# Patient Record
Sex: Male | Born: 1960 | Race: Black or African American | Hispanic: No | Marital: Single | Smoking: Current every day smoker
Health system: Southern US, Community
[De-identification: ages and names within clinical notes are randomized; demographics above are authoritative.]

## PROBLEM LIST (undated history)

## (undated) DIAGNOSIS — Z21 Asymptomatic human immunodeficiency virus [HIV] infection status: Secondary | ICD-10-CM

## (undated) DIAGNOSIS — B20 Human immunodeficiency virus [HIV] disease: Secondary | ICD-10-CM

## (undated) DIAGNOSIS — N400 Enlarged prostate without lower urinary tract symptoms: Secondary | ICD-10-CM

## (undated) DIAGNOSIS — I10 Essential (primary) hypertension: Secondary | ICD-10-CM

## (undated) DIAGNOSIS — E785 Hyperlipidemia, unspecified: Secondary | ICD-10-CM

## (undated) DIAGNOSIS — F259 Schizoaffective disorder, unspecified: Secondary | ICD-10-CM

## (undated) DIAGNOSIS — E039 Hypothyroidism, unspecified: Secondary | ICD-10-CM

---

## 2006-09-21 ENCOUNTER — Emergency Department: Payer: Self-pay | Admitting: Emergency Medicine

## 2011-02-10 ENCOUNTER — Emergency Department: Payer: Self-pay | Admitting: Emergency Medicine

## 2011-04-24 ENCOUNTER — Emergency Department: Payer: Self-pay | Admitting: Emergency Medicine

## 2011-04-24 LAB — COMPREHENSIVE METABOLIC PANEL
Albumin: 3.9 g/dL (ref 3.4–5.0)
Alkaline Phosphatase: 106 U/L (ref 50–136)
Anion Gap: 7 (ref 7–16)
BUN: 21 mg/dL — ABNORMAL HIGH (ref 7–18)
Calcium, Total: 8.3 mg/dL — ABNORMAL LOW (ref 8.5–10.1)
Creatinine: 0.97 mg/dL (ref 0.60–1.30)
Glucose: 106 mg/dL — ABNORMAL HIGH (ref 65–99)
Potassium: 3.7 mmol/L (ref 3.5–5.1)
SGOT(AST): 17 U/L (ref 15–37)
Sodium: 143 mmol/L (ref 136–145)
Total Protein: 6.7 g/dL (ref 6.4–8.2)

## 2011-04-24 LAB — URINALYSIS, COMPLETE
Bacteria: NONE SEEN
Bilirubin,UR: NEGATIVE
Blood: NEGATIVE
Glucose,UR: NEGATIVE mg/dL (ref 0–75)
Leukocyte Esterase: NEGATIVE
Nitrite: NEGATIVE
Specific Gravity: 1.024 (ref 1.003–1.030)
Squamous Epithelial: <1
WBC UR: NONE SEEN /HPF (ref 0–5)

## 2011-04-24 LAB — CBC
HGB: 13.7 g/dL (ref 13.0–18.0)
MCHC: 33.3 g/dL (ref 32.0–36.0)
RBC: 3.93 10*6/uL — ABNORMAL LOW (ref 4.40–5.90)
WBC: 2.8 10*3/uL — ABNORMAL LOW (ref 3.8–10.6)

## 2011-04-24 LAB — DRUG SCREEN, URINE
Amphetamines, Ur Screen: NEGATIVE (ref ?–1000)
Barbiturates, Ur Screen: NEGATIVE (ref ?–200)
Cocaine Metabolite,Ur ~~LOC~~: NEGATIVE (ref ?–300)
MDMA (Ecstasy)Ur Screen: NEGATIVE (ref ?–500)
Methadone, Ur Screen: NEGATIVE (ref ?–300)
Opiate, Ur Screen: NEGATIVE (ref ?–300)
Tricyclic, Ur Screen: NEGATIVE (ref ?–1000)

## 2011-04-24 LAB — ACETAMINOPHEN LEVEL: Acetaminophen: <2 ug/mL

## 2011-04-24 LAB — ETHANOL
Ethanol %: <0.003 % (ref 0.000–0.080)
Ethanol: <3 mg/dL

## 2011-04-24 LAB — TSH: Thyroid Stimulating Horm: 0.63 u[IU]/mL

## 2011-05-21 ENCOUNTER — Emergency Department: Payer: Self-pay | Admitting: Emergency Medicine

## 2011-05-21 LAB — COMPREHENSIVE METABOLIC PANEL
Anion Gap: 11 (ref 7–16)
BUN: 21 mg/dL — ABNORMAL HIGH (ref 7–18)
Calcium, Total: 8.7 mg/dL (ref 8.5–10.1)
Chloride: 106 mmol/L (ref 98–107)
Co2: 28 mmol/L (ref 21–32)
EGFR (Non-African Amer.): >60
Potassium: 4.1 mmol/L (ref 3.5–5.1)
SGOT(AST): 13 U/L — ABNORMAL LOW (ref 15–37)
SGPT (ALT): 8 U/L — ABNORMAL LOW
Sodium: 145 mmol/L (ref 136–145)
Total Protein: 6.8 g/dL (ref 6.4–8.2)

## 2011-05-21 LAB — DRUG SCREEN, URINE
Amphetamines, Ur Screen: NEGATIVE (ref ?–1000)
Cocaine Metabolite,Ur ~~LOC~~: NEGATIVE (ref ?–300)
MDMA (Ecstasy)Ur Screen: POSITIVE (ref ?–500)
Methadone, Ur Screen: NEGATIVE (ref ?–300)
Opiate, Ur Screen: NEGATIVE (ref ?–300)
Tricyclic, Ur Screen: NEGATIVE (ref ?–1000)

## 2011-05-21 LAB — ETHANOL: Ethanol: <3 mg/dL

## 2011-05-21 LAB — SALICYLATE LEVEL: Salicylates, Serum: 2.2 mg/dL

## 2011-05-21 LAB — CBC
HCT: 37.6 % — ABNORMAL LOW (ref 40.0–52.0)
HGB: 12.4 g/dL — ABNORMAL LOW (ref 13.0–18.0)
MCV: 105 fL — ABNORMAL HIGH (ref 80–100)
RBC: 3.57 10*6/uL — ABNORMAL LOW (ref 4.40–5.90)
WBC: 6.2 10*3/uL (ref 3.8–10.6)

## 2011-05-21 LAB — ACETAMINOPHEN LEVEL: Acetaminophen: <2 ug/mL

## 2011-05-21 LAB — VALPROIC ACID LEVEL: Valproic Acid: 72 ug/mL

## 2011-06-10 ENCOUNTER — Emergency Department: Payer: Self-pay | Admitting: Emergency Medicine

## 2011-06-10 LAB — COMPREHENSIVE METABOLIC PANEL
Alkaline Phosphatase: 104 U/L (ref 50–136)
Bilirubin,Total: 0.2 mg/dL (ref 0.2–1.0)
Calcium, Total: 8.3 mg/dL — ABNORMAL LOW (ref 8.5–10.1)
Chloride: 109 mmol/L — ABNORMAL HIGH (ref 98–107)
Co2: 25 mmol/L (ref 21–32)
Creatinine: 0.9 mg/dL (ref 0.60–1.30)
EGFR (African American): >60
EGFR (Non-African Amer.): >60
SGPT (ALT): 8 U/L — ABNORMAL LOW
Total Protein: 7 g/dL (ref 6.4–8.2)

## 2011-06-10 LAB — DRUG SCREEN, URINE
Barbiturates, Ur Screen: NEGATIVE (ref ?–200)
Benzodiazepine, Ur Scrn: NEGATIVE (ref ?–200)
Cannabinoid 50 Ng, Ur ~~LOC~~: NEGATIVE (ref ?–50)
Methadone, Ur Screen: NEGATIVE (ref ?–300)
Opiate, Ur Screen: NEGATIVE (ref ?–300)
Phencyclidine (PCP) Ur S: NEGATIVE (ref ?–25)
Tricyclic, Ur Screen: NEGATIVE (ref ?–1000)

## 2011-06-10 LAB — CBC
HCT: 38.1 % — ABNORMAL LOW (ref 40.0–52.0)
HGB: 12.8 g/dL — ABNORMAL LOW (ref 13.0–18.0)
MCH: 34.4 pg — ABNORMAL HIGH (ref 26.0–34.0)
MCHC: 33.5 g/dL (ref 32.0–36.0)
RBC: 3.71 10*6/uL — ABNORMAL LOW (ref 4.40–5.90)

## 2011-06-10 LAB — ACETAMINOPHEN LEVEL: Acetaminophen: <2 ug/mL

## 2011-06-10 LAB — TSH: Thyroid Stimulating Horm: 0.41 u[IU]/mL — ABNORMAL LOW

## 2011-06-10 LAB — SALICYLATE LEVEL: Salicylates, Serum: <1.7 mg/dL

## 2011-06-29 ENCOUNTER — Emergency Department: Payer: Self-pay | Admitting: Emergency Medicine

## 2011-06-30 LAB — DRUG SCREEN, URINE
Barbiturates, Ur Screen: NEGATIVE (ref ?–200)
Benzodiazepine, Ur Scrn: NEGATIVE (ref ?–200)
Cocaine Metabolite,Ur ~~LOC~~: NEGATIVE (ref ?–300)
MDMA (Ecstasy)Ur Screen: POSITIVE (ref ?–500)
Methadone, Ur Screen: NEGATIVE (ref ?–300)
Phencyclidine (PCP) Ur S: NEGATIVE (ref ?–25)
Tricyclic, Ur Screen: NEGATIVE (ref ?–1000)

## 2011-06-30 LAB — COMPREHENSIVE METABOLIC PANEL
Albumin: 3.3 g/dL — ABNORMAL LOW (ref 3.4–5.0)
Chloride: 110 mmol/L — ABNORMAL HIGH (ref 98–107)
Co2: 26 mmol/L (ref 21–32)
Creatinine: 1.06 mg/dL (ref 0.60–1.30)
EGFR (African American): >60
Glucose: 123 mg/dL — ABNORMAL HIGH (ref 65–99)
SGOT(AST): 22 U/L (ref 15–37)
Sodium: 144 mmol/L (ref 136–145)
Total Protein: 5.9 g/dL — ABNORMAL LOW (ref 6.4–8.2)

## 2011-06-30 LAB — SALICYLATE LEVEL: Salicylates, Serum: <1.7 mg/dL

## 2011-06-30 LAB — VALPROIC ACID LEVEL: Valproic Acid: 6 ug/mL — ABNORMAL LOW

## 2011-06-30 LAB — TSH: Thyroid Stimulating Horm: 1.49 u[IU]/mL

## 2011-06-30 LAB — ETHANOL: Ethanol: <3 mg/dL

## 2011-06-30 LAB — CBC
HCT: 34.1 % — ABNORMAL LOW (ref 40.0–52.0)
HGB: 11.5 g/dL — ABNORMAL LOW (ref 13.0–18.0)
MCHC: 33.8 g/dL (ref 32.0–36.0)
MCV: 103 fL — ABNORMAL HIGH (ref 80–100)
RBC: 3.31 10*6/uL — ABNORMAL LOW (ref 4.40–5.90)
WBC: 4.4 10*3/uL (ref 3.8–10.6)

## 2011-06-30 LAB — ACETAMINOPHEN LEVEL: Acetaminophen: 8 ug/mL — ABNORMAL LOW

## 2011-07-09 ENCOUNTER — Emergency Department: Payer: Self-pay | Admitting: Emergency Medicine

## 2011-07-09 LAB — URINALYSIS, COMPLETE
Bacteria: NONE SEEN
Blood: NEGATIVE
Glucose,UR: NEGATIVE mg/dL (ref 0–75)
Nitrite: NEGATIVE
Ph: 5 (ref 4.5–8.0)
Protein: NEGATIVE
Specific Gravity: 1.021 (ref 1.003–1.030)
Squamous Epithelial: <1
WBC UR: 27 /HPF (ref 0–5)

## 2011-07-09 LAB — DRUG SCREEN, URINE
Barbiturates, Ur Screen: NEGATIVE (ref ?–200)
Cocaine Metabolite,Ur ~~LOC~~: NEGATIVE (ref ?–300)
Methadone, Ur Screen: NEGATIVE (ref ?–300)

## 2011-07-09 LAB — CBC
HGB: 13 g/dL (ref 13.0–18.0)
MCH: 33.6 pg (ref 26.0–34.0)
MCHC: 32.5 g/dL (ref 32.0–36.0)
Platelet: 218 10*3/uL (ref 150–440)
RBC: 3.86 10*6/uL — ABNORMAL LOW (ref 4.40–5.90)
RDW: 14.7 % — ABNORMAL HIGH (ref 11.5–14.5)
WBC: 5.2 10*3/uL (ref 3.8–10.6)

## 2011-07-09 LAB — COMPREHENSIVE METABOLIC PANEL
Albumin: 3.7 g/dL (ref 3.4–5.0)
Alkaline Phosphatase: 123 U/L (ref 50–136)
Bilirubin,Total: 0.2 mg/dL (ref 0.2–1.0)
Calcium, Total: 8.4 mg/dL — ABNORMAL LOW (ref 8.5–10.1)
Chloride: 104 mmol/L (ref 98–107)
Co2: 27 mmol/L (ref 21–32)
Creatinine: 1.02 mg/dL (ref 0.60–1.30)
EGFR (African American): >60
EGFR (Non-African Amer.): >60
Glucose: 124 mg/dL — ABNORMAL HIGH (ref 65–99)
Osmolality: 281 (ref 275–301)
Potassium: 4.1 mmol/L (ref 3.5–5.1)
SGPT (ALT): 8 U/L — ABNORMAL LOW
Sodium: 139 mmol/L (ref 136–145)
Total Protein: 7 g/dL (ref 6.4–8.2)

## 2011-07-09 LAB — VALPROIC ACID LEVEL: Valproic Acid: 85 ug/mL

## 2011-07-09 LAB — ETHANOL
Ethanol %: <0.003 % (ref 0.000–0.080)
Ethanol: <3 mg/dL

## 2011-07-10 ENCOUNTER — Emergency Department: Payer: Self-pay | Admitting: Emergency Medicine

## 2011-07-10 LAB — COMPREHENSIVE METABOLIC PANEL
Albumin: 3.7 g/dL (ref 3.4–5.0)
Anion Gap: 5 — ABNORMAL LOW (ref 7–16)
BUN: 24 mg/dL — ABNORMAL HIGH (ref 7–18)
Bilirubin,Total: 0.2 mg/dL (ref 0.2–1.0)
Calcium, Total: 8.4 mg/dL — ABNORMAL LOW (ref 8.5–10.1)
Creatinine: 0.93 mg/dL (ref 0.60–1.30)
EGFR (African American): >60
EGFR (Non-African Amer.): >60
Potassium: 4.4 mmol/L (ref 3.5–5.1)
SGPT (ALT): 10 U/L — ABNORMAL LOW
Sodium: 139 mmol/L (ref 136–145)
Total Protein: 7.2 g/dL (ref 6.4–8.2)

## 2011-07-10 LAB — CBC
MCH: 33.8 pg (ref 26.0–34.0)
MCHC: 32.5 g/dL (ref 32.0–36.0)
MCV: 104 fL — ABNORMAL HIGH (ref 80–100)
Platelet: 238 10*3/uL (ref 150–440)
WBC: 4.2 10*3/uL (ref 3.8–10.6)

## 2011-07-10 LAB — ETHANOL: Ethanol %: <0.003 % (ref 0.000–0.080)

## 2011-07-10 LAB — DRUG SCREEN, URINE
Amphetamines, Ur Screen: NEGATIVE (ref ?–1000)
Benzodiazepine, Ur Scrn: NEGATIVE (ref ?–200)
Cannabinoid 50 Ng, Ur ~~LOC~~: NEGATIVE (ref ?–50)
Cocaine Metabolite,Ur ~~LOC~~: NEGATIVE (ref ?–300)
MDMA (Ecstasy)Ur Screen: NEGATIVE (ref ?–500)
Methadone, Ur Screen: NEGATIVE (ref ?–300)
Phencyclidine (PCP) Ur S: NEGATIVE (ref ?–25)
Tricyclic, Ur Screen: NEGATIVE (ref ?–1000)

## 2011-07-10 LAB — ACETAMINOPHEN LEVEL: Acetaminophen: <2 ug/mL

## 2011-07-10 LAB — SALICYLATE LEVEL: Salicylates, Serum: <1.7 mg/dL

## 2011-07-10 LAB — TSH: Thyroid Stimulating Horm: 0.53 u[IU]/mL

## 2011-09-03 ENCOUNTER — Emergency Department: Payer: Self-pay | Admitting: Emergency Medicine

## 2011-09-03 LAB — DRUG SCREEN, URINE
Amphetamines, Ur Screen: NEGATIVE (ref ?–1000)
Cocaine Metabolite,Ur ~~LOC~~: NEGATIVE (ref ?–300)
MDMA (Ecstasy)Ur Screen: POSITIVE (ref ?–500)
Phencyclidine (PCP) Ur S: NEGATIVE (ref ?–25)
Tricyclic, Ur Screen: NEGATIVE (ref ?–1000)

## 2011-09-03 LAB — CBC
HCT: 40 % (ref 40.0–52.0)
HGB: 13.2 g/dL (ref 13.0–18.0)
MCH: 34.4 pg — ABNORMAL HIGH (ref 26.0–34.0)
MCHC: 33 g/dL (ref 32.0–36.0)
Platelet: 209 10*3/uL (ref 150–440)
RBC: 3.83 10*6/uL — ABNORMAL LOW (ref 4.40–5.90)
RDW: 14 % (ref 11.5–14.5)
WBC: 4.3 10*3/uL (ref 3.8–10.6)

## 2011-09-03 LAB — ETHANOL: Ethanol %: <0.003 % (ref 0.000–0.080)

## 2011-09-03 LAB — SALICYLATE LEVEL: Salicylates, Serum: <1.7 mg/dL

## 2011-09-03 LAB — COMPREHENSIVE METABOLIC PANEL
Alkaline Phosphatase: 116 U/L (ref 50–136)
Anion Gap: 4 — ABNORMAL LOW (ref 7–16)
BUN: 13 mg/dL (ref 7–18)
Calcium, Total: 8.5 mg/dL (ref 8.5–10.1)
Chloride: 109 mmol/L — ABNORMAL HIGH (ref 98–107)
Creatinine: 1.01 mg/dL (ref 0.60–1.30)
Osmolality: 279 (ref 275–301)
SGPT (ALT): 11 U/L — ABNORMAL LOW
Sodium: 140 mmol/L (ref 136–145)
Total Protein: 6.5 g/dL (ref 6.4–8.2)

## 2011-09-03 LAB — TSH: Thyroid Stimulating Horm: 0.951 u[IU]/mL

## 2011-09-30 ENCOUNTER — Emergency Department: Payer: Self-pay | Admitting: Emergency Medicine

## 2011-09-30 LAB — COMPREHENSIVE METABOLIC PANEL
Albumin: 3.4 g/dL (ref 3.4–5.0)
Anion Gap: 8 (ref 7–16)
BUN: 14 mg/dL (ref 7–18)
Chloride: 108 mmol/L — ABNORMAL HIGH (ref 98–107)
Co2: 29 mmol/L (ref 21–32)
EGFR (Non-African Amer.): >60
Osmolality: 290 (ref 275–301)
Potassium: 3.8 mmol/L (ref 3.5–5.1)
SGPT (ALT): 8 U/L — ABNORMAL LOW
Sodium: 145 mmol/L (ref 136–145)

## 2011-09-30 LAB — CBC
HCT: 40.3 % (ref 40.0–52.0)
MCHC: 33.3 g/dL (ref 32.0–36.0)
MCV: 102 fL — ABNORMAL HIGH (ref 80–100)
Platelet: 219 10*3/uL (ref 150–440)
RDW: 13.5 % (ref 11.5–14.5)
WBC: 3.7 10*3/uL — ABNORMAL LOW (ref 3.8–10.6)

## 2011-09-30 LAB — ETHANOL
Ethanol %: <0.003 % (ref 0.000–0.080)
Ethanol: <3 mg/dL

## 2011-09-30 LAB — URINALYSIS, COMPLETE
Bilirubin,UR: NEGATIVE
Ph: 6 (ref 4.5–8.0)
Protein: NEGATIVE
RBC,UR: 3 /HPF (ref 0–5)
Specific Gravity: 1.016 (ref 1.003–1.030)
Squamous Epithelial: <1
WBC UR: <1 /HPF (ref 0–5)

## 2011-09-30 LAB — DRUG SCREEN, URINE
Amphetamines, Ur Screen: NEGATIVE (ref ?–1000)
Benzodiazepine, Ur Scrn: NEGATIVE (ref ?–200)
Cannabinoid 50 Ng, Ur ~~LOC~~: NEGATIVE (ref ?–50)
Cocaine Metabolite,Ur ~~LOC~~: NEGATIVE (ref ?–300)
Methadone, Ur Screen: NEGATIVE (ref ?–300)
Opiate, Ur Screen: NEGATIVE (ref ?–300)
Phencyclidine (PCP) Ur S: NEGATIVE (ref ?–25)

## 2011-09-30 LAB — LITHIUM LEVEL: Lithium: <0.2 mmol/L — ABNORMAL LOW

## 2011-09-30 LAB — VALPROIC ACID LEVEL: Valproic Acid: 74 ug/mL

## 2011-10-06 LAB — BASIC METABOLIC PANEL
Anion Gap: 8 (ref 7–16)
Calcium, Total: 8.8 mg/dL (ref 8.5–10.1)
Chloride: 105 mmol/L (ref 98–107)
Co2: 29 mmol/L (ref 21–32)
EGFR (African American): >60
EGFR (Non-African Amer.): >60
Glucose: 96 mg/dL (ref 65–99)
Osmolality: 283 (ref 275–301)
Sodium: 142 mmol/L (ref 136–145)

## 2011-10-13 LAB — LITHIUM LEVEL: Lithium: 0.55 mmol/L — ABNORMAL LOW

## 2011-10-13 LAB — BASIC METABOLIC PANEL
BUN: 15 mg/dL (ref 7–18)
Calcium, Total: 8.8 mg/dL (ref 8.5–10.1)
Chloride: 109 mmol/L — ABNORMAL HIGH (ref 98–107)
Creatinine: 1 mg/dL (ref 0.60–1.30)
EGFR (African American): >60
EGFR (Non-African Amer.): >60
Glucose: 72 mg/dL (ref 65–99)
Potassium: 4.1 mmol/L (ref 3.5–5.1)
Sodium: 142 mmol/L (ref 136–145)

## 2011-10-13 LAB — VALPROIC ACID LEVEL: Valproic Acid: 59 ug/mL

## 2011-10-21 LAB — BASIC METABOLIC PANEL
BUN: 14 mg/dL (ref 7–18)
Calcium, Total: 9 mg/dL (ref 8.5–10.1)
Creatinine: 1.09 mg/dL (ref 0.60–1.30)
EGFR (African American): >60
EGFR (Non-African Amer.): >60
Glucose: 87 mg/dL (ref 65–99)
Potassium: 4.1 mmol/L (ref 3.5–5.1)
Sodium: 144 mmol/L (ref 136–145)

## 2011-10-21 LAB — LITHIUM LEVEL: Lithium: 0.59 mmol/L — ABNORMAL LOW

## 2012-02-04 ENCOUNTER — Emergency Department: Payer: Self-pay | Admitting: Emergency Medicine

## 2012-02-05 LAB — CBC
HCT: 37.7 % — ABNORMAL LOW (ref 40.0–52.0)
HGB: 12.9 g/dL — ABNORMAL LOW (ref 13.0–18.0)
MCH: 35.4 pg — ABNORMAL HIGH (ref 26.0–34.0)
MCHC: 34.2 g/dL (ref 32.0–36.0)
MCV: 103 fL — ABNORMAL HIGH (ref 80–100)
Platelet: 190 10*3/uL (ref 150–440)
RBC: 3.65 10*6/uL — ABNORMAL LOW (ref 4.40–5.90)
RDW: 13.1 % (ref 11.5–14.5)
WBC: 4.5 10*3/uL (ref 3.8–10.6)

## 2012-02-05 LAB — ETHANOL
Ethanol %: <0.003 % (ref 0.000–0.080)
Ethanol: <3 mg/dL

## 2012-02-05 LAB — COMPREHENSIVE METABOLIC PANEL
Albumin: 3 g/dL — ABNORMAL LOW (ref 3.4–5.0)
Anion Gap: 4 — ABNORMAL LOW (ref 7–16)
BUN: 8 mg/dL (ref 7–18)
Bilirubin,Total: 0.2 mg/dL (ref 0.2–1.0)
Chloride: 109 mmol/L — ABNORMAL HIGH (ref 98–107)
Co2: 28 mmol/L (ref 21–32)
Creatinine: 0.91 mg/dL (ref 0.60–1.30)
EGFR (African American): >60
EGFR (Non-African Amer.): >60
Osmolality: 280 (ref 275–301)
Potassium: 3.6 mmol/L (ref 3.5–5.1)
SGPT (ALT): 8 U/L — ABNORMAL LOW (ref 12–78)

## 2012-02-05 LAB — DRUG SCREEN, URINE
Amphetamines, Ur Screen: NEGATIVE (ref ?–1000)
Barbiturates, Ur Screen: NEGATIVE (ref ?–200)
Benzodiazepine, Ur Scrn: NEGATIVE (ref ?–200)
MDMA (Ecstasy)Ur Screen: NEGATIVE (ref ?–500)
Methadone, Ur Screen: NEGATIVE (ref ?–300)
Phencyclidine (PCP) Ur S: NEGATIVE (ref ?–25)
Tricyclic, Ur Screen: NEGATIVE (ref ?–1000)

## 2012-02-05 LAB — LITHIUM LEVEL: Lithium: 0.48 mmol/L — ABNORMAL LOW

## 2012-02-05 LAB — SALICYLATE LEVEL: Salicylates, Serum: 2.1 mg/dL

## 2012-02-05 LAB — VALPROIC ACID LEVEL: Valproic Acid: 29 ug/mL — ABNORMAL LOW

## 2012-02-05 LAB — ACETAMINOPHEN LEVEL: Acetaminophen: 2 ug/mL — ABNORMAL LOW

## 2012-05-27 ENCOUNTER — Emergency Department: Payer: Self-pay | Admitting: Emergency Medicine

## 2012-05-27 LAB — DRUG SCREEN, URINE
Amphetamines, Ur Screen: NEGATIVE (ref ?–1000)
Benzodiazepine, Ur Scrn: POSITIVE (ref ?–200)
Cannabinoid 50 Ng, Ur ~~LOC~~: NEGATIVE (ref ?–50)
Cocaine Metabolite,Ur ~~LOC~~: NEGATIVE (ref ?–300)
MDMA (Ecstasy)Ur Screen: NEGATIVE (ref ?–500)
Methadone, Ur Screen: NEGATIVE (ref ?–300)
Opiate, Ur Screen: NEGATIVE (ref ?–300)
Phencyclidine (PCP) Ur S: NEGATIVE (ref ?–25)

## 2012-05-27 LAB — CBC
HCT: 41.3 % (ref 40.0–52.0)
HGB: 13.7 g/dL (ref 13.0–18.0)
MCV: 107 fL — ABNORMAL HIGH (ref 80–100)
Platelet: 216 10*3/uL (ref 150–440)
RBC: 3.86 10*6/uL — ABNORMAL LOW (ref 4.40–5.90)
RDW: 13.7 % (ref 11.5–14.5)
WBC: 4.1 10*3/uL (ref 3.8–10.6)

## 2012-05-27 LAB — TSH: Thyroid Stimulating Horm: 2.19 u[IU]/mL

## 2012-05-27 LAB — URINALYSIS, COMPLETE
Bacteria: NONE SEEN
Blood: NEGATIVE
Glucose,UR: NEGATIVE mg/dL (ref 0–75)
Ketone: NEGATIVE
Leukocyte Esterase: NEGATIVE
Nitrite: NEGATIVE
Ph: 7 (ref 4.5–8.0)
Protein: NEGATIVE
RBC,UR: <1 /HPF (ref 0–5)
Specific Gravity: 1.01 (ref 1.003–1.030)

## 2012-05-27 LAB — COMPREHENSIVE METABOLIC PANEL
Alkaline Phosphatase: 110 U/L (ref 50–136)
Chloride: 106 mmol/L (ref 98–107)
Creatinine: 1.07 mg/dL (ref 0.60–1.30)
EGFR (African American): >60
Potassium: 3.4 mmol/L — ABNORMAL LOW (ref 3.5–5.1)
SGOT(AST): 15 U/L (ref 15–37)
SGPT (ALT): 10 U/L — ABNORMAL LOW (ref 12–78)
Sodium: 139 mmol/L (ref 136–145)
Total Protein: 7 g/dL (ref 6.4–8.2)

## 2012-06-17 ENCOUNTER — Emergency Department: Payer: Self-pay | Admitting: Emergency Medicine

## 2012-06-17 LAB — DRUG SCREEN, URINE
Barbiturates, Ur Screen: NEGATIVE (ref ?–200)
Cannabinoid 50 Ng, Ur ~~LOC~~: NEGATIVE (ref ?–50)
MDMA (Ecstasy)Ur Screen: NEGATIVE (ref ?–500)
Methadone, Ur Screen: NEGATIVE (ref ?–300)
Phencyclidine (PCP) Ur S: NEGATIVE (ref ?–25)
Tricyclic, Ur Screen: NEGATIVE (ref ?–1000)

## 2012-06-17 LAB — ETHANOL
Ethanol %: <0.003 % (ref 0.000–0.080)
Ethanol: <3 mg/dL

## 2012-06-17 LAB — COMPREHENSIVE METABOLIC PANEL
Albumin: 3.5 g/dL (ref 3.4–5.0)
Anion Gap: 3 — ABNORMAL LOW (ref 7–16)
Bilirubin,Total: 0.3 mg/dL (ref 0.2–1.0)
Calcium, Total: 8.4 mg/dL — ABNORMAL LOW (ref 8.5–10.1)
Co2: 29 mmol/L (ref 21–32)
Creatinine: 0.91 mg/dL (ref 0.60–1.30)
EGFR (Non-African Amer.): >60
SGPT (ALT): 11 U/L — ABNORMAL LOW (ref 12–78)
Sodium: 142 mmol/L (ref 136–145)
Total Protein: 6.8 g/dL (ref 6.4–8.2)

## 2012-06-17 LAB — URINALYSIS, COMPLETE
Bilirubin,UR: NEGATIVE
Blood: NEGATIVE
Nitrite: NEGATIVE
Squamous Epithelial: <1
WBC UR: 1 /HPF (ref 0–5)

## 2012-06-17 LAB — CBC
HCT: 39.9 % — ABNORMAL LOW (ref 40.0–52.0)
HGB: 13.5 g/dL (ref 13.0–18.0)
RBC: 3.76 10*6/uL — ABNORMAL LOW (ref 4.40–5.90)
RDW: 12.9 % (ref 11.5–14.5)
WBC: 5.3 10*3/uL (ref 3.8–10.6)

## 2012-06-19 ENCOUNTER — Emergency Department: Payer: Self-pay | Admitting: Emergency Medicine

## 2012-06-19 LAB — DRUG SCREEN, URINE
Amphetamines, Ur Screen: NEGATIVE (ref ?–1000)
Barbiturates, Ur Screen: NEGATIVE (ref ?–200)
Benzodiazepine, Ur Scrn: POSITIVE (ref ?–200)
Cocaine Metabolite,Ur ~~LOC~~: NEGATIVE (ref ?–300)
MDMA (Ecstasy)Ur Screen: NEGATIVE (ref ?–500)
Methadone, Ur Screen: NEGATIVE (ref ?–300)
Opiate, Ur Screen: NEGATIVE (ref ?–300)
Phencyclidine (PCP) Ur S: NEGATIVE (ref ?–25)

## 2012-06-19 LAB — COMPREHENSIVE METABOLIC PANEL
Alkaline Phosphatase: 93 U/L (ref 50–136)
Anion Gap: 3 — ABNORMAL LOW (ref 7–16)
Bilirubin,Total: 0.3 mg/dL (ref 0.2–1.0)
Calcium, Total: 8.6 mg/dL (ref 8.5–10.1)
Chloride: 108 mmol/L — ABNORMAL HIGH (ref 98–107)
Co2: 29 mmol/L (ref 21–32)
Creatinine: 0.9 mg/dL (ref 0.60–1.30)
EGFR (African American): >60
EGFR (Non-African Amer.): >60
Glucose: 104 mg/dL — ABNORMAL HIGH (ref 65–99)
Osmolality: 280 (ref 275–301)
Potassium: 3.8 mmol/L (ref 3.5–5.1)
SGOT(AST): 15 U/L (ref 15–37)
Total Protein: 7.2 g/dL (ref 6.4–8.2)

## 2012-06-19 LAB — URINALYSIS, COMPLETE
Bilirubin,UR: NEGATIVE
Glucose,UR: NEGATIVE mg/dL (ref 0–75)
Specific Gravity: 1.015 (ref 1.003–1.030)
WBC UR: 4 /HPF (ref 0–5)

## 2012-06-19 LAB — CBC
MCH: 35.1 pg — ABNORMAL HIGH (ref 26.0–34.0)
MCV: 106 fL — ABNORMAL HIGH (ref 80–100)
Platelet: 205 10*3/uL (ref 150–440)
RBC: 3.82 10*6/uL — ABNORMAL LOW (ref 4.40–5.90)
RDW: 12.8 % (ref 11.5–14.5)
WBC: 6.5 10*3/uL (ref 3.8–10.6)

## 2012-09-27 ENCOUNTER — Emergency Department: Payer: Self-pay | Admitting: Emergency Medicine

## 2012-09-27 LAB — COMPREHENSIVE METABOLIC PANEL
Albumin: 3.4 g/dL (ref 3.4–5.0)
BUN: 8 mg/dL (ref 7–18)
Bilirubin,Total: 0.2 mg/dL (ref 0.2–1.0)
Calcium, Total: 8.7 mg/dL (ref 8.5–10.1)
Co2: 29 mmol/L (ref 21–32)
Creatinine: 0.92 mg/dL (ref 0.60–1.30)
EGFR (African American): >60
EGFR (Non-African Amer.): >60
Osmolality: 283 (ref 275–301)
Potassium: 3.9 mmol/L (ref 3.5–5.1)
Sodium: 142 mmol/L (ref 136–145)

## 2012-09-27 LAB — URINALYSIS, COMPLETE
Bilirubin,UR: NEGATIVE
Glucose,UR: NEGATIVE mg/dL (ref 0–75)
Ketone: NEGATIVE
Leukocyte Esterase: NEGATIVE
Ph: 7 (ref 4.5–8.0)
Specific Gravity: 1.008 (ref 1.003–1.030)
Squamous Epithelial: <1

## 2012-09-27 LAB — DRUG SCREEN, URINE
Amphetamines, Ur Screen: NEGATIVE (ref ?–1000)
Barbiturates, Ur Screen: NEGATIVE (ref ?–200)
Cocaine Metabolite,Ur ~~LOC~~: NEGATIVE (ref ?–300)
MDMA (Ecstasy)Ur Screen: NEGATIVE (ref ?–500)
Methadone, Ur Screen: NEGATIVE (ref ?–300)
Phencyclidine (PCP) Ur S: NEGATIVE (ref ?–25)
Tricyclic, Ur Screen: NEGATIVE (ref ?–1000)

## 2012-09-27 LAB — ETHANOL: Ethanol: <3 mg/dL

## 2012-09-27 LAB — CBC
HCT: 38.9 % — ABNORMAL LOW (ref 40.0–52.0)
HGB: 13 g/dL (ref 13.0–18.0)
MCH: 34.5 pg — ABNORMAL HIGH (ref 26.0–34.0)
RBC: 3.78 10*6/uL — ABNORMAL LOW (ref 4.40–5.90)

## 2012-09-28 LAB — VALPROIC ACID LEVEL: Valproic Acid: 74 ug/mL

## 2012-10-05 ENCOUNTER — Emergency Department: Payer: Self-pay | Admitting: Emergency Medicine

## 2012-10-05 LAB — CBC
HGB: 13.9 g/dL (ref 13.0–18.0)
MCH: 34.7 pg — ABNORMAL HIGH (ref 26.0–34.0)
MCHC: 33.9 g/dL (ref 32.0–36.0)
Platelet: 236 10*3/uL (ref 150–440)
RBC: 4 10*6/uL — ABNORMAL LOW (ref 4.40–5.90)

## 2012-10-05 LAB — COMPREHENSIVE METABOLIC PANEL
Albumin: 4.1 g/dL (ref 3.4–5.0)
Alkaline Phosphatase: 84 U/L (ref 50–136)
BUN: 11 mg/dL (ref 7–18)
Bilirubin,Total: 0.8 mg/dL (ref 0.2–1.0)
Calcium, Total: 9 mg/dL (ref 8.5–10.1)
Co2: 28 mmol/L (ref 21–32)
Creatinine: 1.02 mg/dL (ref 0.60–1.30)
EGFR (Non-African Amer.): >60
Glucose: 83 mg/dL (ref 65–99)
Sodium: 136 mmol/L (ref 136–145)

## 2012-10-05 LAB — DRUG SCREEN, URINE
Barbiturates, Ur Screen: NEGATIVE (ref ?–200)
Benzodiazepine, Ur Scrn: POSITIVE (ref ?–200)
MDMA (Ecstasy)Ur Screen: NEGATIVE (ref ?–500)
Methadone, Ur Screen: NEGATIVE (ref ?–300)
Opiate, Ur Screen: NEGATIVE (ref ?–300)
Tricyclic, Ur Screen: NEGATIVE (ref ?–1000)

## 2012-10-05 LAB — ACETAMINOPHEN LEVEL: Acetaminophen: <2 ug/mL

## 2012-10-05 LAB — ETHANOL
Ethanol %: <0.003 % (ref 0.000–0.080)
Ethanol: <3 mg/dL

## 2012-10-05 LAB — SALICYLATE LEVEL: Salicylates, Serum: <1.7 mg/dL

## 2013-03-23 ENCOUNTER — Emergency Department: Payer: Self-pay | Admitting: Emergency Medicine

## 2013-03-23 LAB — COMPREHENSIVE METABOLIC PANEL
ALBUMIN: 3.6 g/dL (ref 3.4–5.0)
ALK PHOS: 100 U/L
ALT: 11 U/L — AB (ref 12–78)
ANION GAP: 4 — AB (ref 7–16)
BILIRUBIN TOTAL: 0.2 mg/dL (ref 0.2–1.0)
BUN: 11 mg/dL (ref 7–18)
CHLORIDE: 106 mmol/L (ref 98–107)
CO2: 28 mmol/L (ref 21–32)
Calcium, Total: 9 mg/dL (ref 8.5–10.1)
Creatinine: 1.11 mg/dL (ref 0.60–1.30)
Glucose: 104 mg/dL — ABNORMAL HIGH (ref 65–99)
Osmolality: 275 (ref 275–301)
Potassium: 3.9 mmol/L (ref 3.5–5.1)
SGOT(AST): 19 U/L (ref 15–37)
Sodium: 138 mmol/L (ref 136–145)
Total Protein: 7 g/dL (ref 6.4–8.2)

## 2013-03-23 LAB — SALICYLATE LEVEL: Salicylates, Serum: 2 mg/dL

## 2013-03-23 LAB — DRUG SCREEN, URINE

## 2013-03-23 LAB — CBC
HCT: 41.5 % (ref 40.0–52.0)
HGB: 14 g/dL (ref 13.0–18.0)
MCH: 35.6 pg — ABNORMAL HIGH (ref 26.0–34.0)
MCHC: 33.9 g/dL (ref 32.0–36.0)
MCV: 105 fL — ABNORMAL HIGH (ref 80–100)
PLATELETS: 203 10*3/uL (ref 150–440)
RBC: 3.94 10*6/uL — AB (ref 4.40–5.90)
RDW: 13.6 % (ref 11.5–14.5)
WBC: 5 10*3/uL (ref 3.8–10.6)

## 2013-03-23 LAB — ACETAMINOPHEN LEVEL: Acetaminophen: <2 ug/mL

## 2013-03-23 LAB — ETHANOL: Ethanol %: <0.003 % (ref 0.000–0.080)

## 2013-03-23 LAB — TSH: THYROID STIMULATING HORM: 1.97 u[IU]/mL

## 2013-12-30 ENCOUNTER — Emergency Department: Payer: Self-pay | Admitting: Emergency Medicine

## 2013-12-30 LAB — DRUG SCREEN, URINE
AMPHETAMINES, UR SCREEN: NEGATIVE (ref ?–1000)
BENZODIAZEPINE, UR SCRN: POSITIVE (ref ?–200)
Barbiturates, Ur Screen: NEGATIVE (ref ?–200)
CANNABINOID 50 NG, UR ~~LOC~~: NEGATIVE (ref ?–50)
Cocaine Metabolite,Ur ~~LOC~~: NEGATIVE (ref ?–300)
MDMA (Ecstasy)Ur Screen: NEGATIVE (ref ?–500)
Methadone, Ur Screen: NEGATIVE (ref ?–300)
Opiate, Ur Screen: NEGATIVE (ref ?–300)
PHENCYCLIDINE (PCP) UR S: NEGATIVE (ref ?–25)
Tricyclic, Ur Screen: NEGATIVE (ref ?–1000)

## 2013-12-30 LAB — URINALYSIS, COMPLETE
BACTERIA: NONE SEEN
Bilirubin,UR: NEGATIVE
Blood: NEGATIVE
GLUCOSE, UR: NEGATIVE mg/dL (ref 0–75)
Ketone: NEGATIVE
Leukocyte Esterase: NEGATIVE
NITRITE: NEGATIVE
Ph: 7 (ref 4.5–8.0)
Protein: NEGATIVE
RBC,UR: 1 /HPF (ref 0–5)
SQUAMOUS EPITHELIAL: NONE SEEN
Specific Gravity: 1.006 (ref 1.003–1.030)

## 2013-12-30 LAB — COMPREHENSIVE METABOLIC PANEL
ALBUMIN: 3.3 g/dL — AB (ref 3.4–5.0)
ALK PHOS: 87 U/L
Anion Gap: 5 — ABNORMAL LOW (ref 7–16)
BUN: 7 mg/dL (ref 7–18)
Bilirubin,Total: 0.2 mg/dL (ref 0.2–1.0)
CALCIUM: 7.9 mg/dL — AB (ref 8.5–10.1)
CO2: 27 mmol/L (ref 21–32)
CREATININE: 0.99 mg/dL (ref 0.60–1.30)
Chloride: 110 mmol/L — ABNORMAL HIGH (ref 98–107)
EGFR (African American): >60
EGFR (Non-African Amer.): >60
Glucose: 103 mg/dL — ABNORMAL HIGH (ref 65–99)
Osmolality: 281 (ref 275–301)
Potassium: 3.7 mmol/L (ref 3.5–5.1)
SGOT(AST): 25 U/L (ref 15–37)
SGPT (ALT): 8 U/L — ABNORMAL LOW
Sodium: 142 mmol/L (ref 136–145)
TOTAL PROTEIN: 6.5 g/dL (ref 6.4–8.2)

## 2013-12-30 LAB — SALICYLATE LEVEL: Salicylates, Serum: <1.7 mg/dL

## 2013-12-30 LAB — ETHANOL

## 2013-12-30 LAB — CBC
HCT: 40.2 % (ref 40.0–52.0)
HGB: 13.4 g/dL (ref 13.0–18.0)
MCH: 35.9 pg — ABNORMAL HIGH (ref 26.0–34.0)
MCHC: 33.4 g/dL (ref 32.0–36.0)
MCV: 108 fL — ABNORMAL HIGH (ref 80–100)
Platelet: 237 10*3/uL (ref 150–440)
RBC: 3.74 10*6/uL — ABNORMAL LOW (ref 4.40–5.90)
RDW: 12.7 % (ref 11.5–14.5)
WBC: 5.3 10*3/uL (ref 3.8–10.6)

## 2013-12-30 LAB — ACETAMINOPHEN LEVEL: Acetaminophen: <2 ug/mL

## 2013-12-31 LAB — LITHIUM LEVEL: Lithium: 1.03 mmol/L

## 2013-12-31 LAB — VALPROIC ACID LEVEL: Valproic Acid: 83 ug/mL

## 2014-01-31 LAB — CBC
HCT: 40 % (ref 40.0–52.0)
HGB: 13.1 g/dL (ref 13.0–18.0)
MCH: 35.3 pg — ABNORMAL HIGH (ref 26.0–34.0)
MCHC: 32.7 g/dL (ref 32.0–36.0)
MCV: 108 fL — ABNORMAL HIGH (ref 80–100)
Platelet: 290 10*3/uL (ref 150–440)
RBC: 3.71 10*6/uL — ABNORMAL LOW (ref 4.40–5.90)
RDW: 12.7 % (ref 11.5–14.5)
WBC: 6.7 10*3/uL (ref 3.8–10.6)

## 2014-01-31 LAB — COMPREHENSIVE METABOLIC PANEL
ALT: 10 U/L — AB
AST: 21 U/L (ref 15–37)
Albumin: 3.5 g/dL (ref 3.4–5.0)
Alkaline Phosphatase: 95 U/L
Anion Gap: 6 — ABNORMAL LOW (ref 7–16)
BUN: 10 mg/dL (ref 7–18)
Bilirubin,Total: 0.3 mg/dL (ref 0.2–1.0)
CO2: 28 mmol/L (ref 21–32)
Calcium, Total: 8.4 mg/dL — ABNORMAL LOW (ref 8.5–10.1)
Chloride: 108 mmol/L — ABNORMAL HIGH (ref 98–107)
Creatinine: 1.11 mg/dL (ref 0.60–1.30)
EGFR (African American): >60
EGFR (Non-African Amer.): >60
Glucose: 84 mg/dL (ref 65–99)
Osmolality: 281 (ref 275–301)
POTASSIUM: 3.9 mmol/L (ref 3.5–5.1)
Sodium: 142 mmol/L (ref 136–145)
TOTAL PROTEIN: 7.1 g/dL (ref 6.4–8.2)

## 2014-01-31 LAB — URINALYSIS, COMPLETE
BACTERIA: NONE SEEN
BILIRUBIN, UR: NEGATIVE
Blood: NEGATIVE
Glucose,UR: NEGATIVE mg/dL (ref 0–75)
KETONE: NEGATIVE
LEUKOCYTE ESTERASE: NEGATIVE
Nitrite: NEGATIVE
Ph: 7 (ref 4.5–8.0)
Protein: NEGATIVE
RBC,UR: 1 /HPF (ref 0–5)
Specific Gravity: 1.011 (ref 1.003–1.030)
Squamous Epithelial: <1
WBC UR: 3 /HPF (ref 0–5)

## 2014-01-31 LAB — DRUG SCREEN, URINE
Amphetamines, Ur Screen: NEGATIVE (ref ?–1000)
BARBITURATES, UR SCREEN: NEGATIVE (ref ?–200)
BENZODIAZEPINE, UR SCRN: POSITIVE (ref ?–200)
CANNABINOID 50 NG, UR ~~LOC~~: NEGATIVE (ref ?–50)
COCAINE METABOLITE, UR ~~LOC~~: NEGATIVE (ref ?–300)
MDMA (Ecstasy)Ur Screen: NEGATIVE (ref ?–500)
Methadone, Ur Screen: NEGATIVE (ref ?–300)
OPIATE, UR SCREEN: NEGATIVE (ref ?–300)
Phencyclidine (PCP) Ur S: NEGATIVE (ref ?–25)
Tricyclic, Ur Screen: NEGATIVE (ref ?–1000)

## 2014-01-31 LAB — LITHIUM LEVEL: Lithium: 0.73 mmol/L

## 2014-01-31 LAB — ACETAMINOPHEN LEVEL: Acetaminophen: <2 ug/mL

## 2014-01-31 LAB — SALICYLATE LEVEL: Salicylates, Serum: <1.7 mg/dL

## 2014-01-31 LAB — ETHANOL: Ethanol: <3 mg/dL

## 2014-02-01 ENCOUNTER — Inpatient Hospital Stay: Payer: Self-pay | Admitting: Psychiatry

## 2014-02-03 LAB — VALPROIC ACID LEVEL: Valproic Acid: 52 ug/mL

## 2014-02-03 LAB — LITHIUM LEVEL: Lithium: 0.58 mmol/L — ABNORMAL LOW

## 2014-02-03 LAB — TSH: Thyroid Stimulating Horm: 2.38 u[IU]/mL

## 2014-02-10 ENCOUNTER — Emergency Department: Payer: Self-pay | Admitting: Emergency Medicine

## 2014-02-10 LAB — CBC
HCT: 37.3 % — ABNORMAL LOW (ref 40.0–52.0)
HGB: 12.3 g/dL — ABNORMAL LOW (ref 13.0–18.0)
MCH: 35.3 pg — AB (ref 26.0–34.0)
MCHC: 32.9 g/dL (ref 32.0–36.0)
MCV: 107 fL — ABNORMAL HIGH (ref 80–100)
Platelet: 234 10*3/uL (ref 150–440)
RBC: 3.47 10*6/uL — ABNORMAL LOW (ref 4.40–5.90)
RDW: 12.8 % (ref 11.5–14.5)
WBC: 5 10*3/uL (ref 3.8–10.6)

## 2014-02-10 LAB — COMPREHENSIVE METABOLIC PANEL
ALK PHOS: 94 U/L
ANION GAP: 7 (ref 7–16)
Albumin: 3.2 g/dL — ABNORMAL LOW (ref 3.4–5.0)
BUN: 10 mg/dL (ref 7–18)
Bilirubin,Total: 0.2 mg/dL (ref 0.2–1.0)
Calcium, Total: 8.3 mg/dL — ABNORMAL LOW (ref 8.5–10.1)
Chloride: 110 mmol/L — ABNORMAL HIGH (ref 98–107)
Co2: 27 mmol/L (ref 21–32)
Creatinine: 0.94 mg/dL (ref 0.60–1.30)
EGFR (African American): >60
Glucose: 116 mg/dL — ABNORMAL HIGH (ref 65–99)
Osmolality: 287 (ref 275–301)
Potassium: 3.8 mmol/L (ref 3.5–5.1)
SGOT(AST): 21 U/L (ref 15–37)
SGPT (ALT): 12 U/L — ABNORMAL LOW
SODIUM: 144 mmol/L (ref 136–145)
Total Protein: 6.3 g/dL — ABNORMAL LOW (ref 6.4–8.2)

## 2014-02-10 LAB — SALICYLATE LEVEL: Salicylates, Serum: <1.7 mg/dL

## 2014-02-10 LAB — ACETAMINOPHEN LEVEL: Acetaminophen: <2 ug/mL

## 2014-02-10 LAB — ETHANOL: Ethanol: <3 mg/dL

## 2014-02-11 LAB — URINALYSIS, COMPLETE
BLOOD: NEGATIVE
Bacteria: NONE SEEN
Bilirubin,UR: NEGATIVE
GLUCOSE, UR: NEGATIVE mg/dL (ref 0–75)
KETONE: NEGATIVE
LEUKOCYTE ESTERASE: NEGATIVE
NITRITE: NEGATIVE
Ph: 6 (ref 4.5–8.0)
Protein: NEGATIVE
Specific Gravity: 1.013 (ref 1.003–1.030)
Squamous Epithelial: 1

## 2014-02-11 LAB — DRUG SCREEN, URINE

## 2014-02-17 ENCOUNTER — Emergency Department: Payer: Self-pay | Admitting: Emergency Medicine

## 2014-02-17 LAB — COMPREHENSIVE METABOLIC PANEL
ALBUMIN: 3.6 g/dL (ref 3.4–5.0)
ALT: 14 U/L
ANION GAP: 5 — AB (ref 7–16)
Alkaline Phosphatase: 97 U/L
BILIRUBIN TOTAL: 0.4 mg/dL (ref 0.2–1.0)
BUN: 11 mg/dL (ref 7–18)
CHLORIDE: 106 mmol/L (ref 98–107)
CO2: 28 mmol/L (ref 21–32)
CREATININE: 1.01 mg/dL (ref 0.60–1.30)
Calcium, Total: 8.8 mg/dL (ref 8.5–10.1)
EGFR (Non-African Amer.): >60
GLUCOSE: 119 mg/dL — AB (ref 65–99)
OSMOLALITY: 278 (ref 275–301)
POTASSIUM: 3.6 mmol/L (ref 3.5–5.1)
SGOT(AST): 14 U/L — ABNORMAL LOW (ref 15–37)
SODIUM: 139 mmol/L (ref 136–145)
Total Protein: 7 g/dL (ref 6.4–8.2)

## 2014-02-17 LAB — LITHIUM LEVEL: Lithium: 0.68 mmol/L

## 2014-02-17 LAB — DRUG SCREEN, URINE
Amphetamines, Ur Screen: NEGATIVE (ref ?–1000)
BENZODIAZEPINE, UR SCRN: NEGATIVE (ref ?–200)
Barbiturates, Ur Screen: NEGATIVE (ref ?–200)
Cannabinoid 50 Ng, Ur ~~LOC~~: NEGATIVE (ref ?–50)
Cocaine Metabolite,Ur ~~LOC~~: NEGATIVE (ref ?–300)
MDMA (ECSTASY) UR SCREEN: NEGATIVE (ref ?–500)
Methadone, Ur Screen: NEGATIVE (ref ?–300)
OPIATE, UR SCREEN: NEGATIVE (ref ?–300)
Phencyclidine (PCP) Ur S: NEGATIVE (ref ?–25)
Tricyclic, Ur Screen: NEGATIVE (ref ?–1000)

## 2014-02-17 LAB — URINALYSIS, COMPLETE
Bacteria: NONE SEEN
Bilirubin,UR: NEGATIVE
Blood: NEGATIVE
GLUCOSE, UR: NEGATIVE mg/dL (ref 0–75)
Ketone: NEGATIVE
Leukocyte Esterase: NEGATIVE
Nitrite: NEGATIVE
Ph: 8 (ref 4.5–8.0)
Protein: NEGATIVE
RBC,UR: <1 /HPF (ref 0–5)
Specific Gravity: 1.004 (ref 1.003–1.030)
Squamous Epithelial: <1
WBC UR: <1 /HPF (ref 0–5)

## 2014-02-17 LAB — CBC
HCT: 38.7 % — ABNORMAL LOW (ref 40.0–52.0)
HGB: 12.8 g/dL — AB (ref 13.0–18.0)
MCH: 35.2 pg — ABNORMAL HIGH (ref 26.0–34.0)
MCHC: 33 g/dL (ref 32.0–36.0)
MCV: 107 fL — ABNORMAL HIGH (ref 80–100)
Platelet: 229 10*3/uL (ref 150–440)
RBC: 3.63 10*6/uL — AB (ref 4.40–5.90)
RDW: 12.8 % (ref 11.5–14.5)
WBC: 4.8 10*3/uL (ref 3.8–10.6)

## 2014-02-17 LAB — VALPROIC ACID LEVEL: Valproic Acid: 56 ug/mL

## 2014-02-17 LAB — ETHANOL: Ethanol: <3 mg/dL

## 2014-02-17 LAB — ACETAMINOPHEN LEVEL: Acetaminophen: <2 ug/mL

## 2014-02-17 LAB — SALICYLATE LEVEL

## 2014-02-23 ENCOUNTER — Inpatient Hospital Stay: Payer: Self-pay | Admitting: Psychiatry

## 2014-02-23 LAB — CBC
HCT: 38.8 % — ABNORMAL LOW (ref 40.0–52.0)
HGB: 12.5 g/dL — ABNORMAL LOW (ref 13.0–18.0)
MCH: 34.7 pg — AB (ref 26.0–34.0)
MCHC: 32.3 g/dL (ref 32.0–36.0)
MCV: 108 fL — ABNORMAL HIGH (ref 80–100)
Platelet: 232 10*3/uL (ref 150–440)
RBC: 3.6 10*6/uL — ABNORMAL LOW (ref 4.40–5.90)
RDW: 12.8 % (ref 11.5–14.5)
WBC: 5.4 10*3/uL (ref 3.8–10.6)

## 2014-02-23 LAB — COMPREHENSIVE METABOLIC PANEL
Albumin: 3.6 g/dL (ref 3.4–5.0)
Alkaline Phosphatase: 106 U/L
Anion Gap: 5 — ABNORMAL LOW (ref 7–16)
BUN: 9 mg/dL (ref 7–18)
Bilirubin,Total: 0.3 mg/dL (ref 0.2–1.0)
Calcium, Total: 8.5 mg/dL (ref 8.5–10.1)
Chloride: 107 mmol/L (ref 98–107)
Co2: 31 mmol/L (ref 21–32)
Creatinine: 1 mg/dL (ref 0.60–1.30)
EGFR (African American): >60
EGFR (Non-African Amer.): >60
Glucose: 94 mg/dL (ref 65–99)
Osmolality: 283 (ref 275–301)
Potassium: 3.7 mmol/L (ref 3.5–5.1)
SGOT(AST): 21 U/L (ref 15–37)
SGPT (ALT): 14 U/L
Sodium: 143 mmol/L (ref 136–145)
Total Protein: 6.8 g/dL (ref 6.4–8.2)

## 2014-02-23 LAB — ETHANOL: Ethanol: <3 mg/dL

## 2014-02-24 LAB — LITHIUM LEVEL: Lithium: 0.41 mmol/L — ABNORMAL LOW

## 2014-02-24 LAB — VALPROIC ACID LEVEL: Valproic Acid: 56 ug/mL

## 2014-02-25 LAB — URINALYSIS, COMPLETE
BLOOD: NEGATIVE
Bilirubin,UR: NEGATIVE
Glucose,UR: NEGATIVE mg/dL (ref 0–75)
Ketone: NEGATIVE
Leukocyte Esterase: NEGATIVE
NITRITE: NEGATIVE
Ph: 8 (ref 4.5–8.0)
Protein: NEGATIVE
RBC,UR: NONE SEEN /HPF (ref 0–5)
Specific Gravity: 1.005 (ref 1.003–1.030)

## 2014-02-25 LAB — DRUG SCREEN, URINE

## 2014-02-26 LAB — LITHIUM LEVEL: LITHIUM: 0.62 mmol/L

## 2014-02-26 LAB — VALPROIC ACID LEVEL: Valproic Acid: 57 ug/mL

## 2014-03-01 ENCOUNTER — Emergency Department: Payer: Self-pay | Admitting: Emergency Medicine

## 2014-03-01 LAB — DRUG SCREEN, URINE

## 2014-03-01 LAB — URINALYSIS, COMPLETE
BLOOD: NEGATIVE
Bacteria: NONE SEEN
Bilirubin,UR: NEGATIVE
GLUCOSE, UR: NEGATIVE mg/dL (ref 0–75)
Ketone: NEGATIVE
Leukocyte Esterase: NEGATIVE
NITRITE: NEGATIVE
PROTEIN: NEGATIVE
Ph: 8 (ref 4.5–8.0)
RBC,UR: 3 /HPF (ref 0–5)
Specific Gravity: 1.006 (ref 1.003–1.030)
Squamous Epithelial: <1

## 2014-03-01 LAB — CBC
HCT: 40.5 % (ref 40.0–52.0)
HGB: 13.1 g/dL (ref 13.0–18.0)
MCH: 34.8 pg — ABNORMAL HIGH (ref 26.0–34.0)
MCHC: 32.4 g/dL (ref 32.0–36.0)
MCV: 107 fL — ABNORMAL HIGH (ref 80–100)
Platelet: 238 10*3/uL (ref 150–440)
RBC: 3.77 10*6/uL — AB (ref 4.40–5.90)
RDW: 12.8 % (ref 11.5–14.5)
WBC: 5.2 10*3/uL (ref 3.8–10.6)

## 2014-03-01 LAB — COMPREHENSIVE METABOLIC PANEL
ALBUMIN: 3.7 g/dL (ref 3.4–5.0)
ALK PHOS: 107 U/L
ANION GAP: 5 — AB (ref 7–16)
BUN: 10 mg/dL (ref 7–18)
Bilirubin,Total: 0.2 mg/dL (ref 0.2–1.0)
CALCIUM: 8.7 mg/dL (ref 8.5–10.1)
CHLORIDE: 104 mmol/L (ref 98–107)
CREATININE: 0.9 mg/dL (ref 0.60–1.30)
Co2: 29 mmol/L (ref 21–32)
EGFR (African American): >60
GLUCOSE: 87 mg/dL (ref 65–99)
OSMOLALITY: 274 (ref 275–301)
POTASSIUM: 4 mmol/L (ref 3.5–5.1)
SGOT(AST): 24 U/L (ref 15–37)
SGPT (ALT): 11 U/L — ABNORMAL LOW
Sodium: 138 mmol/L (ref 136–145)
TOTAL PROTEIN: 7.2 g/dL (ref 6.4–8.2)

## 2014-03-01 LAB — ACETAMINOPHEN LEVEL: Acetaminophen: <2 ug/mL

## 2014-03-01 LAB — SALICYLATE LEVEL

## 2014-03-01 LAB — ETHANOL

## 2014-06-25 ENCOUNTER — Emergency Department
Admit: 2014-06-25 | Disposition: A | Discharge status: Home / Self Care | Payer: Self-pay | Admitting: Emergency Medicine

## 2014-06-25 LAB — DRUG SCREEN, URINE
Amphetamines, Ur Screen: NEGATIVE
BENZODIAZEPINE, UR SCRN: NEGATIVE
Barbiturates, Ur Screen: NEGATIVE
CANNABINOID 50 NG, UR ~~LOC~~: NEGATIVE
Cocaine Metabolite,Ur ~~LOC~~: NEGATIVE
MDMA (ECSTASY) UR SCREEN: NEGATIVE
Methadone, Ur Screen: NEGATIVE
Opiate, Ur Screen: NEGATIVE
Phencyclidine (PCP) Ur S: NEGATIVE
TRICYCLIC, UR SCREEN: NEGATIVE

## 2014-06-25 LAB — URINALYSIS, COMPLETE
BACTERIA: NONE SEEN
Bilirubin,UR: NEGATIVE
Blood: NEGATIVE
GLUCOSE, UR: NEGATIVE mg/dL (ref 0–75)
Ketone: NEGATIVE
LEUKOCYTE ESTERASE: NEGATIVE
NITRITE: NEGATIVE
Ph: 7 (ref 4.5–8.0)
Protein: NEGATIVE
SPECIFIC GRAVITY: 1.012 (ref 1.003–1.030)
Squamous Epithelial: <1
WBC UR: 1 /HPF (ref 0–5)

## 2014-06-25 LAB — CBC
HCT: 37.7 % — AB (ref 40.0–52.0)
HGB: 12.4 g/dL — AB (ref 13.0–18.0)
MCH: 33.7 pg (ref 26.0–34.0)
MCHC: 33 g/dL (ref 32.0–36.0)
MCV: 102 fL — AB (ref 80–100)
Platelet: 226 10*3/uL (ref 150–440)
RBC: 3.68 10*6/uL — ABNORMAL LOW (ref 4.40–5.90)
RDW: 13.4 % (ref 11.5–14.5)
WBC: 4.6 10*3/uL (ref 3.8–10.6)

## 2014-06-26 LAB — COMPREHENSIVE METABOLIC PANEL
ALBUMIN: 3.8 g/dL
ALK PHOS: 73 U/L
AST: 13 U/L — AB
Anion Gap: 7 (ref 7–16)
BUN: 13 mg/dL
Bilirubin,Total: <0.1 mg/dL — ABNORMAL LOW
CALCIUM: 8.8 mg/dL — AB
CO2: 24 mmol/L
Chloride: 108 mmol/L
Creatinine: 0.95 mg/dL
EGFR (African American): >60
EGFR (Non-African Amer.): >60
Glucose: 109 mg/dL — ABNORMAL HIGH
POTASSIUM: 3.9 mmol/L
SGPT (ALT): 5 U/L — ABNORMAL LOW
Sodium: 139 mmol/L
Total Protein: 6.5 g/dL

## 2014-06-26 LAB — ETHANOL

## 2014-06-26 LAB — ACETAMINOPHEN LEVEL

## 2014-06-26 LAB — SALICYLATE LEVEL: Salicylates, Serum: <4 mg/dL
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
## 2014-07-10 NOTE — Consult Note (Signed)
Brief Consult Note: Diagnosis: Schizoaffective disorder bipolar type.   Patient was seen by consultant.   Consult note dictated.   Recommend further assessment or treatment.   Orders entered.   Discussed with Attending MD.   Comments: [name] has a long h/o mental illness [facility] He is in care of Dr. Cherylann RatelLateef and [facility] team. He has been compliant with medications as evidenced by therapeutic VPA level.  He has been in our ER since July 10th. In spite of multiple medications given, including 2 mood stabilizers and two antipsychotics, the patient requires frequent prns. Last night he did not sleep at all and has been agiteted since.   PLAN: 1. [name] is on wait list for [facility]. I feel that longer hospitalization is paramount for his recovery. Bed will not be available until middle of next week at the earliest.  2. Mood: He is to continue Depakote and Lithium for mood stabilization. Level will be checked on Monday. We added Ativan for further mood stabilization and Restoril for sleep.    3. Psychosis: He is on injectable Invega sustenna and Zyprexa 20 mg at night.   4. Agitation: Geodon im and Thorazine im injections are available for agitation. We added, temporarily, standing doe of Thorazine 50 mg qid ijn an attempt to prevent agitation.   5. We will continue all other medications as prescribed by PCP and Dr. Leavy CellaBlocker.  6. Please page if problems. Dr. Maryruth BunKapur will return on Monday.  Electronic Signatures: Kristine LineaPucilowska, Vallorie Niccoli (MD)  (Signed 19-Jul-13 17:39)  Authored: Brief Consult Note   Last Updated: 19-Jul-13 17:39 by Kristine LineaPucilowska, Valbona Slabach (MD)

## 2014-07-10 NOTE — Consult Note (Signed)
Details:    - Psychiatry: Patient with scizoaffective disorder who is waiting in the ER for a bed [facility] become avail[facility]. He continues to be intermittantly agitated and frequently confused, but he is usually redirectable. Tolerating meds ok. Supportive maintainence done. Continue current medication while awaiting theraputic transfer to a facility designed for longer term treatment   Electronic Signatures: Audery Amellapacs, Shakeeta Godette T (MD)  (Signed 21-Jul-13 00:00)  Authored: Details   Last Updated: 21-Jul-13 00:00 by Audery Amellapacs, Divit Stipp T (MD)
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
## 2014-07-11 ENCOUNTER — Emergency Department
Admit: 2014-07-11 | Disposition: A | Discharge status: Home / Self Care | Payer: Self-pay | Admitting: Emergency Medicine

## 2014-07-11 LAB — COMPREHENSIVE METABOLIC PANEL
ALK PHOS: 89 U/L
ANION GAP: 3 — AB (ref 7–16)
AST: 14 U/L — AB
Albumin: 4 g/dL
BUN: 13 mg/dL
Bilirubin,Total: 0.3 mg/dL
CALCIUM: 8.8 mg/dL — AB
Chloride: 109 mmol/L
Co2: 27 mmol/L
Creatinine: 0.93 mg/dL
EGFR (Non-African Amer.): >60
Glucose: 110 mg/dL — ABNORMAL HIGH
Potassium: 3.7 mmol/L
SGPT (ALT): 6 U/L — ABNORMAL LOW
Sodium: 139 mmol/L
Total Protein: 6.6 g/dL

## 2014-07-11 LAB — URINALYSIS, COMPLETE
BLOOD: NEGATIVE
Bacteria: NONE SEEN
Bilirubin,UR: NEGATIVE
Glucose,UR: NEGATIVE mg/dL (ref 0–75)
KETONE: NEGATIVE
Leukocyte Esterase: NEGATIVE
Nitrite: NEGATIVE
PH: 7 (ref 4.5–8.0)
Protein: NEGATIVE
Specific Gravity: 1.003 (ref 1.003–1.030)

## 2014-07-11 LAB — DRUG SCREEN, URINE
Amphetamines, Ur Screen: NEGATIVE
BENZODIAZEPINE, UR SCRN: NEGATIVE
Barbiturates, Ur Screen: NEGATIVE
Cannabinoid 50 Ng, Ur ~~LOC~~: NEGATIVE
Cocaine Metabolite,Ur ~~LOC~~: NEGATIVE
MDMA (Ecstasy)Ur Screen: NEGATIVE
METHADONE, UR SCREEN: NEGATIVE
OPIATE, UR SCREEN: NEGATIVE
Phencyclidine (PCP) Ur S: NEGATIVE
TRICYCLIC, UR SCREEN: NEGATIVE

## 2014-07-11 LAB — CBC
HCT: 39 % — ABNORMAL LOW (ref 40.0–52.0)
HGB: 12.8 g/dL — AB (ref 13.0–18.0)
MCH: 33.7 pg (ref 26.0–34.0)
MCHC: 32.8 g/dL (ref 32.0–36.0)
MCV: 103 fL — ABNORMAL HIGH (ref 80–100)
Platelet: 215 10*3/uL (ref 150–440)
RBC: 3.79 10*6/uL — AB (ref 4.40–5.90)
RDW: 13.1 % (ref 11.5–14.5)
WBC: 4.3 10*3/uL (ref 3.8–10.6)

## 2014-07-11 LAB — ETHANOL: Ethanol: <5 mg/dL

## 2014-07-11 LAB — ACETAMINOPHEN LEVEL: Acetaminophen: <10 ug/mL

## 2014-07-11 LAB — LITHIUM LEVEL: LITHIUM: 0.2 mmol/L — AB (ref 0.60–1.20)

## 2014-07-11 LAB — SALICYLATE LEVEL: Salicylates, Serum: <4 mg/dL
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
## 2014-07-13 NOTE — Consult Note (Signed)
Brief Consult Note: Diagnosis: Paranoid schizophrenia.   Comments: The patient was discharged before seen by a psychiatrist.  Electronic Signatures: Kristine LineaPucilowska, Jolanta (MD)  (Signed 07-Mar-14 19:14)  Authored: Brief Consult Note   Last Updated: 07-Mar-14 19:14 by Kristine LineaPucilowska, Jolanta (MD)

## 2014-07-13 NOTE — Consult Note (Signed)
PATIENT NAME:  [name], [name] MR#:  [id number] DATE OF BIRTH:  07/20/1960  PSYCHIATRY CONSULTATION  DATE OF CONSULTATION:  09/28/2012  CHIEF COMPLAINT: "I came here to get the ball rolling. I trust Mr. Rudell Cobb."  HISTORY OF PRESENT ILLNESS: [name] reports that he has decided that he wants his own place, because he has been with the help of the ACTT team looking for one. He has the lease that he and a landlord had signed, and he wants Mr. Theodosia Paling  to help him work this out as things seem at loose ends.  He reports that he is his own guardian and payee and that is what he wants is his own apartment.   On interview he is rather animated, but is able to adhere to the subjects at hand and talk about them in a cogent, linear, organized fashion. From time to time his speech is pressured, but he is not aggressive. He does not endorse hallucinations or delusions. There is some slight disorganization to his presentation, but he makes himself understandable and has the papers that he needs to discuss the subject at hand which is "getting my own place." He does not appear to be anxious. He does appear to be a somewhat impulsive and a little disinhibited. His cognitive state is attentive and alert. He is able to concentrate on the topic at hand. There is no disorientation, psychosis or hyperarousal. He seems to be understand the topic at hand and what he needs to do and needs help with.   PAST PSYCHIATRIC TREATMENT: [name] has a long history of psychiatric treatment.  He has had numerous previous hospitalizations and was hospitalized at one time on the [facility] for 5 years. He denies any history of suicide attempts or any history of violence.  Current outpatient treatment is with Dr. Cherylann Ratel (ACTT Team psychiatrist).   SUBSTANCE ABUSE TREATMENT: He denies.   CURRENT PSYCHOTROPIC MEDICATIONS: Lithium 600 mg p.o. b.i.d. It should be noted that [name] lithium level is 0.84, lorazepam 1 mg  b.i.d. for agitation, Depakote ER 500 p.o. q.a.m. and 1000 mg p.o. at bedtime. It should be noted that his Depakote level is 74. Benztropine  2 mg b.i.d., Hinda Glatter Sustenna 156 mg per mL intramuscular suspension, extended-release, 1 mL  IM q. 4 weeks, fluphenazine 15 mg once a day at bedtime.   OTHER MEDICATIONS: Terazosin 1 mg p.o. at bedtime, emtricitibine-tenofovir  200 mg- 300 mg oral tablet once a day at bedtime, efavirenz 600 mg at bedtime, aspirin 81 mg daily, Tamsulosin 0.4 mg daily, carvedilol 3.125 mg p.o. b.i.d., finasteride 5 mg daily, levothyroxine 25 mcg, 1 p.o. daily, cholecalciferol 2000 international units oral tablet daily, docusate sodium 100 mg p.o. at bedtime.   ALLERGIES: No known drug allergies.   [name] is currently taking his medicines and has been for the last year and a half that he has been housed at his group home, [facility]. He reports that his mother also was there for a year and a half.   SUBSTANCE AND ALCOHOL ABUSE: He denied.  [name] reports that his mother resided in the same group home he is in for a year and a half, and had mental illness.   SOCIAL HISTORY: [name] is unmarried and lives in a group home.   HISTORY OF TRAUMA OR ABUSE: None.   LEGAL HISTORY: He denies any arrests or previous incarcerations.   MENTAL STATUS EXAM: His speech is fluent, slightly  pressured. His mood is anxious. He appears euthymic, but at times irritated. His thought processes are generally linear, logical and goal-directed. His thought content is devoid of suicidal or homicidal ideation, intent or plan. There are no auditory, visual hallucinations or delusions noted or reported. He is oriented x 4. His attention is alert, awake and oriented. His concentration is fair. His memory is intact. His fund of knowledge is fair. His language is fair. His judgment is fair. His insight is poor.   REVIEW OF SYSTEMS: Noncontributory.   DIAGNOSES: AXIS I:  PRINCIPAL AND  PRIMARY:  Schizoaffective disorder.   AXIS II: Deferred.   AXIS III: HIV.   AXIS IV: Moderate to severe.   AXIS V: Probably about 60.     ____________________________ Izola PriceFrances C. Jaclynn MajorGreason, MD fcg:dm D: 09/28/2012 13:35:28 ET T: 09/28/2012 14:40:03 ET JOB#: [id number]  cc: Izola PriceFrances C. Jaclynn MajorGreason, MD, <Dictator> Maryan PulsFRANCES C Sharmeka Palmisano MD ELECTRONICALLY SIGNED 09/28/2012 16:37

## 2014-07-14 ENCOUNTER — Emergency Department
Admit: 2014-07-14 | Disposition: A | Discharge status: Home / Self Care | Payer: Self-pay | Admitting: Emergency Medicine

## 2014-07-14 LAB — URINALYSIS, COMPLETE
BLOOD: NEGATIVE
Bacteria: NONE SEEN
Bilirubin,UR: NEGATIVE
Glucose,UR: NEGATIVE mg/dL (ref 0–75)
Ketone: NEGATIVE
Leukocyte Esterase: NEGATIVE
NITRITE: NEGATIVE
PROTEIN: NEGATIVE
Ph: 7 (ref 4.5–8.0)
Specific Gravity: 1.003 (ref 1.003–1.030)

## 2014-07-14 LAB — SALICYLATE LEVEL: Salicylates, Serum: <4 mg/dL

## 2014-07-14 LAB — COMPREHENSIVE METABOLIC PANEL WITH GFR
Albumin: 4.1 g/dL
Alkaline Phosphatase: 89 U/L
Anion Gap: 6 — ABNORMAL LOW
BUN: 9 mg/dL
Bilirubin,Total: 0.5 mg/dL
Calcium, Total: 9 mg/dL
Chloride: 105 mmol/L
Co2: 26 mmol/L
Creatinine: 1.01 mg/dL
EGFR (African American): >60
EGFR (Non-African Amer.): >60
Glucose: 80 mg/dL
Potassium: 3.7 mmol/L
SGOT(AST): 17 U/L
SGPT (ALT): <5 U/L
Sodium: 137 mmol/L
Total Protein: 7.1 g/dL

## 2014-07-14 LAB — DRUG SCREEN, URINE
Amphetamines, Ur Screen: NEGATIVE
BARBITURATES, UR SCREEN: NEGATIVE
BENZODIAZEPINE, UR SCRN: NEGATIVE
CANNABINOID 50 NG, UR ~~LOC~~: NEGATIVE
Cocaine Metabolite,Ur ~~LOC~~: NEGATIVE
MDMA (ECSTASY) UR SCREEN: NEGATIVE
Methadone, Ur Screen: NEGATIVE
Opiate, Ur Screen: NEGATIVE
PHENCYCLIDINE (PCP) UR S: NEGATIVE
Tricyclic, Ur Screen: NEGATIVE

## 2014-07-14 LAB — CBC
HCT: 39.2 % — ABNORMAL LOW (ref 40.0–52.0)
HGB: 12.9 g/dL — ABNORMAL LOW (ref 13.0–18.0)
MCH: 34 pg (ref 26.0–34.0)
MCHC: 33 g/dL (ref 32.0–36.0)
MCV: 103 fL — ABNORMAL HIGH (ref 80–100)
PLATELETS: 228 10*3/uL (ref 150–440)
RBC: 3.81 10*6/uL — ABNORMAL LOW (ref 4.40–5.90)
RDW: 13.2 % (ref 11.5–14.5)
WBC: 5.7 10*3/uL (ref 3.8–10.6)

## 2014-07-14 LAB — ETHANOL: Ethanol: <5 mg/dL

## 2014-07-14 LAB — VALPROIC ACID LEVEL: Valproic Acid: 65 ug/mL

## 2014-07-14 LAB — LITHIUM LEVEL: Lithium: 0.4 mmol/L — ABNORMAL LOW

## 2014-07-14 LAB — ACETAMINOPHEN LEVEL

## 2014-07-14 NOTE — Consult Note (Signed)
PATIENT NAME:  [name], STOIBER MR#:  [id number] DATE OF BIRTH:  February 17, 1961  DATE OF CONSULTATION:  03/24/2013  CONSULTING PHYSICIAN:  Audery Amel, MD  IDENTIFYING INFORMATION AND REASON FOR CONSULT: A 54 year old gentleman with a history of schizoaffective disorder, who is brought into the hospital because he became agitated at his group home.   CHIEF COMPLAINT: "I guess I should take my medicine."   HISTORY OF PRESENT ILLNESS: Information obtained from the patient and the chart. The patient came into the hospital voluntarily having requested to be brought over here. He felt like his mood was getting more angry. He was feeling angry towards people at his group home and says that he raised his fist, although he did not actually hit anyone and did not think he really planned to hit anyone. He says he was feeling angry at them although he has a hard time articulating why.  From the collateral identifying information we have gotten, it sounds like he has been upset because he owes some money. He realizes that he will not get any cash until his check comes next week and when that happens he will still only get his usual 60 some dollars. This has been frustrating to him. He has had a relatively new group home for the last several months and is still adjusting to it. He has not been abusing substances. The patient claims he has not taken his medicine for about 2 days but he realizes now that was a mistake. He said that he has been feeling tired and run down. He denied that he is wanting to hurt himself or anyone else or having any hallucinations.   PAST PSYCHIATRIC HISTORY: A gentleman with a long schizoaffective disorder with multiple extended manic episodes. He has had multiple hospitalizations in his life including extended ones in the [facility] hospital. He tends to get manic and agitated at times and be very difficult to redirect. Fortunately, since being in the current group home, things seem to  have calmed down a little bit. He has not been back to our Emergency Room since summer. He is followed up by the [facility] ACT team. There is no history of him really being seriously dangerous, just agitated and out of control in the past.   SUBSTANCE ABUSE HISTORY: There is no known history of alcohol or drug dependence in this patient.   PAST MEDICAL HISTORY: The patient is HIV positive. He is followed by an HIV clinic. Additionally, he has high blood pressure, hypothyroidism, chronic constipation, probably related to his medication, dyslipidemia.   SOCIAL HISTORY: The patient is managed by the ACT team and is living in a group home. Family are not really actively involved in his care at all. Minimal social contact outside the group home.   FAMILY HISTORY: No known family history identified.   CURRENT MEDICATIONS: Simvastatin 20 mg once a day, terazosin 1 mg once a day, emtricitabine-tenofovir 1 tablet per day, lithium 600 mg twice a day, lorazepam 1 mg twice a day, Depakote extended-release 500 mg in the morning and 1000 at night, Cogentin 2 mg twice a day, Invega substance 156 mg IM once every month, efavirenz 600 mg once a day, aspirin 81 mg a day, tamsulosin 0.4 mg once a day, carvedilol 3.125 mg twice a day, finasteride 5 mg once a day, levothyroxine 25 mcg a day, vitamin B12 2000 units a day, fluphenazine 5 mg 3 tablets at night, docusate 100 mg a day and MiraLax 17 grams every  other day.   ALLERGIES: No known drug allergies.   REVIEW OF SYSTEMS: He denies auditory hallucinations. Denies suicidal or homicidal ideation. Says he has been having some trouble sleeping recently, but he is starting to feel tired out. No new specific physical symptoms.   MENTAL STATUS EXAM:  Disheveled gentleman. He is interactive but he certainly controls the pace of the interaction. Eye contact intermittent. Psychomotor activity a little bit fidgety but not nearly as bad as I have seen it before. He is actually able to  sit still and have a conversation. Speech is rapid and a little bit pressured at times, but he also was able to stop and listen. Thoughts are lose with some flight of ideas but he actually a relatively good job at keeping it together and coming back to the topic. Denies suicidal or homicidal ideation. Denies hallucinations. Judgment and insight adequate to the situation. Intelligence average. Alert and oriented x 4.   VITAL SIGNS: Temperature 97.4, pulse 80, respirations 19, blood pressure 138/85.   LABORATORY RESULTS: Chemistry panel: No significant abnormalities. Alcohol negative. CBC: Low red blood cell count 3.94, elevated MCV 105. TSH normal at 1.97. Drug screen negative.   ASSESSMENT: A 54 year old man with schizoaffective disorder. He has come in here because he was getting agitated at home. He has been put back on his medicine since he has been here and he has been cooperative with treatment. There is no sign of acute dangerousness. Given the kind of mania that [name] has had in the past, he is actually relatively asymptomatic right now, probably about as good as he gets. He got frustrated but now he has calmed down and has better insight into it. There is no indication for inpatient treatment.   TREATMENT PLAN: Recommend he be discharged from the Emergency Room. We will send them back to his group home. He will follow up with the [facility] ACT team. No change to medication.   DIAGNOSIS, PRINCIPAL AND PRIMARY:  AXIS I: Schizoaffective disorder, most recently manic currently stable.   SECONDARY DIAGNOSES: AXIS I: No further.  AXIS II: No diagnosis.  AXIS III: HIV positive hypertension, dyslipidemia, hypothyroidism.  AXIS IV: Moderate chronic stress from his illness.  AXIS V: Functioning at time of evaluation 50.   ____________________________ Audery AmelJohn T. Anthonio Mizzell, MD jtc:dp D: 03/24/2013 13:29:27 ET T: 03/24/2013 14:37:05 ET JOB#: [id number]  cc: Audery AmelJohn T. Browning Southwood, MD, <Dictator> Audery AmelJOHN T  Monita Swier MD ELECTRONICALLY SIGNED 03/29/2013 23:31

## 2014-07-14 NOTE — H&P (Signed)
PATIENT NAME:  [name], [name] MR#:  [id number] DATE OF BIRTH:  Jan 22, 1961  DATE OF ADMISSION:  02/23/2014  CONSULTING PHYSICIAN: Audery AmelJohn T. Elle Vezina, MD.   IDENTIFYING INFORMATION AND REASON FOR CONSULTATION: A 54 year old man with a history of schizoaffective disorder who presents to the hospital in the company of police.   CHIEF COMPLAINT: "I can't go back to that group home."   HISTORY OF PRESENT ILLNESS: The patient gives a rambling history, but it sounds like today, he once again and walked to the local convenience store and used the phone to call 911. He is expressing his usual anger about the fact that they take all of his check. This is the beginning of the month and they probably just reconciled his check and left him with the less than the usual $66.00. He is angry about it and says that if he goes back to the group home, he is going to kill those people. Also talks about killing himself. He says that his mood is bad. He tells me that he has not been taking his medicine very regularly recently. Denies to me that he using any drugs.   PAST PSYCHIATRIC HISTORY: Long history of schizoaffective disorder, multiple hospitalizations including [facility] hospitalizations at [facility]. When manic, he can be quite disruptive. He has a history of medicine noncompliance at times in the past.   SOCIAL HISTORY: The patient lives at a group home. He does not like it and is constantly arguing about his money, even though he has been through this whole situation for years.   FAMILY HISTORY: Unknown.   SUBSTANCE ABUSE HISTORY: He says that he has not been using any drugs or alcohol recently. He uses alcohol intermittently, but says he has not been doing it recently.   PAST MEDICAL HISTORY: He is HIV positive. Also has prostate hypertrophy, hypertension, chronic constipation, hypothyroidism and dyslipidemia.   CURRENT MEDICATIONS: Aspirin 81 mg per day, Cogentin 2 mg twice a day, Coreg 3.125 mg twice a  day, vitamin D3 - 1000 to 2000 mg a day, Clonazepam 1 mg at night, Depakote 1500 mg in the evening, docusate 100 mg twice, Atripla 1 tablet once a day, Prolixin 15 mg at night, Synthroid 25 mcg in the morning, lithium 600 mg twice a day, Tamsulosin 0.4 mg once a day, Zocor 20 mg at night, Hytrin 1 mg at night.   ALLERGIES: No known drug allergies.   REVIEW OF SYSTEMS: Angry, depressed, claims suicidal and homicidal ideation. Says he is having hallucinations, will not specify what they are. Does not have any specific physical complaints.   MENTAL STATUS EXAMINATION: Disheveled gentleman who looks more disorganized and disheveled than the last couple of times I have seen him. Eye contact poor. Psychomotor activity very slow and sluggish. Speech is decreased in total amount and somewhat slurred. Affect dysphoric, at times irritable. Mood stated as being depressed. Thoughts are disorganized and hard to follow. Endorses auditory hallucinations. Endorses both suicidal and homicidal ideation without specific plan. He can repeat 3/3 objects immediately and remembers 3/3 at 3 minutes. He is alert and oriented x 4. Judgment and insight poor. Baseline intelligence normal.   PHYSICAL EXAMINATION:  SKIN: No acute skin lesions identified.   HEENT: Pupils equal and reactive. Face symmetric.   NEUROLOGIC: Moves all extremities normally. Normal gait. Strength and reflexes symmetric throughout. Cranial nerves symmetric and normal.   MUSCULOSKELETAL: Neck and back nontender to palpation.   LUNGS: Clear without wheezes.   HEART: Regular rate and  rhythm.   ABDOMEN: Soft nontender, normal bowel sounds.   VITAL SIGNS: Show current blood pressure 142/89, respirations 18, pulse 79, temperature 97.8.   LABORATORY RESULTS: Drug screen is all negative. Urinalysis negative. CBC shows a hematocrit low at 38.8, hemoglobin low at 12.5, elevated MCV. Chemistry panel is really all normal. Alcohol level negative.    ASSESSMENT: A 54 year old man with schizoaffective disorder who frequently comes to the emergency room with similar complaints. This time he looks more depressed and disorganized and psychotic than I have seen him recently. He is not able to contract reasonably for safety or described reasonable behavior at home or show any evidence that have any patience. I do not think that he is likely to do well back at home and needs some stabilization.   TREATMENT PLAN: The patient will be admitted to the psychiatric service. In addition to the medicines quoted above, he gets [place] shots every 4 weeks. I am not sure when his last one was. He does need to be back on his normal medicines however. We do not have a blood level of lithium and Depakote yet, so I will check one tomorrow. Close elopement and fall precautions in place.   DIAGNOSIS, PRINCIPAL AND PRIMARY:   AXIS I: Schizoaffective disorder, bipolar-type, depressed.   SECONDARY DIAGNOSES: AXIS I: No further diagnosis.   AXIS II: Deferred.   AXIS III:  1. Human immunodeficiency virus positive.  2. Gastric reflux.  3. Hypertension.  4. Dyslipidemia.  5. Prostate hypertrophy.  6. Hypothyroidism.    ____________________________ Audery Amel, MD jtc:TT D: 02/23/2014 16:03:52 ET T: 02/23/2014 16:22:23 ET JOB#: [id number]  cc: Audery Amel, MD, <Dictator> Audery Amel MD ELECTRONICALLY SIGNED 03/04/2014 11:15

## 2014-07-14 NOTE — Consult Note (Signed)
PATIENT NAME:  Lavona MoundRKER, Kace MR#:  [id number] DATE OF BIRTH:  1960/05/21  DATE OF CONSULTATION:  02/11/2014  CONSULTING PHYSICIAN:  Malillany Kazlauskas K. Tc Kapusta, MD  SUBJECTIVE: The patient was [facility] in [facility], [facility]. The patient is a 54 year old, African-American male with a long history of mental illness, not working for many years and has been on disability for schizoaffective disorder. The patient current lives at a group home called [facility], which is again an adult care facility. The patient reports that he has been living at his current situation since July of 2015. Prior to that, he had lives at [facility]. He had lived there for 1 year and 9 months and did not like it either. The patient was last discharged from [facility] on 02/05/2014 after being stabilized on his medications. The patient reports that he has been on various antipsychotic medications and most of the time, he was on IM medications.   Currently, he is on [place] Sustenna. The patient reports that he does not like the current living situation and he reports that he wants the doctors and the staff to write on his behalf in such a way that he can get his money in his name so that he can live in a place that he likes and do what he wants to do. The patient reports that he started having suicidal/homicidal towards his ex-girlfriend, whom he has not seen in several months or years. the patient walked out of the group home, went to the police station and asked them to give him a ride to [facility] and then called back the group home and told them that he was here. In addition, the patient reports that he had been hearing voices and seeing things when he came here, but not at this moment.   The patient reports that he has not been complaint with his medications. When the nurse turns her back, he puts the medications in the toilet and he flushes them down.  PAST PSYCHIATRIC HISTORY:  History of inpatient hospital psychiatry about 20 times or more at [facility] for a similar episode of getting agitated, depressed, upset, hearing voices, seeing things and having ideas to hurt people around and in fact, he was in jail many times for threatening clients at the group home and staff, been to prison on a couple of occasions for similar problems.   SOCIAL HISTORY: Alcohol and drugs: Admits that he drinks 2 or 3 beers in a period of a week. Has not driven in more than 20 years. Denies any other street or prescription drug abuse.   MENTAL STATUS: The patient is lying in bed, alert and oriented to place, person and time. Fully aware of the situation that brought him here to [facility]. Upset, irritable and agitated. Admits that he was suicidal and homicidal towards his ex-girlfriend who cheated him of his money, but he has not seen her in quite a while. He reported that he has been seeing things and hearing voices, but currently at this moment he is not. He does get upset and irritable towards people around him and feels paranoid about people around him. He feels like hurting them at times. He has been to prison and jail many times for threatening other clients at the group home and the staff. Insight and judgment guarded. Impulse control is poor.   IMPRESSION: Schizoaffective disorder with psychosis and depression.   RECOMMENDATION: Start the patient back  on his current medications. The patient needs a higher level of care. He will be requested for admission at [facility] for the same.     ____________________________ Jannet Mantis. Guss Bunde, MD skc:TT D: 02/11/2014 11:38:01 ET T: 02/11/2014 14:38:50 ET JOB#: [id number]  cc: Monika Salk K. Guss Bunde, MD, <Dictator> Beau Fanny MD ELECTRONICALLY SIGNED 02/24/2014 15:42

## 2014-07-14 NOTE — Consult Note (Signed)
PATIENT NAME:  [name], [name] MR#:  [id number] DATE OF BIRTH:  Jan 24, 1961  DATE OF CONSULTATION:  03/01/2014  REFERRING PHYSICIAN:   CONSULTING PHYSICIAN:  [name], MD  IDENTIFYING INFORMATION AND REASON FOR CONSULTATION: This is a 54 year old man with a history of schizoaffective disorder who brought himself back to the Emergency Room. Consult for evaluation of psychiatric need.   HISTORY OF PRESENT ILLNESS: Information obtained from the patient and the chart. The patient's chief complaint, "I just don't want to be at that group home". The patient tells me very clearly that he walked away from the group home yesterday because he did not like it there and did not want to live there anymore. He walked down the street to the local convenience store and used the telephone to call 911. This is the same behavior he has done multiple times in the past. The police have told him previously that if he keeps doing it they are going to take him to jail for abusing the 911 system and he fully expected that was what would happen this time, but instead they agreed to bring him to the hospital. When he came in here, the patient stated that he was having suicidal and homicidal thoughts. On interview today, he denies any suicidal or homicidal thoughts or plans or wishes. Says that he just said that so that he could justify being in the hospital. He says his mood is irritated at the group home. He indicated that he did take his medicines since being discharged to the group home. He has chronic auditory hallucinations which rarely change even with medicine and have not been any different recently. He is not expressing any actively delusional thoughts and his behavior is not driven by any active delusions. He simply dislikes the group home and wants to move back to [place]. He has an idea that if he gets back to [place] somehow that will solve his problems, although it is not clear that he has any better resources  there. I know that he has been to see Dr. [name] and has been following up with his HIV treatment.   PAST PSYCHIATRIC HISTORY: Long history of schizoaffective disorder or bipolar disorder. Multiple hospitalizations. Recently he has been back to our hospital or the Emergency Room several times. He was discharged from the hospital just 2 days ago. He does have a history of being somewhat aggressive, although he has not been acting violently here recently. He was noncompliant with treatment and somewhat disruptive when he was on the ward, but did not actually hurt anyone. Does not appear to have a history of suicidal attempts, certainly nothing recently.   SOCIAL HISTORY: He lives at a group home. He does not like it and they do not seem to be very pleased with him anymore. He keeps leaving it and trying to get himself transferred by coming into the hospital. He chronically argues with people about his money, which has been an issue forever.   FAMILY HISTORY: He denies any family history.   SUBSTANCE ABUSE HISTORY: No recent use of alcohol or drugs. Has a history of past alcohol abuse occasionally, but nothing recently.   PAST MEDICAL HISTORY: The patient is HIV positive. He follows up with Dr. [name] here locally. He is well aware of the illness, its consequences and its treatment. He also has a history of prostate hypertrophy, hypertension, hypothyroidism and dyslipidemia.   CURRENT MEDICATIONS: Reconciliation shows aspirin 81 mg a day, tamsulosin 0.4 mg once  a day, terazosin 1 mg once a day, Depakote ER 1500 mg at night, simvastatin 20 mg at night, Cogentin 1 mg twice a day, Prolixin 15 mg at night, Invega Sustenna 156 mg intramuscularly every 4 weeks, lithium 600 mg twice a day, Atripla 1 tablet once a day, Carvedilol 3.125 mg twice a day, docusate 100 mg once a day, levothyroxine 25 mcg once a day, vitamin D 2,000 international units once a day.   ALLERGIES: No known drug allergies.    REVIEW OF SYSTEMS: Irritated at his group home. Chronic auditory hallucinations which he does not pay attention to. Denies suicidal or homicidal ideation. Denies any new physical symptoms. No GI, cardiac, pulmonary or pain complaints. Full otherwise review of systems negative.   MENTAL STATUS EXAMINATION: Disheveled gentleman who looks his stated age, who is only partially cooperative with the interview. Tends to dominate it. Eye contact decreased. Psychomotor activity is largely sluggish physically. Speech however can be pressured when he is trying to make a point and loud at times, although he is not being disruptive here in general. His affect is irritated but reactive. Mood is stated as being all right. Thoughts are focused on wanting to get out of his group home. No evidence of obvious delusions. No statement of paranoia. Denies suicidal or homicidal ideation. Has chronic auditory hallucinations, no visual hallucinations. He is alert and oriented x4. He can repeat 3/3 objects immediately and remembers all of them at three minutes. Normal intelligence. Judgment and insight somewhat impaired by his chronic illness.   LABORATORY RESULTS: Drug screen was positive for benzodiazepines. Urinalysis unremarkable. Chemistry panel: Low ALT, nothing else remarkable. CBC normal. White count otherwise unremarkable.  VITAL SIGNS: His blood pressure is 166/111, respirations 18, pulse 89, temperature 98.6.   ASSESSMENT: A 54 year old man with schizoaffective disorder who is clearly manipulating the system once again in an attempt to get out of his group home. He denies suicidal or homicidal ideation. Although he has chronic psychotic symptoms they are not driving his behavior and are not different than usual. The patient currently does not require or meet criteria for inpatient commitment or for commitment criteria or for hospitalization.   TREATMENT PLAN: The patient has been advised of all of this and strongly  encouraged to stay on his medication and continue to try to work the appropriate channels of talking with his guardian or other caregivers about considering a transfer to a different facility. We are going to contact his group home and see if we can get him back there. If they adamantly refuse we can consider discharge to the homeless shelter. He will still follow up with his usual outpatient provider.   DIAGNOSIS, PRINCIPAL AND PRIMARY:  AXIS I: Schizoaffective disorder, bipolar type.   SECONDARY DIAGNOSES: AXIS I: No further diagnosis.  AXIS II: Deferred.  AXIS III: Human immunodeficiency virus positive, high blood pressure, dyslipidemia, hypothyroid.  ____________________________ Audery Amel, MD jtc:sb D: 03/01/2014 11:57:01 ET T: 03/01/2014 13:56:16 ET JOB#: [id number]  cc: Audery Amel, MD, <Dictator> Audery Amel MD ELECTRONICALLY SIGNED 03/04/2014 11:19

## 2014-07-14 NOTE — Consult Note (Signed)
Psychiatry: Follow-up for this gentleman with schizophrenia in the emergency room.  On reinterviewed today the patient states that he has no thoughts of hurting himself or anyone else.  He states that he regrets that he tried to manipulate his way back in the hospital.  He promises to stay on his medication.  He is requesting to go back to his group home so that he can prepare for possibly having to find a new place to live.  Patient does continue to have hallucinations but this is been a chronic situation for him and not acute.  Does not appear to be acutely dangerous. this point patient does not require hospital treatment and would not be likely to benefit from long-term treatment at [facility].  He will be allowed to go back to his group home.  No change to medicine.  Supportive counseling completed.  Diagnosis schizophrenia.  Involuntary commitment discontinued.  Electronic Signatures: Audery Amellapacs, Margareth Kanner T (MD)  (Signed on 23-Nov-15 18:55)  Authored  Last Updated: 23-Nov-15 18:55 by Audery Amellapacs, Dael Howland T (MD)

## 2014-07-14 NOTE — Consult Note (Signed)
PATIENT NAME:  [name], [name] MR#:  [id number] DATE OF BIRTH:  04-29-1960  DATE OF CONSULTATION:  02/01/2014  REFERRING PHYSICIAN:   CONSULTING PHYSICIAN:  [name], MD  IDENTIFYING INFORMATION AND REASON FOR CONSULTATION: A 54 year old man with a history of schizoaffective disorder and multiple medical problems who was petitioned to the hospital because of making violent threats towards people at his group home.   CHIEF COMPLAINT: "I've been taking this class."   HISTORY OF PRESENT ILLNESS: Information obtained from the patient and the chart. The commitment paperwork indicates that he had made threats stating that he was going to kill people at his group home and kill someone named Rosebud. He tells the story that he was angry yesterday because this person, Edrick KinsRosebud, who is apparently a friend of his got him to give her probably several hundred dollars of his money and now he realizes he is not going to get it back. He was upset because the group home was not giving him any money at the moment to buy cigarettes or get anything else he needed. He is feeling very frustrated. He admits that he made statements about killing someone. After he did that he made statements about walking out in traffic and letting the cars run me over. He now says that he realizes that was a mistake and that he does not actually want to hurt himself or hurt anyone else. He says he has been only partially compliant with his medicine, flushing it down the toilet every few days or so. Says he has been drinking the occasional beer, although he will only admit to maybe 1 every week or so. Denies that he is abusing any other drugs. His sleep has been poor. His mood has been more poor recently. He is taking a class currently at [facility] and apparently he got a grant to do that and now is worried that he will not pass the class.   PAST PSYCHIATRIC HISTORY: Long history of schizoaffective disorder with multiple hospitalizations including  extended stays at [facility]. Currently, he has been stabilized on multiple mood stabilizers and antipsychotics. Has a past history of lability and aggression and agitated behavior, especially when his medicines are not stable.   SOCIAL HISTORY: Lives at a group home. Has ACT team support. He claims that he has been given a 30 day notice to get out of the group home and he does not know where to go after that.   PAST MEDICAL HISTORY: The patient has HIV disease, also has prostate enlargement, also has hypothyroidism, high blood pressure, chronic constipation and elevated lipids.   FAMILY HISTORY: He says he has a cousin with mental illness.   SUBSTANCE ABUSE HISTORY: He has a past history of alcohol use. He used to be more heavy, but he has cut way back on it, now is not using. There have been times in the more distant past where he might have dabbled in drugs, but it has not been a major part of his problem and he is not using any drugs recently.   REVIEW OF SYSTEMS: This patient currently denies any acute suicidal or homicidal ideation. Admits that he is having hallucinations at times. Physically he is feeling a little run down. No other acute physical symptoms. The rest of the review of systems is negative.    MENTAL STATUS EXAMINATION: This is a 54 year old gentleman who looks his stated age who was basically cooperative. For the first part of the interview he  would not sit up or make eye contact. He was slurring his speech and seemed tired. After he roused himself and went to the restroom he was communicating better. Made good eye contact. Psychomotor activity is still a little sluggish. Speech is more normal in rate and tone. Affect smiling, reactive, a little regretful. Mood is stated as being not so good. Thoughts are a little scattered and disorganized. Did not make any obviously bizarre statements. Admits that he has hallucinations. Denies acute suicidal or homicidal ideation,  but has recently been impulsive about it. The patient is alert and oriented x4. Judgment and insight improved. Can remember 3/3 objects immediately, 3/3 at three minutes. Normal intelligence.   LABORATORY RESULTS: Chemistry panel: Slightly elevated chloride and low calcium, very slight, no significant. Alcohol level negative. Lithium level normal at 0.73. Drug screen only positive for benzodiazepines, which is consistent with his prescribed medicine. His CBC shows just a slightly elevated MCV but otherwise normal indices. Urinalysis noninfected.   CURRENT MEDICATIONS: Xanax 1 mg twice a day, aspirin 81 mg a day, Cogentin 2 mg twice a day, Coreg 3.125 mg twice a day, vitamin D3 2,000 units a day, Depakote 500 mg in the morning and 1000 at night, docusate 100 mg at night, Atripla combination pill 1 tablet daily, Prolixin 15 mg at night, Synthroid 25 mcg per day, lithium 600 mg twice a day, Zocor 20 mg at night, Flomax 0.4 mg at night, Hytrin 1 mg at night.   ALLERGIES: No known drug allergies.   VITAL SIGNS: Blood pressure is currently 93/51, respirations 16, pulse 86, and temperature 97.   ASSESSMENT: A 54 year old man with schizoaffective disorder. He has recently been more labile and agitated, but he is not nearly as bad as we have seen him at times in the past. The patient and I were able to discuss his situation and he was able to show enough insight to negotiate realistically about what would be helpful. We agreed that admitting him to the hospital perhaps for only a brief time to stabilize him on his medicine would help him to feel better and smooth things over with his discharge and hopefully help him be more successful when he went home.   TREATMENT PLAN: Admit to psychiatry. Continue all medicines. I did not mention that he also gets an [place] 156 mg per month shot. I am not sure when he last got that so I will not order it right now. Close and elopement precautions in place.    DIAGNOSIS, PRINCIPAL AND PRIMARY:  AXIS I: Schizoaffective disorder, bipolar type.   SECONDARY DIAGNOSES: AXIS I: No further.  AXIS II: Deferred.  AXIS III: HIV-positive, prostate hypertrophy, dyslipidemia, high blood pressure, hypothyroidism.  ____________________________ Audery Amel, MD jtc:sb D: 02/01/2014 11:03:10 ET T: 02/01/2014 11:23:16 ET JOB#: [id number]  cc: Audery Amel, MD, <Dictator> Audery Amel MD ELECTRONICALLY SIGNED 02/05/2014 17:04

## 2014-07-14 NOTE — Consult Note (Signed)
Brief Consult Note: Diagnosis: schizoafffective disorder.   Patient was seen by consultant.   Consult note dictated.   Discussed with Attending MD.   Comments: Psychiatry: Patient seen. Well known to us with longstanding schizoaffective disorder. Was agitated and a little hostile at home. Now regrets it and denies any SI or HI. Agrees to take his medication. Baseline mental state. Can go back to group home and fu with ACT team.  Electronic Signatures: Audery Amellapacs, Mervil Wacker T (MD)  (Signed 02-Jan-15 13:21)  Authored: Brief Consult Note   Last Updated: 02-Jan-15 13:21 by Audery Amellapacs, Lakeesha Fontanilla T (MD)

## 2014-07-14 NOTE — Consult Note (Signed)
PATIENT NAME:  [name], [name] MR#:  [id number] DATE OF BIRTH:  [birth year]  DATE OF CONSULTATION:  01/01/2014  REFERRING PHYSICIAN:  Sharyn Creamer, MD CONSULTING PHYSICIAN:  Ardeen Fillers. Garnetta Buddy, MD  REASON FOR CONSULTATION: "I told the police that I would take the gun and go back and kill the people in the group home."   HISTORY OF PRESENT ILLNESS: The patient is a 54 year old African American male who was brought in by the [place] police on IVC. The patient reported that he got into an argument with the staff at the group home as he was having problems over there. He reported that his mind would jump to conclusion without thinking. He stated that he was threatening his caregiver and wanted a gun to blow out the brain. He reported that he got his briefcase and took off to the store. He thought that he would get a gun, but "I only have 1 dollar and 50 cents in cash." He reported that he was upset at the group home as they were calling his momma a name. The patient reported that the staff was very mean to him and he made a comment and she hoped that he would get run over by a car on the way to the store. The patient was stopped by the police on the road and he told them that he would take their gun and go back and kill the staff. The patient reported that he only made the comment because he was upset and he wanted to run away from the group home. He was not having any intention to hurt anybody. The patient has history of schizophrenia and he has been depressed since the death of his mother earlier this year. He is currently being followed by the [facility] ACT team and has been taking his [place] on a regular basis. The patient just received his [place] on 12/26/2013.   PAST PSYCHIATRIC HISTORY: The patient has long history of schizoaffective disorder with history of manic episodes in the past. He has multiple hospitalizations including in long-term state hospitals. He tends to get manic and agitated at  times. He is currently being followed by the [facility] ACT team. The patient has never attempted suicide in the past.   SUBSTANCE ABUSE HISTORY: The patient does not use any drugs or alcohol at this time.   PAST MEDICAL HISTORY: The patient is HIV-positive and he is followed by the HIV clinic. He also has history of hypertension and hypothyroidism related to his medications as well as dyslipidemia.   SOCIAL HISTORY: The patient currently lives in a group home. He is managed by the ACT team. His family is not involved in his care.   FAMILY HISTORY: Unknown.   CURRENT MEDICATIONS: Simvastatin 20 mg daily, terazosin 1 mg at bedtime, lithium 300 mg b.i.d., Depakote 500 mg in the morning and 1000 at bedtime, benztropine 2 mg b.i.d., Invega Sustenna 150 mg q. monthly, aspirin 81 mg daily, tamsulosin 0.4 mg once daily, carvedilol 3.125 mg b.i.d., cholecalciferol 2000 units once daily, fluphenazine 5 mg 3 tablets once daily at bedtime, docusate sodium 100 mg at bedtime, Xanax 1 mg b.i.d., Atripla 600/200 mg once daily.   ALLERGIES: No known drug allergies.   REVIEW OF SYSTEMS: CONSTITUTIONAL: The patient currently denies any fever or chills.  EYES: No double or blurred vision.  RESPIRATORY: No shortness of breath or cough.  CARDIOVASCULAR: No chest pain or orthopnea.  GASTROINTESTINAL: No abdominal pain, nausea, vomiting, or diarrhea. GENITOURINARY: No  incontinence or frequency.  ENDOCRINE: No heat or cold intolerance.  LYMPHATIC: No anemia or easy bruising.  MUSCULOSKELETAL: No muscle or joint pain.  NEUROLOGIC: No tingling or weakness.   VITAL SIGNS: Temperature 97.4, pulse 89, respirations 18, blood pressure 121/83.  LABORATORY DATA: Glucose 103, BUN 7, creatinine 0.99, sodium 142, potassium 2.7, chloride 110, bicarbonate 27. Anion gap 5. Osmolality 281. Calcium 7.9. Blood alcohol less than 3. Protein 6.5, albumin 3.3, bilirubin 0.2, alkaline phosphatase 87, AST 25, ALT 8. Lithium carbonate level  1.03. Valproic acid level 83. UDS is positive for benzodiazepines. WBC 5.3, hemoglobin 13.4, and hematocrit 40.2.   MENTAL STATUS EXAMINATION: The patient is a disheveled-appearing male who was lying in the bed. He appeared calm and cooperative. He maintained fair eye contact. Psychomotor activity was retarded. He was able to communicate, but speech was somewhat slow. No loose associations are noted. Thought content was fair. Denied having any suicidal ideations or plans. No perceptual disturbances were noted. He has poor insight and judgment regarding his psychiatric condition. Recent and remote memory were intact. Attention span and concentration were fair. His mood was depressed. Affect was congruent.   DIAGNOSTIC IMPRESSION: AXIS I: Schizoaffective disorder, bipolar type.  AXIS II: None.  AXIS III: HIV-positive, hypertension, dyslipidemia hypothyroidism.  AXIS IV: Severe.  AXIS V: Current global assessment of functioning 50.   TREATMENT PLAN:  1.  The patient and will be released from involuntary commitment.  2.  He will be discharged back to his group home.  3.  I will advise the ACT team to decrease his lithium dose as his lithium level is slightly elevated. He will continue the other medications as prescribed. The patient will be discharged when he becomes clinically stable.   Thank you for allowing me to participate in the care of this patient.   ____________________________ Ardeen FillersUzma S. Garnetta BuddyFaheem, MD usf:sb D: 01/01/2014 11:57:03 ET T: 01/01/2014 12:14:15 ET JOB#: [id number]  cc: Ardeen FillersUzma S. Garnetta BuddyFaheem, MD, <Dictator> Rhunette CroftUZMA S Jesusmanuel Erbes MD ELECTRONICALLY SIGNED 01/01/2014 14:08
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
## 2014-07-15 NOTE — Consult Note (Signed)
Brief Consult Note: Diagnosis: Paranoid schizophrenia.   Patient was seen by consultant.   Consult note dictated.   Recommend further assessment or treatment.   Orders entered.   Discussed with Attending MD.   Comments: [name] has a long h/o mental illness [facility] He is in care of Dr. Cherylann Benitez and [facility] team. He has been compliant with medications as evidenced by therapeutic VPA level 72. He was given his scheduled Risperdal consta injection on 05/19/11. He called police last night when not allowed to have cigarretes as he smokes in his room. He was consulted by a SOC last night, diagnosed with paranoid schizophrenia, with recommendation to admit to inpatient unit.   MSE: I saw [name] in the ER on 04/27/11 when he came here in a similar situation. He is cool and collected, denies thoughts of hurting self or others, he is not overtly psychotic. He admits that all his problems  "are always about cigarettes and money".   PLAN: 1. The patient no longer meets criteria for IVC. I will terminate proceedings. Please discharge as appropriate.  2. He is to continue all his medications as prescribed by his primary psychiatrist.  3. He will follow up with [facility] team for mental health.  4. He will follow up with Dr. Leavy Benitez for HIV treatment.  Electronic Signatures: Kristine LineaPucilowska, Ornella Coderre (MD)  (Signed [phone]-Feb-13 13:41)  Authored: Brief Consult Note   Last Updated: 28-Feb-13 13:41 by Kristine LineaPucilowska, Demosthenes Virnig (MD)
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
## 2014-07-15 NOTE — Consult Note (Signed)
Brief Consult Note: Diagnosis: Paranoid schizophrenia.   Patient was seen by consultant.   Consult note dictated.   Recommend further assessment or treatment.   Orders entered.   Discussed with Attending MD.   Comments: [name] has a long h/o mental illness [facility] He is in care of Dr. Cherylann RatelLateef and [facility]. He has been compliant with medications as evidenced by therapeutic VPA level.He is up for injection of Risperdal consta injection. He called police last night when he became angry with group home staff. He was taken to jail and charged with disorderly conduct.    He is tearful and sad today even though he has learned that he is allowed to return to his old group home. He awaits second injection of invega sustenna on 4/27.  MSE: Alert, oriented, pleasant, polite, withdrawam, slightly tearful.  He is not suicidal or homicidal. He is paranoid and delusional. Denies hallucinations, writes copious notes. He accepts medications, including decanoate injection.  PLAN: 1. [name] is on wait list for [facility]. He has been in our ER every week.   2. He is to continue all Depakote. We will increase Risperdal to 3 mg twice daily. We will d/c risperdal consta and start Invega sustenna. Second injection on saturday.   3. We will continue all other medications as prescribed by PCP and Dr. Leavy CellaBlocker.  4. I will follow up.  Electronic Signatures: Kristine LineaPucilowska, Jolanta (MD)  (Signed 25-Apr-13 21:40)  Authored: Brief Consult Note   Last Updated: 25-Apr-13 21:40 by Kristine LineaPucilowska, Jolanta (MD)

## 2014-07-15 NOTE — Consult Note (Signed)
Brief Consult Note: Diagnosis: Paranoid schizophrenia.   Patient was seen by consultant.   Recommend further assessment or treatment.   Orders entered.   Discussed with Attending MD.   Comments: [name] has a long h/o mental illness [facility]. He is in care of Dr. Cherylann RatelLateef and [facility]. He has been compliant with medications as evidenced by therapeutic VPA level.He is up for injection of Risperdal consta injection. He called police last night when he became angry with group home staff. He was taken to jail and charged with disorderly conduct.    He is tearful and sad today even though he has learned that he is now allowed to return to his old group home. He received second injection of invega sustenna on 4/27.  MSE: Alert, oriented, pleasant, polite, withdrawam, slightly tearful.  He is not suicidal or homicidal. He is paranoid and delusional. Denies hallucinations, writes copious notes. He accepts medications, including decanoate injection.  PLAN: 1. [name] is on wait list for [facility]. He will likely have a bed on Tuesday.   2. He is to continue all Depakote. I will discontinue oral risperdal. He will get another Invega sustenna injection in 3-4 weeks.   3. We will continue all other medications as prescribed by PCP and Dr. Leavy CellaBlocker.  4. I will follow up.  Electronic Signatures: Kristine LineaPucilowska, Kaycee Haycraft (MD)  (Signed 29-Apr-13 09:38)  Authored: Brief Consult Note   Last Updated: 29-Apr-13 09:38 by Kristine LineaPucilowska, Avice Funchess (MD)

## 2014-07-15 NOTE — Consult Note (Signed)
Brief Consult Note: Diagnosis: schizoaffective disorder.   Patient was seen by consultant.   Recommend further assessment or treatment.   Orders entered.   Comments: Psychiatry: Patient seen. Chart reviewed. PAtient with schizoaffective disorder and history of multiple hospitalizations sent from group home for being aggressive and threatening. PAtient is currently hyperverbal, pressured, agitated and becomes menacing easily. Poor insight. Due to agitation and his history he is more appropriate [facility]. Orders entered for medications per last record. Will moniter and follow in [facility] while [facility].  Electronic Signatures: Audery Amellapacs, John T (MD)  (Signed 13-Jun-13 23:41)  Authored: Brief Consult Note   Last Updated: 13-Jun-13 23:41 by Audery Amellapacs, John T (MD)

## 2014-07-15 NOTE — Consult Note (Signed)
Brief Consult Note: Diagnosis: Paranoid schizophrenia.   Patient was seen by consultant.   Recommend further assessment or treatment.   Orders entered.   Discussed with Attending MD.   Comments: [name] has a long h/o mental illness [facility]. He is in care of Dr. Cherylann RatelLateef and [facility]. He has been compliant with medications as evidenced by therapeutic VPA level.He is up for injection of Risperdal consta injection. He called police last night when he became angry with group home staff. He was taken to jail and charged with disorderly conduct.     MSE: Alert, oriented, pleasant, polite, intrusive, talkative, slioghtly tearful. He complains that the group home owner is trying to "get rid" of him and that ACT team pays him no attention and considers him a criminal rather than mentally ill. He is not suicidal or homicidal. He is paranoid and delusional. Denies hallucinations, writes copious notes. He accepts medications, including decanoate injection.  PLAN: 1. [name] is on wait list for [facility]. He has been in our ER every week.   2. He is to continue all Depakote. We will increase Risperdal to 3 mg twice daily. We will d/c risperdal consta and start Invega sustenna. Second injection on Friday.   3. We will continue all other medications as prescribed by PCP and Dr. Leavy CellaBlocker.  4. I will follow up.  Electronic Signatures: Kristine LineaPucilowska, Marnae Madani (MD)  (Signed 24-Apr-13 09:59)  Authored: Brief Consult Note   Last Updated: 24-Apr-13 09:59 by Kristine LineaPucilowska, Jaley Yan (MD)

## 2014-07-15 NOTE — Consult Note (Signed)
Brief Consult Note: Diagnosis: Paranoid schizophrenia.   Patient was seen by consultant.   Recommend to proceed with surgery or procedure.   Recommend further assessment or treatment.   Orders entered.   Discussed with Attending MD.   Comments: [name] has a long h/o mental illness [facility]. He is in care of Dr. Cherylann RatelLateef and [facility]. He has been compliant with medications as evidenced by therapeutic VPA level.He is up for injection of Risperdal consta injection. He called police last night when he became angry with group home staff. He was taken to jail and charged with disorderly conduct.     PLAN: 1. [name] is on wait list for [facility]. He has been in our ER every week.   2. He is to continue all Depakote. We will increase Risperdal to 3 mg twice daily. We will d/c risperdal consta and start Invega sustenna.   3. We will continue all other medications as prescribed by PCP and Dr. Leavy CellaBlocker.  4. I will follow up.  Electronic Signatures: Kristine LineaPucilowska, Kateena Degroote (MD)  (Signed 22-Apr-13 12:58)  Authored: Brief Consult Note   Last Updated: 22-Apr-13 12:58 by Kristine LineaPucilowska, Sokha Craker (MD)
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
## 2014-07-15 NOTE — Consult Note (Signed)
Details:    - Psychiatry: Patient seen.Patient with bipolar disorder who has been in the ER for days awaiting [facility] transfer. Patient has been back on his medication and complient. He still yells at times but is not threatening and is able to calm himself down. Tolerant of medication. Denies any SI or HI. Patient at this point not likely to benifit further from in pt treatment. A group home has been identified for him and he has outpt followup in place from ACT team. Patient at this time is safe for discharge given the level of supervision and ongoing treatment in place. He agrees to the plan. Spoke with ER doctor Buford DresserFinnell who agrees with plan. Patient counceled on the importance of staying on medication and working on controlling his temper and not frightening people. Will discharge him today and release commitment. Continue all medication including for HIV.   Electronic Signatures: Audery Amellapacs, Haziel Molner T (MD)  (Signed 20-Jun-13 12:15)  Authored: Details   Last Updated: 20-Jun-13 12:15 by Audery Amellapacs, Dawsyn Ramsaran T (MD)

## 2014-07-22 NOTE — Consult Note (Signed)
PATIENT NAME:  [name], [name] MR#:  [id number] DATE OF BIRTH:  September 21, 1960  DATE OF CONSULTATION:  07/14/2014  CONSULTING PHYSICIAN:  Linsy Ehresman K. Xai Frerking, MD  HISTORY OF PRESENT ILLNESS: The patient [facility]. The patient is a 54 year old African American male who is not employed and last worked more than 10 years ago at [employer], and he was going from 1 group home to another and did not have a definite place and so he had to leave. The patient is single, never married, and has been living at group homes for the past 20 years or so. Currently, he lives at a group home called [facility] and he has been here off and on for about 3 years. The patient called the ambulance with a chief complaint "I want to get out of this group home and I don't want to be here no more."   HISTORY OF PRESENT ILLNESS: The patient reports that he does not have much money and whatever money he has is taken away and he has no spending money and this upset him, and so he decided to come here for help. The patient was explained that the money that he is getting is not enough to pay for his independent stay. The patient reports that he has long history of mental illness and was being followed on an outpatient basis and he is currently being followed by ACT team and they come and take care of him at the group home. However, he does not take his medications as recommended and one day he takes his medicine and one day he flushes them down the toilet because he is frustrated and upset.    ALCOHOL AND DRUGS: Has an occasional drink of alcohol. Denies street or prescription drug abuse. Does admit smoking nicotine cigarettes rate of 2 packs per day for many years.    MENTAL STATUS EXAMINATION: The patient is alert, oriented, calm, pleasant, and cooperative. Affect is neutral. Mood is stable. Denies feeling depressed. He is just upset and frustrated about his living situation and wanting to live  by himself and not having enough money to do so. No psychosis. Does not appear to be responding to internal stimuli. General knowledge of information is fair. He is alert and oriented. He knew the name of the place and cognition is intact. Denies suicidal or homicidal plans. Insight and judgment guarded. Impulse control is fair.  IMPRESSION: Schizoaffective disorder, appears to be currently frustrated about his living situation.  PLAN: Recommend to discharge the patient as he is a voluntary patient and he will go back to the current group home if they agree to take him back and he will try to make up with them and try to stay where he is because he does not have much resources to find a different place. He will be followed by ACT team and has enough medications at the group home.    ____________________________ Jannet MantisSurya K. Guss Bundehalla, MD skc:[id number] D: 07/14/2014 17:11:00 ET T: 07/14/2014 22:41:58 ET JOB#: [id number]  cc: Monika SalkSurya K. Guss Bundehalla, MD, <Dictator> Beau FannySURYA K Jermale Crass MD ELECTRONICALLY SIGNED 07/18/2014 12:44

## 2014-07-22 NOTE — Consult Note (Addendum)
PATIENT NAME:  [name], Myer MR#:  [id number] DATE OF BIRTH:  Feb 08, 1961  DATE OF CONSULTATION:  07/11/2014  REFERRING PHYSICIAN:   CONSULTING PHYSICIAN:  Audery AmelJohn T. Grainne Knights, MD  IDENTIFYING INFORMATION AND REASON FOR CONSULTATION: This is a 54 year old man with a history of schizoaffective disorder, who called 911 to have himself brought into the hospital. The patient's chief complaint to me today, "It's money and my smokes."   HISTORY OF PRESENT ILLNESS: The patient is telling me essentially the same thing that he reported to social work this morning. He said he got upset at his group home over some kind of argument about his cigarettes and his money. He called 911 to have himself brought over here thinking that the solution to his problems was [facility]. He now says that he realizes that was a mistake because it did not account for packing up his belongings. He denies that his mood has been any different than usual. Denies suicidal or homicidal ideation. Says that he has auditory and visual hallucinations, but those are chronic and have been there all his life and are no worse or different than usual. He complains quite a bit about the group home, which has been a chronic issue for him. He frequently has problems getting along with others, especially because he gets confused over money. It sounds like once again also there was an issue where he had either lent some money or been solicited to lend money to another mentally ill person who has a history of ripping him off. The patient says he has been compliant with all of his medicine, including all of his medication for his medical problems.   PAST PSYCHIATRIC HISTORY: Long history of schizoaffective disorder with multiple hospitalizations. At times, has been very agitated, aggressive, and violent, most especially when off his medicine. No known history of definite suicide attempts.   PAST MEDICAL HISTORY: The patient is  HIV positive. He gets followed up by an infectious disease specialist and says he is compliant with his medicine. Also has a history of prostate hypertrophy, dyslipidemia, high blood pressure, and hypothyroidism.   FAMILY HISTORY: Unknown.   SOCIAL HISTORY: Lives in a group home. Does not like it but he has chronically failed to responsibly take care of his own money and on his own usually ends up running out of money and that somehow coming back into the hospital. He has had followup from an ACT team, which has been an improvement for his stability.   CURRENT MEDICATIONS: Vitamin D 2000 international units a day, levothyroxine 25 mcg per day, docusate 100 mg once a day, carvedilol 3.125 mg twice a day, Atripla 1 tablet at night, lithium carbonate 600 mg twice a day, Invega [place] 156 mg intramuscular every 4 weeks, fluphenazine 5 mg in the morning and 10 mg at night, Cogentin 1 mg at night, simvastatin 20 mg at night, Depakote 500 mg in the morning and 1000 mg at night, terazosin 1 mg at bedtime, tamsulosin 0.4 mg at bedtime, aspirin 81 mg a day.   ALLERGIES: No known drug allergies.   REVIEW OF SYSTEMS: He currently endorses auditory and visual hallucinations, which are chronic. Denies suicidal or homicidal ideation. Denies any other physical symptoms. No pain. No GI complaints. Nothing else specific.   MENTAL STATUS EXAMINATION: A somewhat disheveled gentleman who looks his stated age. Cooperative with the interview. Eye contact good. Psychomotor activity slow. Speech is slow. Affect reactive and appropriate.  Mood is stated as fine. Thoughts are a little bit scattered and disorganized. Able to hold a reasonable conversation ultimately. Endorses hallucinations, but they do not bother him. Denies suicidal or homicidal ideation. He is alert and oriented x 4. Can repeat 3 words immediately, remembers all 3 at 3 minutes. Judgment chronically poor, insight adequate.   LABORATORY RESULTS: Urinalysis  unremarkable. Drug screen negative. Lithium level low at 0.2. Alcohol level negative. Chemistry panel unremarkable. CBC chronic anemia with a hemoglobin of 12.8.   VITAL SIGNS: Blood pressure most recently 106/78, respirations 18, pulse 81, temperature 98.   ASSESSMENT: This is a 54 year old man with a history of schizoaffective disorder. He was acting out impulsively when he called 911 to bring him here to the hospital yesterday. He is denying any suicidal or homicidal ideation or change in his symptoms. His affect and behavior have been calm and cooperative and typical of his baseline. I do not think this patient needs further hospital level treatment.   TREATMENT PLAN: Released from involuntary commitment. Counseling completed about the importance of working with his outpatient team if he is dissatisfied with his living situation to try and be realistic about what is possible for him. No change in medication indicated. Discontinue IVC and he can be released from the Emergency Room.   DIAGNOSIS, PRINCIPAL AND PRIMARY:  AXIS I: Schizoaffective disorder, bipolar type.   SECONDARY DIAGNOSES: AXIS I: No further diagnosis.  AXIS II: No diagnosis.  AXIS III: HIV positive, hypothyroid, high blood pressure, dyslipidemia.    ____________________________ Audery Amel, MD jtc:at D: 07/11/2014 15:46:16 ET T: 07/11/2014 15:58:54 ET JOB#: [id number]  cc: Audery Amel, MD, <Dictator> Audery Amel MD ELECTRONICALLY SIGNED 07/27/2014 19:28

## 2014-07-22 NOTE — Consult Note (Addendum)
PATIENT NAME:  [name], [name] MR#:  [id number] DATE OF BIRTH:  29-Jan-1961  DATE OF CONSULTATION:  06/26/2014  REFERRING PHYSICIAN:   CONSULTING PHYSICIAN:  Audery Amel, MD  IDENTIFYING INFORMATION AND REASON FOR CONSULTATION:  This is a 54 year old man with a history of bipolar disorder who came to the hospital voluntarily.   CHIEF COMPLAINT: "I just walked by the police station and said I was suicidal or something like that."   HISTORY OF PRESENT ILLNESS: Information from the patient and the chart.  The patient tells me the same thing he told the intake representative last night which is that he had gone to a police station and told them that he was thinking of killing himself, but that is in fact not true. He says his mood recently has been a little bit more down and tired, but not severely depressed, or hopeless. His sleep has been a little more erratic tending to stay awake at night and sleep during the day. He denies any substance abuse. He tells me he has not been having any actual suicidal ideation or homicidal ideation. His main complaint is about his chronic frustration with his group home and ACT team. [name] is always wanting to be moved to a more independent level and to go back to school, but his behaviors have severely limited his options. This is frustrating to him and he often tends to blame those who are working with him for the outcome. Currently on interview, he denies suicidal or homicidal ideation. Denies any current hallucinations. Says that he has been intermittently cooperative with his medicine, but will not give me much more information than that. He thinks they have made some changes to his medicine recently, but he does not have any idea what they are.   PAST PSYCHIATRIC HISTORY: Long history of bipolar disorder. He frequently presents to the Emergency Room more manic than he is today. During his manic episodes, he will frequently be very loud and psychotic and agitated,  although it is not clear that he has ever actually hurt anyone. No history of suicide attempts. He is followed by an ACT team in the community. Last time we saw him, he was on a combination of Depakote, Prolixin, and [place], lithium, plus some medicines for his medical conditions.   SOCIAL HISTORY: Resides in a group home. He is frustrated with it as noted above. I am not sure whether he has a guardian. Usually, he is at least fairly cooperative with the ACT team.   PAST MEDICAL HISTORY: The patient is HIV positive and follows up with Dr. Sampson Goon. Also history of benign prostatic hypertrophy, hypertension, hypothyroidism, dyslipidemia.   SUBSTANCE ABUSE HISTORY: No recent use of alcohol or drugs. Past occasional use of alcohol. Says that recently he may have had a couple of beers, but nothing in the last couple of days. Denies other drug use.   FAMILY HISTORY: Denies any history of mental illness.   REVIEW OF SYSTEMS:  Feeling a little more tired and down. Sleep is a little irregular. No other physical symptoms. Full 9 point review otherwise negative. Denies hallucinations. Denies suicidal or homicidal ideation.   MENTAL STATUS EXAMINATION: Slightly disheveled gentleman who looks his stated age. He was very passive and somewhat uncooperative with the interview. He made no eye contact at all with me. Made no effort to even turn to the face me. Psychomotor activity very limited. Speech was muffled and I had to ask him to move his  face away from a blanket to talk. Ultimately, he was able to communicate, but still decreased total amount of speech. Affect was blunted, although later in the interview he was smiling and laughing a little bit more. Mood is stated as being all right. Thoughts are lucid. No obvious delusions or loosening of associations. No obvious paranoia. Denies auditory or visual hallucinations. Denies suicidal or homicidal ideation. He is alert and oriented x 4. He can repeat 3  objects immediately, remembers 2 out of 3 at 3 minutes. His judgment and insight have some chronic impairment due to his illness.   CURRENT MEDICATIONS: As far as is known, he is still taking Depakote 500 mg 3 tablets at night, Cogentin 1 mg twice a day, levothyroxine 25 mcg once a day, docusate 100 mg at night, carvedilol 3.125 mg twice a day, Atripla 1 tablet once a day, fluphenazine 15 mg at night, aspirin 81 mg a day, simvastatin 20 mg at night, tamsulosin 0.4 mg once a day, terazosin 1 mg at night, lithium carbonate 600 mg twice a day, Invega [place] 156 mg every 4 weeks.   ALLERGIES: No known drug allergies.   LABORATORY RESULTS: Drug screen:  Slightly low calcium 8.8. Alcohol level negative. Drug screen all negative. CBC:  Low hemoglobin 12.4, low hematocrit 37.7, normal white count. Urinalysis:  Unremarkable.   VITAL SIGNS: Blood pressure 116/67, respirations 20, pulse 87, temperature 97.9.   ASSESSMENT: A 54 year old man with bipolar disorder who currently is slightly more depressed than manic. Not acutely psychotic. He told the police that he was having suicidal thoughts in a manipulative attempt to come into the hospital and last night was again insisting that he would not go back to his group home, but today he has relented on that. He tells me that he understands that he really does not have another option except going to the group home because [facility]. He is able to articulate an appropriate plan for working with his ACT team for further treatment planning.   TREATMENT PLAN: No change to medicine. Psycho education and supportive therapy completed. The patient can be discharged back to his group home and will follow up with [facility] ACT team. The case discussed with Emergency Room doctor.   DIAGNOSIS PRINCIPAL AND PRIMARY:   AXIS I: Bipolar disorder type I, depressed, mild.   SECONDARY DIAGNOSES:  AXIS I: No further.  AXIS II: No diagnosis.   AXIS III: HIV positive, prostatic hypertrophy, dyslipidemia, hypothyroid.    ____________________________ [name], MD jtc:sp D: 06/26/2014 13:12:30 ET T: 06/26/2014 13:34:52 ET JOB#: [id number]  cc: [name], MD, <Dictator> Audery AmelJOHN T CLAPACS MD ELECTRONICALLY SIGNED 07/27/2014 10:09

## 2014-07-29 DIAGNOSIS — Z720 Tobacco use: Secondary | ICD-10-CM | POA: Insufficient documentation

## 2014-07-29 DIAGNOSIS — Z79899 Other long term (current) drug therapy: Secondary | ICD-10-CM | POA: Diagnosis not present

## 2014-07-29 DIAGNOSIS — I10 Essential (primary) hypertension: Secondary | ICD-10-CM | POA: Insufficient documentation

## 2014-07-29 DIAGNOSIS — Z7982 Long term (current) use of aspirin: Secondary | ICD-10-CM | POA: Insufficient documentation

## 2014-07-29 DIAGNOSIS — F259 Schizoaffective disorder, unspecified: Secondary | ICD-10-CM | POA: Insufficient documentation

## 2014-07-29 DIAGNOSIS — Z590 Homelessness: Secondary | ICD-10-CM | POA: Diagnosis present

## 2014-07-29 NOTE — ED Notes (Addendum)
Pt states he does not like where he lives. "I want to be a summer time homeless person."

## 2014-07-30 ENCOUNTER — Encounter: Payer: Self-pay | Admitting: Emergency Medicine

## 2014-07-30 ENCOUNTER — Emergency Department
Admission: EM | Admit: 2014-07-30 | Discharge: 2014-07-30 | Disposition: A | Discharge status: Home / Self Care | Payer: Medicare Other | Attending: Emergency Medicine | Admitting: Emergency Medicine

## 2014-07-30 DIAGNOSIS — F259 Schizoaffective disorder, unspecified: Secondary | ICD-10-CM

## 2014-07-30 HISTORY — DX: Human immunodeficiency virus (HIV) disease: B20

## 2014-07-30 HISTORY — DX: Hyperlipidemia, unspecified: E78.5

## 2014-07-30 HISTORY — DX: Benign prostatic hyperplasia without lower urinary tract symptoms: N40.0

## 2014-07-30 HISTORY — DX: Hypothyroidism, unspecified: E03.9

## 2014-07-30 HISTORY — DX: Asymptomatic human immunodeficiency virus (hiv) infection status: Z21

## 2014-07-30 HISTORY — DX: Essential (primary) hypertension: I10

## 2014-07-30 HISTORY — DX: Schizoaffective disorder, unspecified: F25.9

## 2014-07-30 NOTE — ED Notes (Signed)
Report to board and care [name]. Transport will be here approx noon. Plan to feed and dress patient and wait in ER waiting room for transport.

## 2014-07-30 NOTE — ED Provider Notes (Signed)
-----------------------------------------   7:41 AM on 07/30/2014 -----------------------------------------   BP 120/68 mmHg  Pulse 80  Temp(Src) 97.7 F (36.5 C) (Oral)  Resp 18  Ht 6' (1.829 m)  Wt 175 lb (79.379 kg)  BMI 23.73 kg/m2  SpO2 99%  The patient had no acute events since last update.  Calm and cooperative at this time.  Pending disposition back to group home.   Arelia Longestavid M Zea Kostka, MD 07/30/14 [phone]

## 2014-07-30 NOTE — ED Notes (Addendum)
BEHAVIORAL HEALTH ROUNDING Patient sleeping: NO Patient alert and oriented: YES Behavior appropriate: YES Nutrition and fluids offered: YES Toileting and hygiene offered: YES Sitter present: NO Law enforcement present: YES 

## 2014-07-30 NOTE — ED Notes (Signed)
Patient in room, lying on bed with eyes closed; appears to be resting comfortably, sleeping soundly. Shows no s/sx of acute distress. Even, unlabored resp noted. Will continue to monitor.

## 2014-07-30 NOTE — Discharge Instructions (Signed)
[name]'s evaluation in the emergency department was reassuring today.  He should follow-up with his regular doctor at the next available appointment, or he can also follow up with [facility] at the contact information below.  [facility] [address] [place], [place] [address] Phone:  [phone] or [phone]  Open Access:   Walk-in ASSESSMENT hours, M-W-F, 8:00am - 3:00pm Advanced Acess CRISIS:  M-F, 8:00am - 8:00pm Outpatient Services Office Hours:  M-F, 8:00am - 5:00pm  Please return to the Emergency Department (ED)  immediately if you have thoughts of hurting yourself or anyone else, so that we may help you.  Please avoid alcohol and drug use.  Follow up with your doctor and/or therapist as soon as possible regarding today's ED  visit.   Please follow up any other recommendations and clinic appointments provided by the psychiatry team that saw you in the Emergency Department.    Schizoaffective Disorder Schizoaffective disorder (ScAD) is a mental illness. It causes symptoms that are a mixture of schizophrenia (a psychotic disorder) and an affective (mood) disorder. The schizophrenic symptoms may include delusions, hallucinations, or odd behavior. The mood symptoms may be similar to major depression or bipolar disorder. ScAD may interfere with personal relationships or normal daily activities. People with ScAD are at increased risk for job loss, social isolation,physical health problems, anxiety and substance use disorders, and suicide. ScAD usually occurs in cycles. Periods of severe symptoms are followed by periods of less severe symptoms or improvement. The illness affects men and women equally but usually appears at an earlier age (teenage or early adult years) in men. People who have family members with schizophrenia, bipolar disorder, or ScAD are at higher risk of developing ScAD. SYMPTOMS  At any one time, people with ScAD may have  psychotic symptoms only or both psychotic and mood symptoms. The psychotic symptoms include one or more of the following:  Hearing, seeing, or feeling things that are not there (hallucinations).   Having fixed, false beliefs (delusions). The delusions usually are of being attacked, harassed, cheated, persecuted, or conspired against (paranoid delusions).  Speaking in a way that makes no sense to others (disorganized speech). The psychotic symptoms of ScAD may also include confusing or odd behavior or any of the negative symptoms of schizophrenia. These include loss of motivation for normal daily activities, such as bathing or grooming, withdrawal from other people, and lack of emotions.  The mood symptoms of ScAD occur more often than not. They resemble major depressive disorder or bipolar mania. Symptoms of major depression include depressed mood and four or more of the following:  Loss of interest in usually pleasurable activities (anhedonia).  Sleeping more or less than normal.  Feeling worthless or excessively guilty.  Lack of energy or motivation.  Trouble concentrating.  Eating more or less than usual.  Thinking a lot about death or suicide. Symptoms of bipolar mania include abnormally elevated or irritable mood and increased energy or activity, plus three or more of the following:   More confidence than normal or feeling that you are able to do anything (grandiosity).  Feeling rested with less sleep than normal.   Being easily distracted.   Talking more than usual or feeling pressured to keep talking.   Feeling that your thoughts are racing.  Engaging in high-risk activities such as buying sprees or foolish business decisions. DIAGNOSIS  ScAD is diagnosed through an assessment by your health care provider. Your health care provider will observe and  ask questions about your thoughts, behavior, mood, and ability to function in daily life. Your health care provider may  also ask questions about your medical history and use of drugs, including prescription medicines. Your health care provider may also order blood tests and imaging exams. Certain medical conditions and substances can cause symptoms that resemble ScAD. Your health care provider may refer you to a mental health specialist for evaluation.  ScAD is divided into two types. The depressive type is diagnosed if your mood symptoms are limited to major depression. The bipolar type is diagnosed if your mood symptoms are manic or a mixture of manic and depressive symptoms TREATMENT  ScAD is usually a lifelong illness. Long-term treatment is necessary. The following treatments are available:  Medicine. Different types of medicine are used to treat ScAD. The exact combination depends on the type and severity of your symptoms. Antipsychotic medicine is used to control psychotic symptomssuch as delusions, paranoia, and hallucinations. Mood stabilizers can even the highs and lows of bipolar manic mood swings. Antidepressant medicines are used to treat major depressive symptoms.  Counseling or talk therapy. Individual, group, or family counseling may be helpful in providing education, support, and guidance. Many people with ScAD also benefit from social skills and job skills (vocational) training. A combination of medicine and counseling is usually best for managing the disorder over time. A procedure in which electricity is applied to the brain through the scalp (electroconvulsive therapy) may be used to treat people with severe manic symptoms that do not respond to medicine and counseling. HOME CARE INSTRUCTIONS   Take all your medicine as prescribed.  Check with your health care provider before starting new prescription or over-the-counter medicines.  Keep all follow up appointments with your health care provider. SEEK MEDICAL CARE IF:   If you are not able to take your medicines as prescribed.  If your  symptoms get worse. SEEK IMMEDIATE MEDICAL CARE IF:   You have serious thoughts about hurting yourself or others. Document Released: 07/20/2006 Document Revised: 07/24/2013 Document Reviewed: 10/21/2012 [facility] Patient Information 2015 [employer] [place]. This information is not intended to replace advice given to you by your health care provider. Make sure you discuss any questions you have with your health care provider.

## 2014-07-30 NOTE — ED Provider Notes (Signed)
[facility] Emergency Department Provider Note  ____________________________________________  Time seen: Approximately 4:43 AM  I have reviewed the triage vital signs and the nursing notes.   HISTORY  Chief Complaint Homeless    HPI Daniel MoundJaime Benitez is a 54 y.o. male who is well-known to the emergency department and has a history of schizoaffective disorder who presents because he does not like his group home.  He had a similar presentation about 3 weeks ago.  He does not have any specific psychiatric complaints or concerns and denies any acute medical problems, he just does not want to live at the group home.  As per Dr. Toni Amendlapacs note during his prior visit, he is chronically unable to manage his money or livers possibly, so he needs to live in a group home environment.He denies SI/HI today.   Past Medical History  Diagnosis Date  . HIV (human immunodeficiency virus infection)   . BPH (benign prostatic hypertrophy)   . Dyslipidemia   . Hypertension   . Hypothyroidism   . Schizoaffective disorder     There are no active problems to display for this patient.   History reviewed. No pertinent past surgical history.  Current Outpatient Rx  Name  Route  Sig  Dispense  Refill  . aspirin 81 MG tablet   Oral   Take 81 mg by mouth daily.         . benztropine (COGENTIN) 1 MG tablet   Oral   Take 1 mg by mouth at bedtime.         . carvedilol (COREG) 3.125 MG tablet   Oral   Take 3.125 mg by mouth 2 (two) times daily with a meal.         . cholecalciferol (VITAMIN D) 1000 UNITS tablet   Oral   Take 2,000 Units by mouth daily.         . divalproex (DEPAKOTE) 500 MG DR tablet   Oral   Take 500 mg by mouth every morning.         . divalproex (DEPAKOTE) 500 MG DR tablet   Oral   Take 1,000 mg by mouth at bedtime.         . docusate sodium (COLACE) 100 MG capsule   Oral   Take 100 mg by mouth daily.         Marland Kitchen.  efavirenz-emtricitabine-tenofovir (ATRIPLA) 600-200-300 MG per tablet   Oral   Take 1 tablet by mouth at bedtime.         . fluPHENAZine (PROLIXIN) 10 MG tablet   Oral   Take 10 mg by mouth at bedtime.         . fluPHENAZine (PROLIXIN) 5 MG tablet   Oral   Take 5 mg by mouth every morning.         Marland Kitchen. levothyroxine (SYNTHROID, LEVOTHROID) 25 MCG tablet   Oral   Take 25 mcg by mouth daily before breakfast.         . lithium 600 MG capsule   Oral   Take 600 mg by mouth 2 (two) times daily with a meal.         . paliperidone (INVEGA SUSTENNA) 156 MG/ML SUSP injection   Intramuscular   Inject 156 mg into the muscle every 30 (thirty) days.         . simvastatin (ZOCOR) 20 MG tablet   Oral   Take 20 mg by mouth daily.         .Marland Kitchen  tamsulosin (FLOMAX) 0.4 MG CAPS capsule   Oral   Take 0.4 mg by mouth at bedtime.         Marland Kitchen. terazosin (HYTRIN) 1 MG capsule   Oral   Take 1 mg by mouth at bedtime.           Allergies Review of patient's allergies indicates no known allergies.  History reviewed. No pertinent family history.  Social History History  Substance Use Topics  . Smoking status: Current Every Day Smoker -- 2.00 packs/day    Types: Cigarettes  . Smokeless tobacco: Not on file  . Alcohol Use: Yes    Review of Systems Constitutional: No fever/chills Eyes: No visual changes. ENT: No sore throat. Cardiovascular: Denies chest pain. Respiratory: Denies shortness of breath. Gastrointestinal: No abdominal pain.  No nausea, no vomiting.  No diarrhea.  No constipation. Genitourinary: Negative for dysuria. Musculoskeletal: Negative for back pain. Skin: Negative for rash. Neurological: Negative for headaches, focal weakness or numbness. Psychiatric:Denies SI or HI, does not like where he lives. 10-point ROS otherwise negative.  ____________________________________________   PHYSICAL EXAM:  VITAL SIGNS: ED Triage Vitals  Enc Vitals Group     BP  07/29/14 2342 116/72 mmHg     Pulse Rate 07/29/14 2342 89     Resp 07/29/14 2342 20     Temp 07/29/14 2342 97.7 F (36.5 C)     Temp Source 07/29/14 2342 Oral     SpO2 07/29/14 2342 97 %     Weight 07/29/14 2342 175 lb (79.379 kg)     Height 07/29/14 2342 6' (1.829 m)     Head Cir --      Peak Flow --      Pain Score --      Pain Loc --      Pain Edu? --      Excl. in GC? --     Constitutional: Disheveled at baseline.  No acute distress  Eyes: Conjunctivae are normal. PERRL. EOMI. Head: Atraumatic. Nose: No congestion/rhinnorhea. Mouth/Throat: Mucous membranes are moist.  Oropharynx non-erythematous. Neck: No stridor.   Cardiovascular: Normal rate, regular rhythm. Grossly normal heart sounds.  Good peripheral circulation. Respiratory: Normal respiratory effort.  No retractions. Lungs CTAB. Gastrointestinal: Soft and nontender. No distention. No abdominal bruits. No CVA tenderness. Musculoskeletal: No lower extremity tenderness nor edema.  No joint effusions. Neurologic:  Baseline without neurological deficits  Skin:  Skin is warm, dry and intact. No rash noted. Psychiatric: Baseline mental status.  Denies SI or HI.  Does not like where he lives.  States he has been compliant with his medications  ____________________________________________   LABS (all labs ordered are listed, but only abnormal results are displayed)  Labs Reviewed - No data to display ____________________________________________   INITIAL IMPRESSION / ASSESSMENT AND PLAN / ED COURSE  Pertinent labs & imaging results that were available during my care of the patient were reviewed by me and considered in my medical decision making (see chart for details).  The patient is at his baseline and is here because he does not like his group home.  He does not require emergent psychiatric evaluation or any sort of lab workup at this time.  I carefully reviewed Dr. Toni Amendlapacs note from less than 3 weeks ago and the  presentation is nearly identical.  We will the patient to rest here and then discharge him back to his group home to follow up with his outpatient resources.  I will also provide information about  [facility]. ____________________________________________   FINAL CLINICAL IMPRESSION(S) / ED DIAGNOSES  Final diagnoses:  Schizoaffective disorder, unspecified type     Loleta Rose, MD 07/31/14 1314

## 2014-07-30 NOTE — ED Notes (Signed)
Pt uprite on stretcher in exam room with no distress noted; pt reports he is here for "mental problems, same as always"; pt denies SI or HI, denies halluncinations, st "I ain't saying that because I always get put in a room I don't want to be in; I'm just saying I'm here for mental problems"; pt reports he is a resident at [facility] group home on [address] since December and doesn't want to be there any longer; left tonight and called police to bring him to hospital for evaluation; admits to being a heavy smoker and occasional ETOH use (last drank on Saturday or Sunday); denies drug use; pt voices good understanding of behavioral evaluation rules; changes into behav scrubs (2 black coats, red hat, black shoes, black pants and socks, blue print underwear, blue print ss shirt, yellow shorts all removed and placed in labeled pt belonging bag to be secured on the nursing unit); no protocols ordered at this time per Dr York CeriseForbach

## 2014-07-30 NOTE — ED Notes (Signed)
Discharged to lobby, sitting by officer in wheelchair. Awaits transport from board and care. Patient aware will be about noon.

## 2014-07-30 NOTE — ED Notes (Signed)
Attempted to call group [phone] to inform them pt ready for d/c; no answer at this time

## 2014-07-30 NOTE — ED Notes (Signed)
Previous nurse reports being unable to reach board and care, pt served breakfast, alert resting in bed with HOB elevated, will attempt to reach home for discharge again

## 2014-07-31 NOTE — H&P (Signed)
PATIENT NAME:  [name], [name] [id number] OF BIRTH:  04/02/1960 OF ADMISSION:  02/01/2014 INFORMATION: A 54 year old man with a history of schizoaffective disorder and HIV who was petitioned to the hospital because of making violent threats towards people at his group home.  COMPLAINT: "I?ve been taking this class."  OF PRESENT ILLNESS: intake assessment: patient was brought into the ED by [facility] by IVC; patient states that the police stopped him on the road and he told them that he would take their gun and go back and kill the staff; however patient states that he only made the comment because he was upset and states that he wanted to get away from the group home for a few days; Per patient, "I was upset with this woman named "Newman Nip,"; she took my money and scammed me out of $200.00; I have it in my mind to get her; I'm homicidal right now.". commitment paperwork indicates that he had made threats stating that he was going to kill people at his group home and kill [place]. He tells the story that he was angry yesterday because Marletta Lor, his girlfriend, got him to give her money and now he realizes he is not going to get it back as she used all in crack.  He also was upset because the group home was not giving him any money to buy cigarettes or get anything else he needed. He admits that he made statements about killing someone and walking out in traffic and letting the cars run me over. He says he has been only partially compliant with his medicine, flushing it down the toilet every few days or so.  sleep has been poor. His mood has been down recently. He is taking a class currently at [facility] and apparently he got a grant to do that and now is worried that he will not pass the class. from ACT: patient has a long history of intermittent agitation, mainly verbal outburst.  Has failed multiple placements in [facility] as a result of disruptive behavior.  Was given a 30 d notice by [facility] last month.  He will be moving  out on Nov 28th.  No recent changes in meds have been made.   ABUSE HISTORY: He has a past history of alcohol use. He used to be heavier, but he has cut way back on it, and he only drinks 1 beer a week now. There have been times in the more distant past where he might have dabbled in drugs, but it has not been a major part of his problem and he is not using any drugs recently. about 2 packs of cigarettes per day. PSYCHIATRIC HISTORY: [facility] [place] mg twice a day2 mg twice a day500 mg in the morning and 1000 at night15 mg at night, 600 mg twice a daysustenna: 156 mg IM q 4 weeks given last on 11/6patient was hospitalized at [facility] for several years MEDICAL HISTORY:diagnosed 30 years ago: on [place] q dayD deficiency: vit d 08-Jun-1998 U q daysynthroid 0.025 mg po q dayZocor 20 mg qhsColace 100 mg po bidenlargement: Flomax 0.4 mg q day and terazosin 1 mg po q hscoreg 3.125 mg bid  HISTORY: He says he has a cousin with mental illness.  His mother dies on 2013-06-08, she had Alzheimer?s dementia.  Her father was in prison for murder and he never met him.  HISTORY: Lives at a group home.  Receives services by [facility] support. He claims that he has been given  a 30 day notice to get out of the group home and is due to leave on Nov 28th. has a daughter who is 54 y/o.  The mother is the pt?s girlfriend whom he met 27 years ago at a psychiatric hospital.  His daughter has been removed from the mother and adopted.  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 He already got an F and does not want to get another one, says he has to be d/c soon in order to complete an assignment. patient was in prison for 2 years after he stole a state car.  No known drug allergies.  OF SYSTEMS: This patient currently denies any acute suicidal or homicidal ideation. Admits that he is having hallucinations at times. Physically he is feeling a little run down. Denies nausea, vomiting or diarrhea. No  other acute physical symptoms. The rest of the review of systems is negative.   STATUS EXAMINATION: This is a 54 year old AA gentleman who looks older than his stated age.  Behavior: calm and cooperative at times he became tearful.  Eye contact: fair. Speech: at times difficult to understand as it is fast. Psychomotor activity is still a little sluggish. Mood: dysphoric. Affect: reactive.  Thoughts process: are a little scattered and disorganized. Thought content: Did not make any obviously bizarre statements. Admits yesterday that he has hallucinations. Denies acute suicidal or homicidal ideation.  The patient is alert and oriented x4. Judgment and insight limited.   PHYSICAL EXAM: **Vital Signs.:   13-Nov-15 07:03  .?Vital Signs Type: Routine  .?Temperature Temperature (F): 97.8  .?Celsius: 36.5  .?Pulse Pulse: 76  .?Respirations Respirations: 18  .?Systolic BP Systolic BP: 341  .?Diastolic BP (mmHg) Diastolic BP (mmHg): 71  .?Mean BP: 83  Musculoskeletal: gait: pt walks slowly with a limp, nl muscular tone, does have evidence of involuntary movements on upper extremity.  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   Labs:  Lab Results: Hepatic:  11-Nov-15 20:39   Bilirubin, Total 0.3  Alkaline Phosphatase 95 (46-116New Reference Range  SGPT (ALT)  10 (14-63New Reference Range  SGOT (AST) 21  Total Protein, Serum 7.1  Albumin, Serum 3.5  TDMs:  11-Nov-15 20:38   Lithium, Serum 0.73 (0.60-1.20>= 1.50 mmol/L)  General Ref:  11-Nov-15 20:39   Acetaminophen, Serum < 2 (10-30TOXIC: > 200 mcg/mL > 50 mcg/mL at 12 hr after ingestion > 300 mcg/mL at 4 hr after ingestion)  Salicylates, Serum < 1.7 (0.0-2.82.8-20.0 mg/dL>30.0 mg/dL)  Routine Chem:  11-Nov-15 20:39   Glucose, Serum 84  BUN 10  Creatinine (comp) 1.11  Sodium, Serum 142  Potassium, Serum 3.9  Chloride, Serum  108  CO2, Serum 28  Calcium (Total), Serum  8.4  Osmolality (calc) 281  eGFR (African American) >60  eGFR (Non-African American) >60  (eGFR values <19m/min/1.73 m2 may be an indication of chronicdisease (CKD).eGFR, using the MRDR Study equation, is useful in with stable renal function.eGFR calculation will not be reliable in acutely ill patientsserum creatinine is changing rapidly. It is not useful inon dialysis. The eGFR calculation may not be applicablepatients at the low and high extremes of body sizes, pregnantand vegetarians.)  Anion Gap  6  Ethanol, S. < 3 (Result(s) reported on 31 Jan 2014 at 09:14PM.)  Urine Drugs:  [id number]  Tricyclic Antidepressant, Ur Qual (comp) NEGATIVE (Result(s) reported on 31 Jan 2014 at 09:28PM.)  Amphetamines, Urine Qual. NEGATIVE  MDMA, Urine Qual. NEGATIVE  Cocaine Metabolite, Urine Qual. NEGATIVE  Opiate, Urine  qual NEGATIVE  Phencyclidine, Urine Qual. NEGATIVE  Cannabinoid, Urine Qual. NEGATIVE  Barbiturates, Urine Qual. NEGATIVE  Benzodiazepine, Urine Qual. POSITIVE (-----------------URINE DRUG SCREEN provides only a preliminary, unconfirmedtest result and should not be used for non-medical  Clinical consideration and professional judgment should be to any positive drug screen result due to possiblesubstances.  A more specific alternate chemical methodbe used in order to obtain a confirmed analytical result.  Gasspectrometry (GC/MS) is the preferredmethod.)  Methadone, Urine Qual. NEGATIVE  Routine UA:  11-Nov-15 20:25   Color (UA) Yellow  Clarity (UA) Clear  Glucose (UA) Negative  Bilirubin (UA) Negative  Ketones (UA) Negative  Specific Gravity (UA) 1.011  Blood (UA) Negative  pH (UA) 7.0  Protein (UA) Negative  Nitrite (UA) Negative  Leukocyte Esterase (UA) Negative (Result(s) reported on 31 Jan 2014 at 09:38PM.)  RBC (UA) 1 /HPF  WBC (UA) 3 /HPF  Bacteria (UA) NONE SEEN  Epithelial Cells (UA) <1 /HPF  Mucous (UA) PRESENT (Result(s) reported on 31 Jan 2014 at 09:38PM.)  Routine Hem:  11-Nov-15 20:39   WBC (CBC) 6.7  RBC (CBC)  3.71  Hemoglobin (CBC) 13.1   Hematocrit (CBC) 40.0  Platelet Count (CBC) 290 (Result(s) reported on 31 Jan 2014 at 09:07PM.)  MCV  108  MCH  35.3  MCHC 32.7  RDW 12.7   PRINCIPAL AND PRIMARY: I: Schizoaffective disorder, bipolar type. dyskinesiause disorder severe DIAGNOSES:I: No further. II: Deferred. III: HIV-positive, prostate hypertrophy, dyslipidemia, high blood pressure, hypothyroidism.  A 54 year old man with schizoaffective disorder admitted after he communicated threats to hurt other and kill himself.  Pt in need of hospitalization mainly for observation, medications adjustments and de-escalation.  PLAN: d/o:prolixin 15 mg po qhsDepakote 500 mg po q am and 1000 mg po qhslithium 600 mg po bid d/c alprazolamstart klonopin 1 mg po bid but will attempt to taper off.  ACT team does not know who started xanax as it is a new prescription. benztropine 2 mg po bid cravingcontinue nicotine inhaler continue atripla q dayD deficiency: continue vit d 2000 U q daycontinue synthroid 0.025 mg po q daycontinue Zocor 20 mg qhscontinue Colace 100 mg po bidenlargement: continue Flomax 0.4 mg q day and terazosin 1 mg po q hs continue coreg 3.125 mg bid  order Depakote level and lithium level in amorder TSH    Electronic Signatures: Hildred Priest (MD)  (Signed on 13-Nov-15 14:26)  Authored  Last Updated: 13-Nov-15 14:26 by Hildred Priest (MD)

## 2014-08-19 ENCOUNTER — Encounter: Payer: Self-pay | Admitting: Emergency Medicine

## 2014-08-19 ENCOUNTER — Emergency Department
Admission: EM | Admit: 2014-08-19 | Discharge: 2014-08-19 | Disposition: A | Discharge status: Home / Self Care | Payer: Medicare Other | Attending: Emergency Medicine | Admitting: Emergency Medicine

## 2014-08-19 DIAGNOSIS — F259 Schizoaffective disorder, unspecified: Secondary | ICD-10-CM | POA: Diagnosis not present

## 2014-08-19 DIAGNOSIS — I10 Essential (primary) hypertension: Secondary | ICD-10-CM | POA: Diagnosis not present

## 2014-08-19 DIAGNOSIS — F329 Major depressive disorder, single episode, unspecified: Secondary | ICD-10-CM | POA: Diagnosis not present

## 2014-08-19 DIAGNOSIS — Z720 Tobacco use: Secondary | ICD-10-CM | POA: Insufficient documentation

## 2014-08-19 DIAGNOSIS — F32A Depression, unspecified: Secondary | ICD-10-CM

## 2014-08-19 LAB — URINALYSIS COMPLETE WITH MICROSCOPIC (ARMC ONLY)
BILIRUBIN URINE: NEGATIVE
Bacteria, UA: NONE SEEN
Glucose, UA: NEGATIVE mg/dL
Hgb urine dipstick: NEGATIVE
KETONES UR: NEGATIVE mg/dL
Leukocytes, UA: NEGATIVE
NITRITE: NEGATIVE
Protein, ur: NEGATIVE mg/dL
SPECIFIC GRAVITY, URINE: 1.006 (ref 1.005–1.030)
pH: 7 (ref 5.0–8.0)

## 2014-08-19 LAB — COMPREHENSIVE METABOLIC PANEL
ALK PHOS: 74 U/L (ref 38–126)
ALT: 7 U/L — AB (ref 17–63)
AST: 16 U/L (ref 15–41)
Albumin: 3.7 g/dL (ref 3.5–5.0)
Anion gap: 5 (ref 5–15)
BUN: 10 mg/dL (ref 6–20)
CO2: 25 mmol/L (ref 22–32)
Calcium: 8.7 mg/dL — ABNORMAL LOW (ref 8.9–10.3)
Chloride: 111 mmol/L (ref 101–111)
Creatinine, Ser: 1.01 mg/dL (ref 0.61–1.24)
GFR calc Af Amer: >60 mL/min (ref >60)
GFR calc non Af Amer: >60 mL/min (ref >60)
Glucose, Bld: 98 mg/dL (ref 65–99)
POTASSIUM: 3.7 mmol/L (ref 3.5–5.1)
Sodium: 141 mmol/L (ref 135–145)
Total Bilirubin: 0.2 mg/dL — ABNORMAL LOW (ref 0.3–1.2)
Total Protein: 6.6 g/dL (ref 6.5–8.1)

## 2014-08-19 LAB — CBC WITH DIFFERENTIAL/PLATELET
BASOS ABS: 0 10*3/uL (ref 0–0.1)
Basophils Relative: 1 %
Eosinophils Absolute: 0.1 10*3/uL (ref 0–0.7)
Eosinophils Relative: 3 %
HCT: 37.5 % — ABNORMAL LOW (ref 40.0–52.0)
HEMOGLOBIN: 12.7 g/dL — AB (ref 13.0–18.0)
Lymphocytes Relative: 42 %
Lymphs Abs: 1.6 10*3/uL (ref 1.0–3.6)
MCH: 34.1 pg — ABNORMAL HIGH (ref 26.0–34.0)
MCHC: 33.8 g/dL (ref 32.0–36.0)
MCV: 101 fL — AB (ref 80.0–100.0)
MONO ABS: 0.3 10*3/uL (ref 0.2–1.0)
Monocytes Relative: 7 %
NEUTROS ABS: 1.9 10*3/uL (ref 1.4–6.5)
Neutrophils Relative %: 47 %
Platelets: 181 10*3/uL (ref 150–440)
RBC: 3.71 MIL/uL — ABNORMAL LOW (ref 4.40–5.90)
RDW: 13.9 % (ref 11.5–14.5)
WBC: 3.9 10*3/uL (ref 3.8–10.6)

## 2014-08-19 LAB — URINE DRUG SCREEN, QUALITATIVE (ARMC ONLY)
Amphetamines, Ur Screen: NOT DETECTED
BARBITURATES, UR SCREEN: NOT DETECTED
BENZODIAZEPINE, UR SCRN: NOT DETECTED
CANNABINOID 50 NG, UR ~~LOC~~: NOT DETECTED
Cocaine Metabolite,Ur ~~LOC~~: NOT DETECTED
MDMA (Ecstasy)Ur Screen: NOT DETECTED
METHADONE SCREEN, URINE: NOT DETECTED
OPIATE, UR SCREEN: NOT DETECTED
PHENCYCLIDINE (PCP) UR S: NOT DETECTED
Tricyclic, Ur Screen: NOT DETECTED

## 2014-08-19 LAB — ETHANOL: Alcohol, Ethyl (B): <5 mg/dL (ref <5)

## 2014-08-19 MED ORDER — THIAMINE HCL 100 MG/ML IJ SOLN: 100.0000 mg | Freq: Once | INTRAMUSCULAR | Status: DC

## 2014-08-19 MED ORDER — ADULT MULTIVITAMIN W/MINERALS CH: 1.0000 | ORAL_TABLET | Freq: Every day | ORAL | Status: DC

## 2014-08-19 MED ORDER — LOPERAMIDE HCL 2 MG PO CAPS: 2.0000 mg | ORAL_CAPSULE | ORAL | Status: DC | PRN

## 2014-08-19 MED ORDER — VITAMIN B-1 100 MG PO TABS: 100.0000 mg | ORAL_TABLET | Freq: Every day | ORAL | Status: DC

## 2014-08-19 MED ORDER — LORAZEPAM 1 MG PO TABS: 1.0000 mg | ORAL_TABLET | Freq: Every day | ORAL | Status: DC

## 2014-08-19 MED ORDER — ONDANSETRON 4 MG PO TBDP: 4.0000 mg | ORAL_TABLET | Freq: Four times a day (QID) | ORAL | Status: DC | PRN

## 2014-08-19 MED ORDER — LORAZEPAM 1 MG PO TABS: 1.0000 mg | ORAL_TABLET | Freq: Four times a day (QID) | ORAL | Status: DC

## 2014-08-19 MED ORDER — LORAZEPAM 1 MG PO TABS: 1.0000 mg | ORAL_TABLET | Freq: Three times a day (TID) | ORAL | Status: DC

## 2014-08-19 MED ORDER — LORAZEPAM 1 MG PO TABS: 1.0000 mg | ORAL_TABLET | Freq: Two times a day (BID) | ORAL | Status: DC

## 2014-08-19 MED ORDER — HYDROXYZINE HCL 25 MG PO TABS: 25.0000 mg | ORAL_TABLET | Freq: Four times a day (QID) | ORAL | Status: DC | PRN

## 2014-08-19 MED ORDER — LORAZEPAM 1 MG PO TABS: 1.0000 mg | ORAL_TABLET | Freq: Four times a day (QID) | ORAL | Status: DC | PRN

## 2014-08-19 NOTE — ED Notes (Addendum)

## 2014-08-19 NOTE — ED Notes (Signed)
Dinner delivered to pt.

## 2014-08-19 NOTE — Consult Note (Deleted)
[facility] Consult   Reason for Consult:  Follow up Referring Physician:  ER Patient Identification: [name] MRN:  [id number] Principal Diagnosis: Alcohol dependence chronic continous. Diagnosis:  There are no active problems to display for this patient.   Total Time spent with patient: 45 minutes  Subjective:   [name] is a 54 y.o. male patient admitted with a long H/O dependence and has been drinkign upto 2 pints of hard liquor per day for one yr. Last drink was 8 hrs ago CC" I need detox."."I have stomach pain."  HPI:  As above. Pt lives by himself and is single and works through [employer]  And decided to get help for alcohol dependence. HPI Elements:     Past Medical History:  Past Medical History  Diagnosis Date  . HIV (human immunodeficiency virus infection)   . BPH (benign prostatic hypertrophy)   . Dyslipidemia   . Hypertension   . Hypothyroidism   . Schizoaffective disorder    History reviewed. No pertinent past surgical history. Family History: History reviewed. No pertinent family history. Social History:  History  Alcohol Use  . Yes     History  Drug Use No    History   Social History  . Marital Status: Single    Spouse Name: N/A  . Number of Children: N/A  . Years of Education: N/A   Social History Main Topics  . Smoking status: Current Every Day Smoker -- 2.00 packs/day    Types: Cigarettes  . Smokeless tobacco: Not on file  . Alcohol Use: Yes  . Drug Use: No  . Sexual Activity: Not on file   Other Topics Concern  . None   Social History Narrative   Additional Social History:    History of alcohol / drug use?: No history of alcohol / drug abuse                     Allergies:  No Known Allergies  Labs:  Results for orders placed or performed during the hospital encounter of 08/19/14 (from the past 48 hour(s))  CBC WITH DIFFERENTIAL     Status: Abnormal   Collection Time: 08/19/14  4:09 AM   Result Value Ref Range   WBC 3.9 3.8 - 10.6 K/uL   RBC 3.71 (L) 4.40 - 5.90 MIL/uL   Hemoglobin 12.7 (L) 13.0 - 18.0 g/dL   HCT 37.5 (L) 40.0 - 52.0 %   MCV 101.0 (H) 80.0 - 100.0 fL   MCH 34.1 (H) 26.0 - 34.0 pg   MCHC 33.8 32.0 - 36.0 g/dL   RDW 13.9 11.5 - 14.5 %   Platelets 181 150 - 440 K/uL   Neutrophils Relative % 47 %   Neutro Abs 1.9 1.4 - 6.5 K/uL   Lymphocytes Relative 42 %   Lymphs Abs 1.6 1.0 - 3.6 K/uL   Monocytes Relative 7 %   Monocytes Absolute 0.3 0.2 - 1.0 K/uL   Eosinophils Relative 3 %   Eosinophils Absolute 0.1 0 - 0.7 K/uL   Basophils Relative 1 %   Basophils Absolute 0.0 0 - 0.1 K/uL  Comprehensive metabolic panel     Status: Abnormal   Collection Time: 08/19/14  4:09 AM  Result Value Ref Range   Sodium 141 135 - 145 mmol/L   Potassium 3.7 3.5 - 5.1 mmol/L   Chloride 111 101 - 111 mmol/L   CO2 25 22 - 32 mmol/L   Glucose, Bld 98 65 -  99 mg/dL   BUN 10 6 - 20 mg/dL   Creatinine, Ser 1.01 0.61 - 1.24 mg/dL   Calcium 8.7 (L) 8.9 - 10.3 mg/dL   Total Protein 6.6 6.5 - 8.1 g/dL   Albumin 3.7 3.5 - 5.0 g/dL   AST 16 15 - 41 U/L   ALT 7 (L) 17 - 63 U/L   Alkaline Phosphatase 74 38 - 126 U/L   Total Bilirubin 0.2 (L) 0.3 - 1.2 mg/dL   GFR calc non Af Amer >60 >60 mL/min   GFR calc Af Amer >60 >60 mL/min    Comment: (NOTE) The eGFR has been calculated using the CKD EPI equation. This calculation has not been validated in all clinical situations. eGFR's persistently <60 mL/min signify possible Chronic Kidney Disease.    Anion gap 5 5 - 15  Ethanol     Status: None   Collection Time: 08/19/14  4:09 AM  Result Value Ref Range   Alcohol, Ethyl (B) <5 <5 mg/dL    Comment:        LOWEST DETECTABLE LIMIT FOR SERUM ALCOHOL IS 11 mg/dL FOR MEDICAL PURPOSES ONLY   Urine Drug Screen, [facility])     Status: None   Collection Time: 08/19/14  4:10 AM  Result Value Ref Range   Tricyclic, Ur Screen NONE DETECTED NONE DETECTED   Amphetamines, Ur  Screen NONE DETECTED NONE DETECTED   MDMA (Ecstasy)Ur Screen NONE DETECTED NONE DETECTED   Cocaine Metabolite,Ur [place] NONE DETECTED NONE DETECTED   Opiate, Ur Screen NONE DETECTED NONE DETECTED   Phencyclidine (PCP) Ur S NONE DETECTED NONE DETECTED   Cannabinoid 50 Ng, Ur [place] NONE DETECTED NONE DETECTED   Barbiturates, Ur Screen NONE DETECTED NONE DETECTED   Benzodiazepine, Ur Scrn NONE DETECTED NONE DETECTED   Methadone Scn, Ur NONE DETECTED NONE DETECTED    Comment: (NOTE) 734  Tricyclics, urine               Cutoff 1000 ng/mL 200  Amphetamines, urine             Cutoff 1000 ng/mL 300  MDMA (Ecstasy), urine           Cutoff 500 ng/mL 400  Cocaine Metabolite, urine       Cutoff 300 ng/mL 500  Opiate, urine                   Cutoff 300 ng/mL 600  Phencyclidine (PCP), urine      Cutoff 25 ng/mL 700  Cannabinoid, urine              Cutoff 50 ng/mL 800  Barbiturates, urine             Cutoff 200 ng/mL 900  Benzodiazepine, urine           Cutoff 200 ng/mL 1000 Methadone, urine                Cutoff 300 ng/mL 1100 1200 The urine drug screen provides only a preliminary, unconfirmed 1300 analytical test result and should not be used for non-medical 1400 purposes. Clinical consideration and professional judgment should 1500 be applied to any positive drug screen result due to possible 1600 interfering substances. A more specific alternate chemical method 1700 must be used in order to obtain a confirmed analytical result.  1800 Gas chromato graphy / mass spectrometry (GC/MS) is the preferred 1900 confirmatory method.   Urinalysis complete, with microscopic [facility])     Status: Abnormal  Collection Time: 08/19/14  4:10 AM  Result Value Ref Range   Color, Urine STRAW (A) YELLOW   APPearance CLEAR (A) CLEAR   Glucose, UA NEGATIVE NEGATIVE mg/dL   Bilirubin Urine NEGATIVE NEGATIVE   Ketones, ur NEGATIVE NEGATIVE mg/dL   Specific Gravity, Urine 1.006 1.005 - 1.030   Hgb urine dipstick NEGATIVE  NEGATIVE   pH 7.0 5.0 - 8.0   Protein, ur NEGATIVE NEGATIVE mg/dL   Nitrite NEGATIVE NEGATIVE   Leukocytes, UA NEGATIVE NEGATIVE   RBC / HPF 0-5 0 - 5 RBC/hpf   WBC, UA 0-5 0 - 5 WBC/hpf   Bacteria, UA NONE SEEN NONE SEEN   Squamous Epithelial / LPF 0-5 (A) NONE SEEN    Vitals: Blood pressure 120/86, pulse 87, temperature 98.6 F (37 C), temperature source Oral, resp. rate 20, height 6' (1.829 m), weight 81.647 kg (180 lb), SpO2 99 %.  Risk to Self: Suicidal Ideation: No-Not Currently/Within Last 6 Months Suicidal Intent: No Is patient at risk for suicide?: No Suicidal Plan?: No Access to Means: No What has been your use of drugs/alcohol within the last 12 months?: NA How many times?: 0 Other Self Harm Risks: NA Triggers for Past Attempts:  (NA) Intentional Self Injurious Behavior:  (NA) Risk to Others: Homicidal Ideation: No Thoughts of Harm to Others: No Current Homicidal Intent: No Current Homicidal Plan: No Access to Homicidal Means: No Identified Victim: NA History of harm to others?: No Assessment of Violence: None Noted Violent Behavior Description: NA Does patient have access to weapons?: No Criminal Charges Pending?: No Does patient have a court date: No Prior Inpatient Therapy: Prior Inpatient Therapy: Yes Prior Therapy Dates: 2013 Prior Therapy Facilty/Provider(s): [facility] Reason for Treatment: SI Prior Outpatient Therapy: Prior Outpatient Therapy: Yes Prior Therapy Dates: Ongoing Prior Therapy Facilty/Provider(s): Unable to remember the name  Reason for Treatment: Medication Management  Does patient have an ACCT team?: Yes ([facility]/) Does patient have Intensive In-House Services?  : No Does patient have [facility] services? : No Does patient have P4[facility] services?: No  No current facility-administered medications for this encounter.   Current Outpatient Prescriptions  Medication Sig Dispense Refill  . aspirin 81 MG tablet Take 81 mg by mouth daily.    . benztropine  (COGENTIN) 1 MG tablet Take 1 mg by mouth at bedtime.    . carvedilol (COREG) 3.125 MG tablet Take 3.125 mg by mouth 2 (two) times daily with a meal.    . cholecalciferol (VITAMIN D) 1000 UNITS tablet Take 2,000 Units by mouth daily.    . divalproex (DEPAKOTE) 500 MG DR tablet Take 500 mg by mouth every morning.    . divalproex (DEPAKOTE) 500 MG DR tablet Take 1,000 mg by mouth at bedtime.    . docusate sodium (COLACE) 100 MG capsule Take 100 mg by mouth daily.    Marland Kitchen efavirenz-emtricitabine-tenofovir (ATRIPLA) 600-200-300 MG per tablet Take 1 tablet by mouth at bedtime.    . fluPHENAZine (PROLIXIN) 10 MG tablet Take 10 mg by mouth at bedtime.    . fluPHENAZine (PROLIXIN) 5 MG tablet Take 5 mg by mouth every morning.    Marland Kitchen levothyroxine (SYNTHROID, LEVOTHROID) 25 MCG tablet Take 25 mcg by mouth daily before breakfast.    . lithium 600 MG capsule Take 600 mg by mouth 2 (two) times daily with a meal.    . paliperidone (INVEGA SUSTENNA) 156 MG/ML SUSP injection Inject 156 mg into the muscle every 30 (thirty) days.    . simvastatin (  ZOCOR) 20 MG tablet Take 20 mg by mouth daily.    . tamsulosin (FLOMAX) 0.4 MG CAPS capsule Take 0.4 mg by mouth at bedtime.    Marland Kitchen terazosin (HYTRIN) 1 MG capsule Take 1 mg by mouth at bedtime.      Musculoskeletal: Strength & Muscle Tone: within normal limits Gait & Station: normal Patient leans: N/A  Psychiatric Specialty Exam: Physical Exam  Review of Systems  Constitutional: Negative.   HENT: Negative.   Eyes: Negative.   Respiratory: Negative.   Cardiovascular: Negative.   Gastrointestinal: Negative.   Genitourinary: Negative.   Musculoskeletal: Negative.   Skin: Negative.   Neurological: Negative.   Endo/Heme/Allergies: Negative.   Psychiatric/Behavioral: Negative.     Blood pressure 120/86, pulse 87, temperature 98.6 F (37 C), temperature source Oral, resp. rate 20, height 6' (1.829 m), weight 81.647 kg (180 lb), SpO2 99 %.Body mass index is 24.41  kg/(m^2).  General Appearance: Casual  Eye Contact::  Fair  Speech:  Clear and Coherent  Volume:  Normal  Mood:  Negative  Affect:  Appropriate  Thought Process:  Normal  Orientation:  Full (Time, Place, and Person)  Thought Content:  Negative  Suicidal Thoughts:  No  Homicidal Thoughts:  No  Memory:  NA Immediate;   Fair Recent;   Fair Remote;   Fair  Judgement:  Fair  Insight:  Fair  Psychomotor Activity:  Normal  Concentration:  Fair  Recall:  [employer] of Knowledge:Fair  Language: Fair  Akathisia:  No  Handed:  Right  AIMS (if indicated):     Assets:  Communication Skills Desire for Improvement Housing Resilience  ADL's:  Intact  Cognition: WNL  Sleep:      Medical Decision Making: Established Problem, Stable/Improving (1)  Treatment Plan Summary: Plan Will start pt on [facility] detox and recommend Treatment programs in the Community as pt decided to get help for his substance abuse.  Plan:  No evidence of imminent risk to self or others at present.   Discussed pt with ER Physician and staff and pt was explained that stomach pain is because of constipation. Disposition: as above to a treatment program.  Dewain Penning 08/19/2014 5:38 PM

## 2014-08-19 NOTE — ED Notes (Signed)

## 2014-08-19 NOTE — ED Provider Notes (Signed)
[facility] Emergency Department Provider Note  ____________________________________________  Time seen: Approximately 6:41 AM  I have reviewed the triage vital signs and the nursing notes.   HISTORY  Chief Complaint Depression    HPI Daniel MoundJaime Benitez is a 54 y.o. male who comes in by police tonight for concerns that he is "tired of his routine". He describes it his daily routine from Monday through Friday consists of nothing other than walking around, living in his group home, having to take lunch and dinner. He is not homicidal or suicidal, however he is just tired of it. He states that he feels that he needs more money and something to do at night.  He is currently under the care of the ACT team, and he says that they have promised to find him a new place to be an money to have.  He states he is compliant with his medications and does take his HIV medicines. Denies pain, fevers, chills or recent illness. He denies any attempt to injure himself or overdose. He denies any hallucinations.   Past Medical History  Diagnosis Date  . HIV (human immunodeficiency virus infection)   . BPH (benign prostatic hypertrophy)   . Dyslipidemia   . Hypertension   . Hypothyroidism   . Schizoaffective disorder     There are no active problems to display for this patient.   History reviewed. No pertinent past surgical history.  Current Outpatient Rx  Name  Route  Sig  Dispense  Refill  . aspirin 81 MG tablet   Oral   Take 81 mg by mouth daily.         . benztropine (COGENTIN) 1 MG tablet   Oral   Take 1 mg by mouth at bedtime.         . carvedilol (COREG) 3.125 MG tablet   Oral   Take 3.125 mg by mouth 2 (two) times daily with a meal.         . cholecalciferol (VITAMIN D) 1000 UNITS tablet   Oral   Take 2,000 Units by mouth daily.         . divalproex (DEPAKOTE) 500 MG DR tablet   Oral   Take 500 mg by mouth every morning.         . divalproex  (DEPAKOTE) 500 MG DR tablet   Oral   Take 1,000 mg by mouth at bedtime.         . docusate sodium (COLACE) 100 MG capsule   Oral   Take 100 mg by mouth daily.         Marland Kitchen. efavirenz-emtricitabine-tenofovir (ATRIPLA) 600-200-300 MG per tablet   Oral   Take 1 tablet by mouth at bedtime.         . fluPHENAZine (PROLIXIN) 10 MG tablet   Oral   Take 10 mg by mouth at bedtime.         . fluPHENAZine (PROLIXIN) 5 MG tablet   Oral   Take 5 mg by mouth every morning.         Marland Kitchen. levothyroxine (SYNTHROID, LEVOTHROID) 25 MCG tablet   Oral   Take 25 mcg by mouth daily before breakfast.         . lithium 600 MG capsule   Oral   Take 600 mg by mouth 2 (two) times daily with a meal.         . paliperidone (INVEGA SUSTENNA) 156 MG/ML SUSP injection   Intramuscular   Inject 156  mg into the muscle every 30 (thirty) days.         . simvastatin (ZOCOR) 20 MG tablet   Oral   Take 20 mg by mouth daily.         . tamsulosin (FLOMAX) 0.4 MG CAPS capsule   Oral   Take 0.4 mg by mouth at bedtime.         Marland Kitchen terazosin (HYTRIN) 1 MG capsule   Oral   Take 1 mg by mouth at bedtime.           Allergies Review of patient's allergies indicates no known allergies.  History reviewed. No pertinent family history.  Social History History  Substance Use Topics  . Smoking status: Current Every Day Smoker -- 2.00 packs/day    Types: Cigarettes  . Smokeless tobacco: Not on file  . Alcohol Use: Yes    Review of Systems Constitutional: No fever/chills Eyes: No visual changes. ENT: No sore throat. Cardiovascular: Denies chest pain. Respiratory: Denies shortness of breath. Gastrointestinal: No abdominal pain.  No nausea, no vomiting.  No diarrhea.  No constipation. Genitourinary: Negative for dysuria. Musculoskeletal: Negative for back pain. Skin: Negative for rash. Neurological: Negative for headaches, focal weakness or numbness. Denies homicidal or suicidal ideation.  Denies wanting to harm himself at all. States he is simply needs something else to do with his life.  He does tell me that he does have a history of being HIV positive, however he is currently on his medications.  10-point ROS otherwise negative.  ____________________________________________   PHYSICAL EXAM:  VITAL SIGNS: ED Triage Vitals  Enc Vitals Group     BP 08/19/14 0402 113/75 mmHg     Pulse Rate 08/19/14 0402 88     Resp 08/19/14 0402 20     Temp 08/19/14 0402 98.2 F (36.8 C)     Temp Source 08/19/14 0402 Oral     SpO2 08/19/14 0402 97 %     Weight 08/19/14 0402 180 lb (81.647 kg)     Height 08/19/14 0402 6' (1.829 m)     Head Cir --      Peak Flow --      Pain Score --      Pain Loc --      Pain Edu? --      Excl. in GC? --     Constitutional: Alert and oriented. Well appearing and in no acute distress. Seated comfortably in a hallway bed. Eyes: Conjunctivae are normal. PERRL. EOMI. Head: Atraumatic. Nose: No congestion/rhinnorhea. Mouth/Throat: Mucous membranes are moist.  Neck: No stridor.   Cardiovascular: Normal rate, regular rhythm. Grossly normal heart sounds.  Good peripheral circulation. Respiratory: Normal respiratory effort.  No retractions. Lungs CTAB. Gastrointestinal: Soft and nontender. No distention. No abdominal bruits. No CVA tenderness. Musculoskeletal: No lower extremity tenderness nor edema.  No joint effusions. Neurologic:  Normal speech and language. No gross focal neurologic deficits are appreciated. Speech is normal, slightly pressured at times. He is oriented well. Skin:  Skin is warm, dry and intact. No rash noted. Psychiatric: Mood and affect are slightly elevated. Speech is normal, and behavior is amicable.  ____________________________________________   LABS (all labs ordered are listed, but only abnormal results are displayed)  Labs Reviewed  CBC WITH DIFFERENTIAL/PLATELET - Abnormal; Notable for the following:    RBC 3.71 (*)     Hemoglobin 12.7 (*)    HCT 37.5 (*)    MCV 101.0 (*)    MCH 34.1 (*)  All other components within normal limits  COMPREHENSIVE METABOLIC PANEL - Abnormal; Notable for the following:    Calcium 8.7 (*)    ALT 7 (*)    Total Bilirubin 0.2 (*)    All other components within normal limits  URINALYSIS COMPLETEWITH MICROSCOPIC ([facility] ONLY) - Abnormal; Notable for the following:    Color, Urine STRAW (*)    APPearance CLEAR (*)    Squamous Epithelial / LPF 0-5 (*)    All other components within normal limits  ETHANOL  URINE DRUG SCREEN, QUALITATIVE ([facility] ONLY)   ____________________________________________  EKG   ____________________________________________  RADIOLOGY   ____________________________________________   PROCEDURES  Procedure(s) performed: None  Critical Care performed: No  ____________________________________________   INITIAL IMPRESSION / ASSESSMENT AND PLAN / ED COURSE  Pertinent labs & imaging results that were available during my care of the patient were reviewed by me and considered in my medical decision making (see chart for details).  Patient presents for evaluation of concerns regarding eating tired of his current living situation and being dissatisfied with his group home. He does not appear to be homicidal or suicidal, he does have elevated mood, but does not appear to be under the influence of any hallucinations. At this point, he is not under involuntary commitment by the police who brought him here, and I do not see reason to a similar commitment. He is voluntary and willing to stay and be evaluated by TTS and psychiatry which seems to be his primary reason for evaluation.  Patient does smoke and use alcohol, he does not appear to be under the influence tonight. He is not a heavy drinker. Denies any drug use.  His medical screening exams are normal and he appears medically clear. I ordered TTS consult and psychiatric evaluation on a voluntary  basis. He does have a group home where he lives, and he denies any thoughts of harming himself or others. ____________________________________________   FINAL CLINICAL IMPRESSION(S) / ED DIAGNOSES  Medical screening exam, depression without suicidal ideation or psychotic features, Initial, acute,   Sharyn Creamer, MD 08/19/14 [phone]

## 2014-08-19 NOTE — Progress Notes (Signed)
CSW met with patient for continued assessment.  Patient states came to [facility] as he wanted to get some decent food.  He is frustrated and pressed at his group home, does not like the food, wants more money so he can purchase cigarettes.  " I want to go to [facility]".   States he does not want to be at the group home so he came to the hospital.  "this is the only avenue I know".  Call to [facility] spoke to Freda Munro, [phone], they are currently working on locating new housing for patient as he has expressed that he wants a new group home.  However unable to locate one at this time.  Call to group home  [facility], for collateral, spoke to care taker Joseph Art [phone].  She will come pick patient up when he is ready for discharge.  Waiting for recommendation from Psych MD for disposition recommendation.  Casimer Lanius. Latanya Presser, MSW Clinical Social Work Department Emergency Room [phone] 11:30 AM

## 2014-08-19 NOTE — ED Notes (Signed)
Pt presents to ER via [employer], pt is voluntary. Pt has a letter stating he is depressed and doesn't want to live in group home.

## 2014-08-19 NOTE — ED Notes (Signed)
BEHAVIORAL HEALTH ROUNDING Patient sleeping: No. Patient alert and oriented: yes Behavior appropriate: Yes.  ; If no, describe:   Nutrition and fluids offered: Yes  Toileting and hygiene offered: Yes  Sitter present: no Law enforcement present: Yes  and ODS  

## 2014-08-19 NOTE — ED Notes (Signed)
ED BHU PLACEMENT JUSTIFICATION Is the patient under IVC or is there intent for IVC: No. Is the patient medically cleared: Yes.   Is there vacancy in the ED BHU: No. Is the population mix appropriate for patient: Yes.   Is the patient awaiting placement in inpatient or outpatient setting: Yes.   Has the patient had a psychiatric consult: No. Survey of unit performed for contraband, proper placement and condition of furniture, tampering with fixtures in bathroom, shower, and each patient room: Yes.  ; Findings:  APPEARANCE/BEHAVIOR calm, cooperative and adequate rapport can be established NEURO ASSESSMENT Orientation: time, place and person Hallucinations: No.None noted (Hallucinations) Speech: Normal Gait: normal RESPIRATORY ASSESSMENT Normal expansion.  Clear to auscultation.  No rales, rhonchi, or wheezing. CARDIOVASCULAR ASSESSMENT regular rate and rhythm, S1, S2 normal, no murmur, click, rub or gallop GASTROINTESTINAL ASSESSMENT soft, nontender, BS WNL, no r/g EXTREMITIES normal strength, tone, and muscle mass PLAN OF CARE Provide calm/safe environment. Vital signs assessed twice daily. ED BHU Assessment once each 12-hour shift. Collaborate with intake RN daily or as condition indicates. Assure the ED provider has rounded once each shift. Provide and encourage hygiene. Provide redirection as needed. Assess for escalating behavior; address immediately and inform ED provider.  Assess family dynamic and appropriateness for visitation as needed: Yes.  ; If necessary, describe findings:  Educate the patient/family about BHU procedures/visitation: Yes.  ; If necessary, describe findings:

## 2014-08-19 NOTE — ED Notes (Signed)
BEHAVIORAL HEALTH ROUNDING Patient sleeping: Yes.   Patient alert and oriented: yes Behavior appropriate: Yes.  ; If no, describe:  Nutrition and fluids offered: Yes  Toileting and hygiene offered: Yes  Sitter present: no Law enforcement present: Yes  

## 2014-08-19 NOTE — Discharge Instructions (Signed)
Schizoaffective Disorder Schizoaffective disorder (ScAD) is a mental illness. It causes symptoms that are a mixture of schizophrenia (a psychotic disorder) and an affective (mood) disorder. The schizophrenic symptoms may include delusions, hallucinations, or odd behavior. The mood symptoms may be similar to major depression or bipolar disorder. ScAD may interfere with personal relationships or normal daily activities. People with ScAD are at increased risk for job loss, social isolation,physical health problems, anxiety and substance use disorders, and suicide. ScAD usually occurs in cycles. Periods of severe symptoms are followed by periods of less severe symptoms or improvement. The illness affects men and women equally but usually appears at an earlier age (teenage or early adult years) in men. People who have family members with schizophrenia, bipolar disorder, or ScAD are at higher risk of developing ScAD. SYMPTOMS  At any one time, people with ScAD may have psychotic symptoms only or both psychotic and mood symptoms. The psychotic symptoms include one or more of the following:  Hearing, seeing, or feeling things that are not there (hallucinations).   Having fixed, false beliefs (delusions). The delusions usually are of being attacked, harassed, cheated, persecuted, or conspired against (paranoid delusions).  Speaking in a way that makes no sense to others (disorganized speech). The psychotic symptoms of ScAD may also include confusing or odd behavior or any of the negative symptoms of schizophrenia. These include loss of motivation for normal daily activities, such as bathing or grooming, withdrawal from other people, and lack of emotions.  The mood symptoms of ScAD occur more often than not. They resemble major depressive disorder or bipolar mania. Symptoms of major depression include depressed mood and four or more of the following:  Loss of interest in usually pleasurable activities  (anhedonia).  Sleeping more or less than normal.  Feeling worthless or excessively guilty.  Lack of energy or motivation.  Trouble concentrating.  Eating more or less than usual.  Thinking a lot about death or suicide. Symptoms of bipolar mania include abnormally elevated or irritable mood and increased energy or activity, plus three or more of the following:   More confidence than normal or feeling that you are able to do anything (grandiosity).  Feeling rested with less sleep than normal.   Being easily distracted.   Talking more than usual or feeling pressured to keep talking.   Feeling that your thoughts are racing.  Engaging in high-risk activities such as buying sprees or foolish business decisions. DIAGNOSIS  ScAD is diagnosed through an assessment by your health care provider. Your health care provider will observe and ask questions about your thoughts, behavior, mood, and ability to function in daily life. Your health care provider may also ask questions about your medical history and use of drugs, including prescription medicines. Your health care provider may also order blood tests and imaging exams. Certain medical conditions and substances can cause symptoms that resemble ScAD. Your health care provider may refer you to a mental health specialist for evaluation.  ScAD is divided into two types. The depressive type is diagnosed if your mood symptoms are limited to major depression. The bipolar type is diagnosed if your mood symptoms are manic or a mixture of manic and depressive symptoms TREATMENT  ScAD is usually a lifelong illness. Long-term treatment is necessary. The following treatments are available:  Medicine. Different types of medicine are used to treat ScAD. The exact combination depends on the type and severity of your symptoms. Antipsychotic medicine is used to control psychotic symptomssuch as delusions, paranoia,   and hallucinations. Mood stabilizers can  even the highs and lows of bipolar manic mood swings. Antidepressant medicines are used to treat major depressive symptoms.  Counseling or talk therapy. Individual, group, or family counseling may be helpful in providing education, support, and guidance. Many people with ScAD also benefit from social skills and job skills (vocational) training. A combination of medicine and counseling is usually best for managing the disorder over time. A procedure in which electricity is applied to the brain through the scalp (electroconvulsive therapy) may be used to treat people with severe manic symptoms that do not respond to medicine and counseling. HOME CARE INSTRUCTIONS   Take all your medicine as prescribed.  Check with your health care provider before starting new prescription or over-the-counter medicines.  Keep all follow up appointments with your health care provider. SEEK MEDICAL CARE IF:   If you are not able to take your medicines as prescribed.  If your symptoms get worse. SEEK IMMEDIATE MEDICAL CARE IF:   You have serious thoughts about hurting yourself or others. Document Released: 07/20/2006 Document Revised: 07/24/2013 Document Reviewed: 10/21/2012 Exit[employer] Patient Information 2015 [employer]. This information is not intended to replace advice given to you by your health care provider. Make sure you discuss any questions you have with your health care provider.  

## 2014-08-19 NOTE — ED Notes (Addendum)
I placed call to [facility] about need to pick pt up from the ER due to pt being discharged. I spoke with Darl PikesSusan at group home.  Darl PikesSusan stated " Do you know how he got there? I don't have anyway to get him. Do you know how m any times he has done this, it is not just this group home? I don't have a check or anything to pay for him to be brought back. The administrators are on vacation."  I informed Darl PikesSusan that I did know that the pt came to the ER via [facility]. I also informed Darl PikesSusan that since pt is a client at the group home and he is not his own payee, that they are responsible for seeing that the pt is brought back to the group home from the ER. I also informed Darl PikesSusan that if she refuses to come and pick pt up from the ER, that is classified as desertion, and would be reported to DSS.

## 2014-08-19 NOTE — ED Provider Notes (Signed)
Patient seen in the ER by Dr. Guss [place] psychiat[facility], who has cleared the patient for outpatient follow-up to the group home. Patient is no acute distress and is stable for discharge. Final diagnosis schizoaffective disorder  Daniel FilbertJonathan E Sadie Hazelett, MD 08/19/14 [phone]

## 2014-08-19 NOTE — ED Notes (Signed)
BEHAVIORAL HEALTH ROUNDING Patient sleeping: Yes.  With blanket covering head Patient alert and oriented: not applicable Behavior appropriate: Yes.    Nutrition and fluids offered: No Toileting and hygiene offered: No Sitter present: q15 minute observations Law enforcement present: Yes [employer]

## 2014-08-19 NOTE — ED Notes (Signed)
BEHAVIORAL HEALTH ROUNDING Patient sleeping: No. Patient alert and oriented: yes Behavior appropriate: Yes.  ; If no, describe:  Nutrition and fluids offered: Yes  Toileting and hygiene offered: Yes  Sitter present: no Law enforcement present: Yes  

## 2014-08-19 NOTE — Consult Note (Signed)
[facility] Consult   Reason for Consult:  Follow up Referring Physician:  [facility] Patient Identification: [name] MRN:  [id number] Principal Diagnosis: SAD with psychosis stable Diagnosis:  There are no active problems to display for this patient.   Total Time spent with patient: 45 minutes  Subjective:   [name] is a 54 y.o. male patient admitted with a long H/O mental illness and living at Group home "[facility]" since Dec of 2015 and he likes it there.Marland Kitchen  HPI:  Pt walked to [place] and was brought here to [facility]. Cannot give details. Just smiles. HPI Elements:     Past Medical History:  Past Medical History  Diagnosis Date  . HIV (human immunodeficiency virus infection)   . BPH (benign prostatic hypertrophy)   . Dyslipidemia   . Hypertension   . Hypothyroidism   . Schizoaffective disorder    History reviewed. No pertinent past surgical history. Family History: History reviewed. No pertinent family history. Social History:  History  Alcohol Use  . Yes     History  Drug Use No    History   Social History  . Marital Status: Single    Spouse Name: N/A  . Number of Children: N/A  . Years of Education: N/A   Social History Main Topics  . Smoking status: Current Every Day Smoker -- 2.00 packs/day    Types: Cigarettes  . Smokeless tobacco: Not on file  . Alcohol Use: Yes  . Drug Use: No  . Sexual Activity: Not on file   Other Topics Concern  . None   Social History Narrative   Additional Social History:    History of alcohol / drug use?: No history of alcohol / drug abuse                     Allergies:  No Known Allergies  Labs:  Results for orders placed or performed during the hospital encounter of 08/19/14 (from the past 48 hour(s))  CBC WITH DIFFERENTIAL     Status: Abnormal   Collection Time: 08/19/14  4:09 AM  Result Value Ref Range   WBC 3.9 3.8 - 10.6 K/uL   RBC 3.71 (L) 4.40 - 5.90 MIL/uL   Hemoglobin 12.7 (L)  13.0 - 18.0 g/dL   HCT 37.5 (L) 40.0 - 52.0 %   MCV 101.0 (H) 80.0 - 100.0 fL   MCH 34.1 (H) 26.0 - 34.0 pg   MCHC 33.8 32.0 - 36.0 g/dL   RDW 13.9 11.5 - 14.5 %   Platelets 181 150 - 440 K/uL   Neutrophils Relative % 47 %   Neutro Abs 1.9 1.4 - 6.5 K/uL   Lymphocytes Relative 42 %   Lymphs Abs 1.6 1.0 - 3.6 K/uL   Monocytes Relative 7 %   Monocytes Absolute 0.3 0.2 - 1.0 K/uL   Eosinophils Relative 3 %   Eosinophils Absolute 0.1 0 - 0.7 K/uL   Basophils Relative 1 %   Basophils Absolute 0.0 0 - 0.1 K/uL  Comprehensive metabolic panel     Status: Abnormal   Collection Time: 08/19/14  4:09 AM  Result Value Ref Range   Sodium 141 135 - 145 mmol/L   Potassium 3.7 3.5 - 5.1 mmol/L   Chloride 111 101 - 111 mmol/L   CO2 25 22 - 32 mmol/L   Glucose, Bld 98 65 - 99 mg/dL   BUN 10 6 - 20 mg/dL   Creatinine, Ser 1.01 0.61 -  1.24 mg/dL   Calcium 8.7 (L) 8.9 - 10.3 mg/dL   Total Protein 6.6 6.5 - 8.1 g/dL   Albumin 3.7 3.5 - 5.0 g/dL   AST 16 15 - 41 U/L   ALT 7 (L) 17 - 63 U/L   Alkaline Phosphatase 74 38 - 126 U/L   Total Bilirubin 0.2 (L) 0.3 - 1.2 mg/dL   GFR calc non Af Amer >60 >60 mL/min   GFR calc Af Amer >60 >60 mL/min    Comment: (NOTE) The eGFR has been calculated using the CKD EPI equation. This calculation has not been validated in all clinical situations. eGFR's persistently <60 mL/min signify possible Chronic Kidney Disease.    Anion gap 5 5 - 15  Ethanol     Status: None   Collection Time: 08/19/14  4:09 AM  Result Value Ref Range   Alcohol, Ethyl (B) <5 <5 mg/dL    Comment:        LOWEST DETECTABLE LIMIT FOR SERUM ALCOHOL IS 11 mg/dL FOR MEDICAL PURPOSES ONLY   Urine Drug Screen, Qualitative [facility])     Status: None   Collection Time: 08/19/14  4:10 AM  Result Value Ref Range   Tricyclic, Ur Screen NONE DETECTED NONE DETECTED   Amphetamines, Ur Screen NONE DETECTED NONE DETECTED   MDMA (Ecstasy)Ur Screen NONE DETECTED NONE DETECTED   Cocaine Metabolite,Ur  [place] NONE DETECTED NONE DETECTED   Opiate, Ur Screen NONE DETECTED NONE DETECTED   Phencyclidine (PCP) Ur S NONE DETECTED NONE DETECTED   Cannabinoid 50 Ng, Ur [place] NONE DETECTED NONE DETECTED   Barbiturates, Ur Screen NONE DETECTED NONE DETECTED   Benzodiazepine, Ur Scrn NONE DETECTED NONE DETECTED   Methadone Scn, Ur NONE DETECTED NONE DETECTED    Comment: (NOTE) 979  Tricyclics, urine               Cutoff 1000 ng/mL 200  Amphetamines, urine             Cutoff 1000 ng/mL 300  MDMA (Ecstasy), urine           Cutoff 500 ng/mL 400  Cocaine Metabolite, urine       Cutoff 300 ng/mL 500  Opiate, urine                   Cutoff 300 ng/mL 600  Phencyclidine (PCP), urine      Cutoff 25 ng/mL 700  Cannabinoid, urine              Cutoff 50 ng/mL 800  Barbiturates, urine             Cutoff 200 ng/mL 900  Benzodiazepine, urine           Cutoff 200 ng/mL 1000 Methadone, urine                Cutoff 300 ng/mL 1100 1200 The urine drug screen provides only a preliminary, unconfirmed 1300 analytical test result and should not be used for non-medical 1400 purposes. Clinical consideration and professional judgment should 1500 be applied to any positive drug screen result due to possible 1600 interfering substances. A more specific alternate chemical method 1700 must be used in order to obtain a confirmed analytical result.  1800 Gas chromato graphy / mass spectrometry (GC/MS) is the preferred 1900 confirmatory method.   Urinalysis complete, with microscopic [facility])     Status: Abnormal   Collection Time: 08/19/14  4:10 AM  Result Value Ref Range   Color, Urine STRAW (  A) YELLOW   APPearance CLEAR (A) CLEAR   Glucose, UA NEGATIVE NEGATIVE mg/dL   Bilirubin Urine NEGATIVE NEGATIVE   Ketones, ur NEGATIVE NEGATIVE mg/dL   Specific Gravity, Urine 1.006 1.005 - 1.030   Hgb urine dipstick NEGATIVE NEGATIVE   pH 7.0 5.0 - 8.0   Protein, ur NEGATIVE NEGATIVE mg/dL   Nitrite NEGATIVE NEGATIVE   Leukocytes, UA  NEGATIVE NEGATIVE   RBC / HPF 0-5 0 - 5 RBC/hpf   WBC, UA 0-5 0 - 5 WBC/hpf   Bacteria, UA NONE SEEN NONE SEEN   Squamous Epithelial / LPF 0-5 (A) NONE SEEN    Vitals: Blood pressure 120/86, pulse 87, temperature 98.6 F (37 C), temperature source Oral, resp. rate 20, height 6' (1.829 m), weight 81.647 kg (180 lb), SpO2 99 %.  Risk to Self: Suicidal Ideation: No-Not Currently/Within Last 6 Months Suicidal Intent: No Is patient at risk for suicide?: No Suicidal Plan?: No Access to Means: No What has been your use of drugs/alcohol within the last 12 months?: NA How many times?: 0 Other Self Harm Risks: NA Triggers for Past Attempts:  (NA) Intentional Self Injurious Behavior:  (NA) Risk to Others: Homicidal Ideation: No Thoughts of Harm to Others: No Current Homicidal Intent: No Current Homicidal Plan: No Access to Homicidal Means: No Identified Victim: NA History of harm to others?: No Assessment of Violence: None Noted Violent Behavior Description: NA Does patient have access to weapons?: No Criminal Charges Pending?: No Does patient have a court date: No Prior Inpatient Therapy: Prior Inpatient Therapy: Yes Prior Therapy Dates: 2013 Prior Therapy Facilty/Provider(s): [facility] Reason for Treatment: SI Prior Outpatient Therapy: Prior Outpatient Therapy: Yes Prior Therapy Dates: Ongoing Prior Therapy Facilty/Provider(s): Unable to remember the name  Reason for Treatment: Medication Management  Does patient have an ACCT team?: Yes (PSI/) Does patient have Intensive In-House Services?  : No Does patient have [facility] services? : No Does patient have P4CC services?: No  Current Facility-Administered Medications  Medication Dose Route Frequency Provider Last Rate Last Dose  . hydrOXYzine (ATARAX/VISTARIL) tablet 25 mg  25 mg Oral Q6H PRN Dewain Penning, MD      . loperamide (IMODIUM) capsule 2-4 mg  2-4 mg Oral PRN Dewain Penning, MD      . LORazepam (ATIVAN) tablet 1 mg  1 mg  Oral Q6H PRN Dewain Penning, MD      . LORazepam (ATIVAN) tablet 1 mg  1 mg Oral QID Dewain Penning, MD       Followed by  . [START ON 08/20/2014] LORazepam (ATIVAN) tablet 1 mg  1 mg Oral TID Dewain Penning, MD       Followed by  . [START ON 08/21/2014] LORazepam (ATIVAN) tablet 1 mg  1 mg Oral BID Dewain Penning, MD       Followed by  . [START ON 08/23/2014] LORazepam (ATIVAN) tablet 1 mg  1 mg Oral Daily Dewain Penning, MD      . multivitamin with minerals tablet 1 tablet  1 tablet Oral Daily Dewain Penning, MD      . ondansetron (ZOFRAN-ODT) disintegrating tablet 4 mg  4 mg Oral Q6H PRN Dewain Penning, MD      . thiamine (B-1) injection 100 mg  100 mg Intramuscular Once Dewain Penning, MD      . Derrill Memo ON 08/20/2014] thiamine (VITAMIN B-1) tablet 100 mg  100 mg Oral Daily Dewain Penning, MD  Current Outpatient Prescriptions  Medication Sig Dispense Refill  . aspirin 81 MG tablet Take 81 mg by mouth daily.    . benztropine (COGENTIN) 1 MG tablet Take 1 mg by mouth at bedtime.    . carvedilol (COREG) 3.125 MG tablet Take 3.125 mg by mouth 2 (two) times daily with a meal.    . cholecalciferol (VITAMIN D) 1000 UNITS tablet Take 2,000 Units by mouth daily.    . divalproex (DEPAKOTE) 500 MG DR tablet Take 500 mg by mouth every morning.    . divalproex (DEPAKOTE) 500 MG DR tablet Take 1,000 mg by mouth at bedtime.    . docusate sodium (COLACE) 100 MG capsule Take 100 mg by mouth daily.    Marland Kitchen efavirenz-emtricitabine-tenofovir (ATRIPLA) 600-200-300 MG per tablet Take 1 tablet by mouth at bedtime.    . fluPHENAZine (PROLIXIN) 10 MG tablet Take 10 mg by mouth at bedtime.    . fluPHENAZine (PROLIXIN) 5 MG tablet Take 5 mg by mouth every morning.    Marland Kitchen levothyroxine (SYNTHROID, LEVOTHROID) 25 MCG tablet Take 25 mcg by mouth daily before breakfast.    . lithium 600 MG capsule Take 600 mg by mouth 2 (two) times daily with a meal.    . paliperidone (INVEGA SUSTENNA) 156 MG/ML SUSP injection Inject 156  mg into the muscle every 30 (thirty) days.    . simvastatin (ZOCOR) 20 MG tablet Take 20 mg by mouth daily.    . tamsulosin (FLOMAX) 0.4 MG CAPS capsule Take 0.4 mg by mouth at bedtime.    Marland Kitchen terazosin (HYTRIN) 1 MG capsule Take 1 mg by mouth at bedtime.      Musculoskeletal: Strength & Muscle Tone: within normal limits Gait & Station: normal Patient leans: N/A  Psychiatric Specialty Exam: Physical Exam  Review of Systems  Constitutional: Negative.   HENT: Negative.   Eyes: Negative.   Respiratory: Negative.   Cardiovascular: Negative.   Gastrointestinal: Negative.   Genitourinary: Negative.   Musculoskeletal: Negative.   Skin: Negative.   Neurological: Negative.   Endo/Heme/Allergies: Negative.   Psychiatric/Behavioral: Negative.     Blood pressure 120/86, pulse 87, temperature 98.6 F (37 C), temperature source Oral, resp. rate 20, height 6' (1.829 m), weight 81.647 kg (180 lb), SpO2 99 %.Body mass index is 24.41 kg/(m^2).  General Appearance: Casual  Eye Contact::  Fair  Speech:  Slow  Volume:  Normal  Mood:  Negative  Affect:  Appropriate  Thought Process:  Circumstantial  Orientation:  Full (Time, Place, and Person)with help.  Thought Content:  Negative  Suicidal Thoughts:  No  Homicidal Thoughts:  No  Memory:  NA  Judgement:  Fair  Insight:  Fair  Psychomotor Activity:  Normal  Concentration:  Fair  Recall:  Saline of Knowledge:Poor  Language: Fair  Akathisia:  No  Handed:  Right  AIMS (if indicated):     Assets:  Communication Skills Housing Social Support Talents/Skills  ADL's:  Intact  Cognition: WNL with help  Sleep:      Medical Decision Making: Established Problem, Stable/Improving (1)  Treatment Plan Summary: Plan Discharge pt back to Group home as he wants to go back and has enough meds at home.  Plan:  No evidence of imminent risk to self or others at present.   Disposition: as above.  Dewain Penning 08/19/2014 5:55 PM

## 2014-08-19 NOTE — BH Assessment (Addendum)
Assessment Note  [name] is an 54 y.o. male [place] male that reports depression associated with his Group Home giving him a 30 day notice.  Patient denies SI/HI/Psychosis and substance abuse.  Patient was not able to tell me why the Group Home gave him a 30 day notice.  Patient reports that he does not know what he is going to do because all of his money is spent on medication.   Patient reports a prior history of psychiatric hospitalizations at [facility] 3 years ago.  Patient report compliance with medication management.  Patient reports receiving ACTT services through [facility].   Axis I: Major Depression, Recurrent severe Axis II: Deferred Axis III:  Past Medical History  Diagnosis Date  . HIV (human immunodeficiency virus infection)   . BPH (benign prostatic hypertrophy)   . Dyslipidemia   . Hypertension   . Hypothyroidism   . Schizoaffective disorder    Axis IV: economic problems, housing problems, occupational problems, other psychosocial or environmental problems, problems related to social environment, problems with access to health care services and problems with primary support group Axis V: 31-40 impairment in reality testing  Past Medical History:  Past Medical History  Diagnosis Date  . HIV (human immunodeficiency virus infection)   . BPH (benign prostatic hypertrophy)   . Dyslipidemia   . Hypertension   . Hypothyroidism   . Schizoaffective disorder     History reviewed. No pertinent past surgical history.  Family History: History reviewed. No pertinent family history.  Social History:  reports that he has been smoking Cigarettes.  He has been smoking about 2.00 packs per day. He does not have any smokeless tobacco history on file. He reports that he drinks alcohol. He reports that he does not use illicit drugs.  Additional Social History:  Alcohol / Drug Use History of alcohol / drug use?: No history of alcohol / drug abuse  CIWA: CIWA-Ar BP: 113/75  mmHg Pulse Rate: 88 COWS:    Allergies: No Known Allergies  Home Medications:  (Not in a hospital admission)  OB/GYN Status:  No LMP for male patient.  General Assessment Data Location of Assessment: [facility] ED TTS Assessment: In system Is this a Tele or Face-to-Face Assessment?: Face-to-Face Is this an Initial Assessment or a Re-assessment for this encounter?: Initial Assessment Marital status: Single Maiden name: na Is patient pregnant?: No Pregnancy Status: No Living Arrangements: Other (Comment) ([facility]) Can pt return to current living arrangement?: No Admission Status: Voluntary Is patient capable of signing voluntary admission?: Yes Referral Source: Self/Family/Friend Insurance type: Medicare   Medical Screening Exam [facility] Walk-in ONLY) Medical Exam completed: Yes  Crisis Care Plan Living Arrangements: Other (Comment) ([facility]) Name of Psychiatrist: [facility] Name of Therapist: None Reported  Education Status Is patient currently in school?: No Current Grade: NA Highest grade of school patient has completed: NA Name of school: NA Contact person: NA  Risk to self with the past 6 months Suicidal Ideation: No-Not Currently/Within Last 6 Months Has patient been a risk to self within the past 6 months prior to admission? : No Suicidal Intent: No Has patient had any suicidal intent within the past 6 months prior to admission? : No Is patient at risk for suicide?: No Suicidal Plan?: No Has patient had any suicidal plan within the past 6 months prior to admission? : No Access to Means: No What has been your use of drugs/alcohol within the last 12  months?: NA Previous Attempts/Gestures: No How many times?: 0 Other Self Harm Risks: NA Triggers for Past Attempts:  (NA) Intentional Self Injurious Behavior:  (NA) Family Suicide History: No Recent stressful life event(s): Other (Comment), Financial Problems (Positive for  HIV homeless.) Persecutory voices/beliefs?: No Depression: Yes Depression Symptoms: Despondent, Fatigue, Guilt, Feeling worthless/self pity, Loss of interest in usual pleasures, Feeling angry/irritable Substance abuse history and/or treatment for substance abuse?: No Suicide prevention information given to non-admitted patients: Not applicable  Risk to Others within the past 6 months Homicidal Ideation: No Does patient have any lifetime risk of violence toward others beyond the six months prior to admission? : No Thoughts of Harm to Others: No Current Homicidal Intent: No Current Homicidal Plan: No Access to Homicidal Means: No Identified Victim: NA History of harm to others?: No Assessment of Violence: None Noted Violent Behavior Description: NA Does patient have access to weapons?: No Criminal Charges Pending?: No Does patient have a court date: No Is patient on probation?: No  Psychosis Hallucinations: None noted Delusions: None noted  Mental Status Report Appearance/Hygiene: In hospital gown Eye Contact: Fair Motor Activity: Freedom of movement Speech: Logical/coherent Level of Consciousness: Alert Mood: Anxious Affect: Anxious Anxiety Level: Minimal Thought Processes: Coherent, Relevant Judgement: Unimpaired Orientation: Person, Place, Time, Situation Obsessive Compulsive Thoughts/Behaviors: None  Cognitive Functioning Concentration: Decreased Memory: Remote Intact, Recent Intact IQ: Average Insight: Fair Impulse Control: Fair Appetite: Fair Weight Loss: 0 Weight Gain: 0 Sleep: Decreased Total Hours of Sleep: 4 Vegetative Symptoms: Staying in bed  ADLScreening [facility] Assessment Services) Patient's cognitive ability adequate to safely complete daily activities?: Yes Patient able to express need for assistance with ADLs?: No Independently performs ADLs?: Yes (appropriate for developmental age)  Prior Inpatient Therapy Prior Inpatient Therapy: Yes Prior  Therapy Dates: 2013 Prior Therapy Facilty/Provider(s): [facility] Reason for Treatment: SI  Prior Outpatient Therapy Prior Outpatient Therapy: Yes Prior Therapy Dates: Ongoing Prior Therapy Facilty/Provider(s): Unable to remember the name  Reason for Treatment: Medication Management  Does patient have an ACCT team?: Yes ([facility]/) Does patient have Intensive In-House Services?  : No Does patient have [facility] services? : No Does patient have P4CC services?: No  ADL Screening (condition at time of admission) Patient's cognitive ability adequate to safely complete daily activities?: Yes Is the patient deaf or have difficulty hearing?: No Does the patient have difficulty seeing, even when wearing glasses/contacts?: No Does the patient have difficulty concentrating, remembering, or making decisions?: No Patient able to express need for assistance with ADLs?: No Does the patient have difficulty dressing or bathing?: No Independently performs ADLs?: Yes (appropriate for developmental age) Does the patient have difficulty walking or climbing stairs?: No Weakness of Legs: None Weakness of Arms/Hands: None  Home Assistive Devices/Equipment Home Assistive Devices/Equipment: None    Abuse/Neglect Assessment (Assessment to be complete while patient is alone) Physical Abuse: Denies Verbal Abuse: Denies Sexual Abuse: Denies Exploitation of patient/patient's resources: Denies Self-Neglect: Denies Possible abuse reported to:: [place] department of social services Values / Beliefs Cultural Requests During Hospitalization: None   Advance Directives (For Healthcare) Does patient have an advance directive?: No Would patient like information on creating an advanced directive?: No - patient declined information    Additional Information 1:1 In Past 12 Months?: No CIRT Risk: No Elopement Risk: No Does patient have medical clearance?: Yes     Disposition: Pending psych disposition in the morning.   Disposition Initial Assessment Completed for this Encounter: Yes Disposition of Patient: Other dispositions Other disposition(s):  (  Pending psych disposition. )  On Site Evaluation by:   Reviewed with Physician:    [name], [name] 08/19/2014 6:34 AM

## 2014-08-19 NOTE — ED Notes (Addendum)
No answer at pt group home: [facility] ([address]) --- director Marilynne HalstedJanice Reeves, [phone]

## 2014-08-25 ENCOUNTER — Emergency Department
Admission: EM | Admit: 2014-08-25 | Discharge: 2014-08-25 | Disposition: A | Discharge status: Home / Self Care | Payer: Medicare Other | Attending: Emergency Medicine | Admitting: Emergency Medicine

## 2014-08-25 ENCOUNTER — Encounter: Payer: Self-pay | Admitting: Emergency Medicine

## 2014-08-25 DIAGNOSIS — F329 Major depressive disorder, single episode, unspecified: Secondary | ICD-10-CM | POA: Diagnosis present

## 2014-08-25 DIAGNOSIS — I10 Essential (primary) hypertension: Secondary | ICD-10-CM | POA: Insufficient documentation

## 2014-08-25 DIAGNOSIS — Z79899 Other long term (current) drug therapy: Secondary | ICD-10-CM | POA: Insufficient documentation

## 2014-08-25 DIAGNOSIS — Z7982 Long term (current) use of aspirin: Secondary | ICD-10-CM | POA: Diagnosis not present

## 2014-08-25 DIAGNOSIS — F1010 Alcohol abuse, uncomplicated: Secondary | ICD-10-CM

## 2014-08-25 DIAGNOSIS — Z720 Tobacco use: Secondary | ICD-10-CM | POA: Insufficient documentation

## 2014-08-25 LAB — URINALYSIS COMPLETE WITH MICROSCOPIC (ARMC ONLY)
Bilirubin Urine: NEGATIVE
GLUCOSE, UA: NEGATIVE mg/dL
HGB URINE DIPSTICK: NEGATIVE
Ketones, ur: NEGATIVE mg/dL
NITRITE: NEGATIVE
PROTEIN: 30 mg/dL — AB
SPECIFIC GRAVITY, URINE: 1.018 (ref 1.005–1.030)
pH: 6 (ref 5.0–8.0)

## 2014-08-25 LAB — COMPREHENSIVE METABOLIC PANEL
ALBUMIN: 3.9 g/dL (ref 3.5–5.0)
ALT: 6 U/L — ABNORMAL LOW (ref 17–63)
AST: 13 U/L — ABNORMAL LOW (ref 15–41)
Alkaline Phosphatase: 84 U/L (ref 38–126)
Anion gap: 9 (ref 5–15)
BUN: 8 mg/dL (ref 6–20)
CO2: 25 mmol/L (ref 22–32)
CREATININE: 0.97 mg/dL (ref 0.61–1.24)
Calcium: 9 mg/dL (ref 8.9–10.3)
Chloride: 106 mmol/L (ref 101–111)
GFR calc non Af Amer: >60 mL/min (ref >60)
GLUCOSE: 93 mg/dL (ref 65–99)
Potassium: 3.8 mmol/L (ref 3.5–5.1)
Sodium: 140 mmol/L (ref 135–145)
TOTAL PROTEIN: 6.7 g/dL (ref 6.5–8.1)
Total Bilirubin: 0.3 mg/dL (ref 0.3–1.2)

## 2014-08-25 LAB — CBC
HCT: 38.8 % — ABNORMAL LOW (ref 40.0–52.0)
Hemoglobin: 12.9 g/dL — ABNORMAL LOW (ref 13.0–18.0)
MCH: 33.5 pg (ref 26.0–34.0)
MCHC: 33.2 g/dL (ref 32.0–36.0)
MCV: 100.9 fL — AB (ref 80.0–100.0)
Platelets: 189 10*3/uL (ref 150–440)
RBC: 3.85 MIL/uL — AB (ref 4.40–5.90)
RDW: 14 % (ref 11.5–14.5)
WBC: 4.4 10*3/uL (ref 3.8–10.6)

## 2014-08-25 LAB — ETHANOL: ALCOHOL ETHYL (B): 7 mg/dL — AB (ref <5)

## 2014-08-25 LAB — URINE DRUG SCREEN, QUALITATIVE (ARMC ONLY)
AMPHETAMINES, UR SCREEN: NOT DETECTED
Barbiturates, Ur Screen: NOT DETECTED
Benzodiazepine, Ur Scrn: NOT DETECTED
CANNABINOID 50 NG, UR ~~LOC~~: NOT DETECTED
COCAINE METABOLITE, UR ~~LOC~~: NOT DETECTED
MDMA (ECSTASY) UR SCREEN: NOT DETECTED
Methadone Scn, Ur: NOT DETECTED
OPIATE, UR SCREEN: NOT DETECTED
PHENCYCLIDINE (PCP) UR S: NOT DETECTED
Tricyclic, Ur Screen: NOT DETECTED

## 2014-08-25 LAB — ACETAMINOPHEN LEVEL: Acetaminophen (Tylenol), Serum: <10 ug/mL — ABNORMAL LOW (ref 10–30)

## 2014-08-25 LAB — SALICYLATE LEVEL: Salicylate Lvl: <4 mg/dL (ref 2.8–30.0)

## 2014-08-25 NOTE — ED Notes (Addendum)
BEHAVIORAL HEALTH ROUNDING Patient sleeping: Yes.   Patient alert and oriented: not applicable SLEEPING Behavior appropriate: Yes.  ; If no, describe: SLEEPING Nutrition and fluids offered: No SLEEPING Toileting and hygiene offered: NoSLEEPING Sitter present: not applicable Law enforcement present: Yes ODS 

## 2014-08-25 NOTE — Discharge Instructions (Signed)
Alcohol Use Disorder °Alcohol use disorder is a mental disorder. It is not a one-time incident of heavy drinking. Alcohol use disorder is the excessive and uncontrollable use of alcohol over time that leads to problems with functioning in one or more areas of daily living. People with this disorder risk harming themselves and others when they drink to excess. Alcohol use disorder also can cause other mental disorders, such as mood and anxiety disorders, and serious physical problems. People with alcohol use disorder often misuse other drugs.  °Alcohol use disorder is common and widespread. Some people with this disorder drink alcohol to cope with or escape from negative life events. Others drink to relieve chronic pain or symptoms of mental illness. People with a family history of alcohol use disorder are at higher risk of losing control and using alcohol to excess.  °SYMPTOMS  °Signs and symptoms of alcohol use disorder may include the following:  °· Consumption of alcohol in larger amounts or over a longer period of time than intended. °· Multiple unsuccessful attempts to cut down or control alcohol use.   °· A great deal of time spent obtaining alcohol, using alcohol, or recovering from the effects of alcohol (hangover). °· A strong desire or urge to use alcohol (cravings).   °· Continued use of alcohol despite problems at work, school, or home because of alcohol use.   °· Continued use of alcohol despite problems in relationships because of alcohol use. °· Continued use of alcohol in situations when it is physically hazardous, such as driving a car. °· Continued use of alcohol despite awareness of a physical or psychological problem that is likely related to alcohol use. Physical problems related to alcohol use can involve the brain, heart, liver, stomach, and intestines. Psychological problems related to alcohol use include intoxication, depression, anxiety, psychosis, delirium, and dementia.   °· The need for  increased amounts of alcohol to achieve the same desired effect, or a decreased effect from the consumption of the same amount of alcohol (tolerance). °· Withdrawal symptoms upon reducing or stopping alcohol use, or alcohol use to reduce or avoid withdrawal symptoms. Withdrawal symptoms include: °¨ Racing heart. °¨ Hand tremor. °¨ Difficulty sleeping. °¨ Nausea. °¨ Vomiting. °¨ Hallucinations. °¨ Restlessness. °¨ Seizures. °DIAGNOSIS °Alcohol use disorder is diagnosed through an assessment by your health care provider. Your health care provider may start by asking three or four questions to screen for excessive or problematic alcohol use. To confirm a diagnosis of alcohol use disorder, at least two symptoms must be present within a 12-month period. The severity of alcohol use disorder depends on the number of symptoms: °· Mild--two or three. °· Moderate--four or five. °· Severe--six or more. °Your health care provider may perform a physical exam or use results from lab tests to see if you have physical problems resulting from alcohol use. Your health care provider may refer you to a mental health professional for evaluation. °TREATMENT  °Some people with alcohol use disorder are able to reduce their alcohol use to low-risk levels. Some people with alcohol use disorder need to quit drinking alcohol. When necessary, mental health professionals with specialized training in substance use treatment can help. Your health care provider can help you decide how severe your alcohol use disorder is and what type of treatment you need. The following forms of treatment are available:  °· Detoxification. Detoxification involves the use of prescription medicines to prevent alcohol withdrawal symptoms in the first week after quitting. This is important for people with a history of symptoms   of withdrawal and for heavy drinkers who are likely to have withdrawal symptoms. Alcohol withdrawal can be dangerous and, in severe cases, cause  death. Detoxification is usually provided in a hospital or in-patient substance use treatment facility.  Counseling or talk therapy. Talk therapy is provided by substance use treatment counselors. It addresses the reasons people use alcohol and ways to keep them from drinking again. The goals of talk therapy are to help people with alcohol use disorder find healthy activities and ways to cope with life stress, to identify and avoid triggers for alcohol use, and to handle cravings, which can cause relapse.  Medicines.Different medicines can help treat alcohol use disorder through the following actions:  Decrease alcohol cravings.  Decrease the positive reward response felt from alcohol use.  Produce an uncomfortable physical reaction when alcohol is used (aversion therapy).  Support groups. Support groups are run by people who have quit drinking. They provide emotional support, advice, and guidance. These forms of treatment are often combined. Some people with alcohol use disorder benefit from intensive combination treatment provided by specialized substance use treatment centers. Both inpatient and outpatient treatment programs are available. Document Released: 04/16/2004 Document Revised: 07/24/2013 Document Reviewed: 06/16/2012 [facility] Patient Information 2015 [place]. This information is not intended to replace advice given to you by your health care provider. Make sure you discuss any questions you have with your health care provider.  Please follow-up with your primary care doctor and social worker to arrange transfer to another group home if you aren't happy with your current living situation.

## 2014-08-25 NOTE — ED Notes (Signed)

## 2014-08-25 NOTE — ED Notes (Signed)
Pt reports he lives at "[facility]". Pt reports he did not get his allowance this month from the group home and he got upset and left the group home. Pt reports when he was picked up by the police and returned to the group home the group home placed him on 30 day eviction notice. Pt states this has made him more depressed. Pt calm and cooperative with this RN at this time.

## 2014-08-25 NOTE — ED Notes (Signed)
Patient states that he is feeling depressed and having suicidal thoughts.

## 2014-08-25 NOTE — ED Notes (Signed)
BEHAVIORAL HEALTH ROUNDING  Patient sleeping: No.  Patient alert and oriented: yes  Behavior appropriate: Yes. ; If no, describe:  Nutrition and fluids offered: Yes  Toileting and hygiene offered: Yes  Sitter present: not applicable  Law enforcement present: Yes ODS  

## 2014-08-25 NOTE — ED Notes (Signed)
[facility] informed  Of pt being discharged and will send officer to take pt back to group home. Pt awakened and given clothes to dress.

## 2014-08-25 NOTE — ED Provider Notes (Signed)
[facility] Emergency Department Provider Note  ____________________________________________  Time seen: 4:05 AM  I have reviewed the triage vital signs and the nursing notes.   HISTORY  Chief Complaint Depression    HPI [name] is a 54 y.o. male who initially presents presented to the ED complaining of depression and suicidal ideation. However, on my assessment, the patient is smiling and laughing. When I ask why he's come to the ED tonight, he says that is the same as always. He wants his money from the group home, and he is trying to manipulate his way into another group home because he doesn't like his current one. He denies any suicidal ideation or plan, homicidal ideation, auditory or visual hallucinations. He does report drinking one beer earlier today. No other symptoms such as chest pain shortness of breath fever chills nausea vomiting abdominal pain back pain headache syncope or other acute complaints.     Past Medical History  Diagnosis Date  . HIV (human immunodeficiency virus infection)   . BPH (benign prostatic hypertrophy)   . Dyslipidemia   . Hypertension   . Hypothyroidism   . Schizoaffective disorder     There are no active problems to display for this patient.   No past surgical history on file.  Current Outpatient Rx  Name  Route  Sig  Dispense  Refill  . aspirin 81 MG tablet   Oral   Take 81 mg by mouth daily.         . benztropine (COGENTIN) 1 MG tablet   Oral   Take 1 mg by mouth at bedtime.         . carvedilol (COREG) 3.125 MG tablet   Oral   Take 3.125 mg by mouth 2 (two) times daily with a meal.         . cholecalciferol (VITAMIN D) 1000 UNITS tablet   Oral   Take 2,000 Units by mouth daily.         . divalproex (DEPAKOTE) 500 MG DR tablet   Oral   Take 500 mg by mouth every morning.         . divalproex (DEPAKOTE) 500 MG DR tablet   Oral   Take 1,000 mg by mouth at bedtime.         .  docusate sodium (COLACE) 100 MG capsule   Oral   Take 100 mg by mouth daily.         Marland Kitchen. efavirenz-emtricitabine-tenofovir (ATRIPLA) 600-200-300 MG per tablet   Oral   Take 1 tablet by mouth at bedtime.         . fluPHENAZine (PROLIXIN) 10 MG tablet   Oral   Take 10 mg by mouth at bedtime.         . fluPHENAZine (PROLIXIN) 5 MG tablet   Oral   Take 5 mg by mouth every morning.         Marland Kitchen. levothyroxine (SYNTHROID, LEVOTHROID) 25 MCG tablet   Oral   Take 25 mcg by mouth daily before breakfast.         . lithium 600 MG capsule   Oral   Take 600 mg by mouth 2 (two) times daily with a meal.         . paliperidone (INVEGA SUSTENNA) 156 MG/ML SUSP injection   Intramuscular   Inject 156 mg into the muscle every 30 (thirty) days.         . simvastatin (ZOCOR) 20 MG tablet  Oral   Take 20 mg by mouth daily.         . tamsulosin (FLOMAX) 0.4 MG CAPS capsule   Oral   Take 0.4 mg by mouth at bedtime.         Marland Kitchen terazosin (HYTRIN) 1 MG capsule   Oral   Take 1 mg by mouth at bedtime.           Allergies Review of patient's allergies indicates no known allergies.  History reviewed. No pertinent family history.  Social History History  Substance Use Topics  . Smoking status: Current Every Day Smoker -- 2.00 packs/day    Types: Cigarettes  . Smokeless tobacco: Not on file  . Alcohol Use: Yes    Review of Systems  Constitutional: No fever or chills. No weight changes Eyes:No blurry vision or double vision.  ENT: No sore throat. Cardiovascular: No chest pain. Respiratory: No dyspnea or cough. Gastrointestinal: Negative for abdominal pain, vomiting and diarrhea.  No BRBPR or melena. Genitourinary: Negative for dysuria, urinary retention, bloody urine, or difficulty urinating. Musculoskeletal: Negative for back pain. No joint swelling or pain. Skin: Negative for rash. Neurological: Negative for headaches, focal weakness or numbness. Psychiatric:No  anxiety or depression.   Endocrine:No hot/cold intolerance, changes in energy, or sleep difficulty.  10-point ROS otherwise negative.  ____________________________________________   PHYSICAL EXAM:  VITAL SIGNS: ED Triage Vitals  Enc Vitals Group     BP 08/25/14 0341 111/80 mmHg     Pulse Rate 08/25/14 0341 77     Resp 08/25/14 0341 18     Temp 08/25/14 0341 98.1 F (36.7 C)     Temp Source 08/25/14 0341 Oral     SpO2 08/25/14 0341 97 %     Weight --      Height --      Head Cir --      Peak Flow --      Pain Score 08/25/14 0338 10     Pain Loc --      Pain Edu? --      Excl. in GC? --      Constitutional: Alert and oriented. Well appearing and in no distress. Eyes: No scleral icterus. No conjunctival pallor. PERRL. EOMI ENT   Head: Normocephalic and atraumatic.   Nose: No congestion/rhinnorhea. No septal hematoma   Mouth/Throat: MMM, no pharyngeal erythema. No peritonsillar mass. No uvula shift.   Neck: No stridor. No SubQ emphysema. No meningismus. Hematological/Lymphatic/Immunilogical: No cervical lymphadenopathy. Cardiovascular: RRR. Normal and symmetric distal pulses are present in all extremities. No murmurs, rubs, or gallops. Respiratory: Normal respiratory effort without tachypnea nor retractions. Breath sounds are clear and equal bilaterally. No wheezes/rales/rhonchi. Gastrointestinal: Soft and nontender. No distention. There is no CVA tenderness.  No rebound, rigidity, or guarding. Genitourinary: deferred Musculoskeletal: Nontender with normal range of motion in all extremities. No joint effusions.  No lower extremity tenderness.  No edema. Neurologic:   Normal speech and language.  CN 2-10 normal. Motor grossly intact. No pronator drift.  Normal gait. No gross focal neurologic deficits are appreciated.  Skin:  Skin is warm, dry and intact. No rash noted.  No petechiae, purpura, or bullae. Psychiatric: Mood and affect are normal. Speech and  behavior are normal. Poor insight.  ____________________________________________    LABS (pertinent positives/negatives) (all labs ordered are listed, but only abnormal results are displayed) Labs Reviewed  ACETAMINOPHEN LEVEL - Abnormal; Notable for the following:    Acetaminophen (Tylenol), Serum <10 (*)  All other components within normal limits  CBC - Abnormal; Notable for the following:    RBC 3.85 (*)    Hemoglobin 12.9 (*)    HCT 38.8 (*)    MCV 100.9 (*)    All other components within normal limits  COMPREHENSIVE METABOLIC PANEL - Abnormal; Notable for the following:    AST 13 (*)    ALT 6 (*)    All other components within normal limits  ETHANOL - Abnormal; Notable for the following:    Alcohol, Ethyl (B) 7 (*)    All other components within normal limits  URINALYSIS COMPLETEWITH MICROSCOPIC ([facility] ONLY) - Abnormal; Notable for the following:    Color, Urine YELLOW (*)    APPearance CLEAR (*)    Protein, ur 30 (*)    Leukocytes, UA TRACE (*)    Bacteria, UA RARE (*)    Squamous Epithelial / LPF 0-5 (*)    All other components within normal limits  SALICYLATE LEVEL  URINE DRUG SCREEN, QUALITATIVE ([facility] ONLY)   ____________________________________________   EKG    ____________________________________________    RADIOLOGY    ____________________________________________   PROCEDURES  ____________________________________________   INITIAL IMPRESSION / ASSESSMENT AND PLAN / ED COURSE  Pertinent labs & imaging results that were available during my care of the patient were reviewed by me and considered in my medical decision making (see chart for details).  Patient does not appear to be a safety risk at this time he is not committable and in fact he is clearly medically and psychiatrically stable for discharge. We will send him back to the group home to continue his home medications. I encouraged him to follow up with his primary care doctor and social  worker if he is unhappy with his current living situation.  ____________________________________________   FINAL CLINICAL IMPRESSION(S) / ED DIAGNOSES  Final diagnoses:  Alcohol abuse      Sharman Cheek, MD 08/25/14 [id number]

## 2014-09-10 ENCOUNTER — Emergency Department
Admission: EM | Admit: 2014-09-10 | Discharge: 2014-09-11 | Disposition: A | Discharge status: Home / Self Care | Payer: Medicare Other | Attending: Emergency Medicine | Admitting: Emergency Medicine

## 2014-09-10 ENCOUNTER — Encounter: Payer: Self-pay | Admitting: Urgent Care

## 2014-09-10 DIAGNOSIS — Z0389 Encounter for observation for other suspected diseases and conditions ruled out: Secondary | ICD-10-CM | POA: Diagnosis not present

## 2014-09-10 DIAGNOSIS — Z720 Tobacco use: Secondary | ICD-10-CM | POA: Diagnosis not present

## 2014-09-10 DIAGNOSIS — Z0000 Encounter for general adult medical examination without abnormal findings: Secondary | ICD-10-CM | POA: Diagnosis present

## 2014-09-10 DIAGNOSIS — Z139 Encounter for screening, unspecified: Secondary | ICD-10-CM

## 2014-09-10 NOTE — ED Notes (Addendum)
Spoke with Dr. Mayford Knife regarding presenting c/o and triage assessment. Per Mayford Knife, MD no protocols are to be done. Patient is not to be dressed out at this time. Triage and place patient back in the lobby to [facility].

## 2014-09-10 NOTE — ED Notes (Signed)
Attempted to call Group Home, received no answer as this time. Will attempt to call in 15 minutes.

## 2014-09-10 NOTE — ED Notes (Addendum)
Patient presents reporting that he came her via bus to see his girlfriend "Rosebud", but found out that she is not here. Patient states, "I am hostile about that. I am angry about it. I need to be admitted over night. I cant be roaming the streets over night." Patient does not need to see a MD, however states, "I can get angry, mad, and physical if you dont meet what I need. I am going to wind up committed if you dont do what I say." Patient reports that he is "slow" and is "psycha-lo-matic". Patient requesting to be admitted over night and he will leave after breakfast and lunch tomorrow; "around 1230 would be good." Patient is from [facility].

## 2014-09-10 NOTE — ED Provider Notes (Signed)
[facility] Emergency Department Provider Note     Time seen: ----------------------------------------- 9:11 PM on 09/10/2014 -----------------------------------------    I have reviewed the triage vital signs and the nursing notes.   HISTORY  Chief Complaint Hostility/anger    HPI [name] is a 54 y.o. male who presents ER because he wants something to eat and a place to stay. Patient states he came to the ER looking for his girlfriend but he finds out that she is not here. States he must get checked out anyway. He was having to stay overnight and so than the and in the leave tomorrow. Patient denies any other complaints, no recent illness.   Past Medical History  Diagnosis Date  . HIV (human immunodeficiency virus infection)   . BPH (benign prostatic hypertrophy)   . Dyslipidemia   . Hypertension   . Hypothyroidism   . Schizoaffective disorder     There are no active problems to display for this patient.   History reviewed. No pertinent past surgical history.  Allergies Review of patient's allergies indicates no known allergies.  Social History History  Substance Use Topics  . Smoking status: Current Every Day Smoker -- 2.00 packs/day    Types: Cigarettes  . Smokeless tobacco: Not on file  . Alcohol Use: Yes    Review of Systems Constitutional: Negative for fever. Eyes: Negative for visual changes. ENT: Negative for sore throat. Cardiovascular: Negative for chest pain. Respiratory: Negative for shortness of breath. Gastrointestinal: Negative for abdominal pain, vomiting and diarrhea. Genitourinary: Negative for dysuria. Musculoskeletal: Negative for back pain. Skin: Negative for rash. Neurological: Negative for headaches, focal weakness or numbness.  10-point ROS otherwise negative.  ____________________________________________   PHYSICAL EXAM:  VITAL SIGNS: ED Triage Vitals  Enc Vitals Group     BP 09/10/14 2032  149/86 mmHg     Pulse Rate 09/10/14 2032 79     Resp 09/10/14 2032 16     Temp 09/10/14 2032 98.6 F (37 C)     Temp Source 09/10/14 2032 Oral     SpO2 09/10/14 2033 97 %     Weight 09/10/14 2033 175 lb (79.379 kg)     Height 09/10/14 2033 6' (1.829 m)     Head Cir --      Peak Flow --      Pain Score 09/10/14 2033 0     Pain Loc --      Pain Edu? --      Excl. in GC? --     Constitutional: Alert and oriented. Well appearing and in no distress. Eyes: Conjunctivae are normal. PERRL. Normal extraocular movements. ENT   Head: Normocephalic and atraumatic.   Nose: No congestion/rhinnorhea.   Mouth/Throat: Mucous membranes are moist.   Neck: No stridor. Hematological/Lymphatic/Immunilogical: No cervical lymphadenopathy. Cardiovascular: Normal rate, regular rhythm. Normal and symmetric distal pulses are present in all extremities. No murmurs, rubs, or gallops. Respiratory: Normal respiratory effort without tachypnea nor retractions. Breath sounds are clear and equal bilaterally. No wheezes/rales/rhonchi. Gastrointestinal: Soft and nontender. No distention. No abdominal bruits. There is no CVA tenderness. Musculoskeletal: Nontender with normal range of motion in all extremities. No joint effusions.  No lower extremity tenderness nor edema. Neurologic:  Normal speech and language. No gross focal neurologic deficits are appreciated. Speech is normal. No gait instability. Skin:  Skin is warm, dry and intact. No rash noted. Psychiatric: Mood and affect are normal. Bizarre affect. Overall acceptable behavior. Not a threat to himself or others.  ____________________________________________  ED COURSE:  Pertinent labs & imaging results that were available during my care of the patient were reviewed by me and considered in my medical decision making (see chart for details). Patient does not meet criteria for admission or psychiatric  consultation ____________________________________________    RADIOLOGY  None  ____________________________________________  FINAL ASSESSMENT AND PLAN  Medical screening exam  Plan: Patient is clear for outpatient follow-up. We'll return him to the lobby at this time.   Emily Filbert, MD   Emily Filbert, MD 09/10/14 2121

## 2014-09-10 NOTE — Discharge Instructions (Signed)
Medical Screening Exam °A medical screening exam has been done. This exam helps find the cause of your problem and determines whether you need emergency treatment. Your exam has shown that you do not need emergency treatment at this point. It is safe for you to go to your caregiver's office or clinic for treatment. You should make an appointment today to see your caregiver as soon as he or she is available. °Depending on your illness, your symptoms and condition can change over time. If your condition gets worse or you develop new or troubling symptoms before you see your caregiver, you should return to the emergency department for further evaluation.  °Document Released: 04/16/2004 Document Revised: 06/01/2011 Document Reviewed: 11/26/2010 °ExitCare® Patient Information ©2015 [employer]. This information is not intended to replace advice given to you by your health care provider. Make sure you discuss any questions you have with your health care provider. ° °

## 2014-09-10 NOTE — ED Notes (Signed)

## 2014-09-11 NOTE — ED Notes (Signed)
BEHAVIORAL HEALTH ROUNDING  Patient sleeping: Yes.  Patient alert and oriented: Sleeping  Behavior appropriate: Yes. ; If no, describe:  Nutrition and fluids offered: Sleeping  Toileting and hygiene offered: Sleeping  Sitter present: yes  Law enforcement present: Yes   

## 2014-09-11 NOTE — ED Notes (Signed)
BEHAVIORAL HEALTH ROUNDING Patient sleeping: Yes.   Patient alert and oriented: yes Behavior appropriate: Yes.  ; If no, describe:  Nutrition and fluids offered: Yes  Toileting and hygiene offered: Yes  Sitter present: yes Law enforcement present: Yes ODS  

## 2014-09-11 NOTE — ED Notes (Signed)
Spoke with someone from Pt's ACT team, stated that she was on her way to pick up patient.

## 2014-09-11 NOTE — ED Notes (Signed)

## 2014-09-11 NOTE — ED Notes (Signed)
BEHAVIORAL HEALTH ROUNDING Patient sleeping: Yes.   Patient alert and oriented: yes Behavior appropriate: Yes.  ; If no, describe:  Nutrition and fluids offered: Yes  Toileting and hygiene offered: Yes  Sitter present: yes Law enforcement present: Yes  

## 2014-09-11 NOTE — ED Notes (Signed)
Attempted to contact group home in regards to pt D/C. Busy signal received at this time.

## 2014-09-11 NOTE — ED Notes (Addendum)
BEHAVIORAL HEALTH ROUNDING  Patient sleeping: Yes.  Patient alert and oriented: Sleeping  Behavior appropriate: Yes. ; If no, describe:  Nutrition and fluids offered: Sleeping  Toileting and hygiene offered: Sleeping  Sitter present: yes  Law enforcement present: Yes   

## 2014-09-11 NOTE — ED Notes (Signed)
Spoke with Bradly Chris at [facility], was told ACTT team would contact in regards to transporting back to group home.

## 2014-09-11 NOTE — ED Notes (Signed)
BEHAVIORAL HEALTH ROUNDING Patient sleeping: No. Patient alert and oriented: yes Behavior appropriate: Yes.  ; If no, describe:  Nutrition and fluids offered: Yes Toileting and hygiene offered: Yes  Sitter present: yes Law enforcement present: Yes ODS  

## 2014-09-11 NOTE — ED Notes (Signed)
NAD noted at time of D/C. Pt denies questions or concerns. Pt ambulatory to the lobby at this time.  

## 2014-09-11 NOTE — ED Notes (Signed)
BEHAVIORAL HEALTH ROUNDING Patient sleeping: Yes.   Patient alert and oriented: yes Behavior appropriate: Yes.  ; If no, describe:  Nutrition and fluids offered: Yes  Toileting and hygiene offered: Yes  Sitter present: yes Law enforcement present: Yes     ENVIRONMENTAL ASSESSMENT Potentially harmful objects out of patient reach: Yes.   Personal belongings secured: Yes.   Patient dressed in hospital provided attire only: Yes.   Plastic bags out of patient reach: Yes.   Patient care equipment (cords, cables, call bells, lines, and drains) shortened, removed, or accounted for: Yes.   Equipment and supplies removed from bottom of stretcher: Yes.   Potentially toxic materials out of patient reach: Yes.   Sharps container removed or out of patient reach: Yes.   

## 2014-10-25 ENCOUNTER — Encounter: Payer: Self-pay | Admitting: Occupational Medicine

## 2014-10-25 ENCOUNTER — Emergency Department
Admission: EM | Admit: 2014-10-25 | Discharge: 2014-10-25 | Disposition: A | Discharge status: Other Acute Inpatient Hospital | Payer: Medicare Other | Attending: Emergency Medicine | Admitting: Emergency Medicine

## 2014-10-25 DIAGNOSIS — F329 Major depressive disorder, single episode, unspecified: Secondary | ICD-10-CM

## 2014-10-25 DIAGNOSIS — Z7982 Long term (current) use of aspirin: Secondary | ICD-10-CM | POA: Diagnosis not present

## 2014-10-25 DIAGNOSIS — Z79899 Other long term (current) drug therapy: Secondary | ICD-10-CM | POA: Insufficient documentation

## 2014-10-25 DIAGNOSIS — F10129 Alcohol abuse with intoxication, unspecified: Secondary | ICD-10-CM | POA: Diagnosis not present

## 2014-10-25 DIAGNOSIS — I10 Essential (primary) hypertension: Secondary | ICD-10-CM | POA: Insufficient documentation

## 2014-10-25 DIAGNOSIS — F1010 Alcohol abuse, uncomplicated: Secondary | ICD-10-CM

## 2014-10-25 DIAGNOSIS — Z720 Tobacco use: Secondary | ICD-10-CM | POA: Insufficient documentation

## 2014-10-25 DIAGNOSIS — F259 Schizoaffective disorder, unspecified: Secondary | ICD-10-CM | POA: Insufficient documentation

## 2014-10-25 DIAGNOSIS — F32A Depression, unspecified: Secondary | ICD-10-CM

## 2014-10-25 DIAGNOSIS — F250 Schizoaffective disorder, bipolar type: Secondary | ICD-10-CM

## 2014-10-25 LAB — URINE DRUG SCREEN, QUALITATIVE (ARMC ONLY)
Amphetamines, Ur Screen: NOT DETECTED
Barbiturates, Ur Screen: NOT DETECTED
Benzodiazepine, Ur Scrn: NOT DETECTED
Cannabinoid 50 Ng, Ur ~~LOC~~: NOT DETECTED
Cocaine Metabolite,Ur ~~LOC~~: NOT DETECTED
MDMA (Ecstasy)Ur Screen: NOT DETECTED
METHADONE SCREEN, URINE: NOT DETECTED
Opiate, Ur Screen: NOT DETECTED
Phencyclidine (PCP) Ur S: NOT DETECTED
Tricyclic, Ur Screen: NOT DETECTED

## 2014-10-25 LAB — COMPREHENSIVE METABOLIC PANEL
ALT: 7 U/L — AB (ref 17–63)
AST: 19 U/L (ref 15–41)
Albumin: 3.9 g/dL (ref 3.5–5.0)
Alkaline Phosphatase: 86 U/L (ref 38–126)
Anion gap: 6 (ref 5–15)
BUN: 8 mg/dL (ref 6–20)
CO2: 24 mmol/L (ref 22–32)
CREATININE: 0.94 mg/dL (ref 0.61–1.24)
Calcium: 8.6 mg/dL — ABNORMAL LOW (ref 8.9–10.3)
Chloride: 108 mmol/L (ref 101–111)
GFR calc Af Amer: >60 mL/min (ref >60)
GFR calc non Af Amer: >60 mL/min (ref >60)
Glucose, Bld: 91 mg/dL (ref 65–99)
Potassium: 3.8 mmol/L (ref 3.5–5.1)
Sodium: 138 mmol/L (ref 135–145)
TOTAL PROTEIN: 6.5 g/dL (ref 6.5–8.1)
Total Bilirubin: 0.4 mg/dL (ref 0.3–1.2)

## 2014-10-25 LAB — CBC WITH DIFFERENTIAL/PLATELET
BASOS ABS: 0 10*3/uL (ref 0–0.1)
Basophils Relative: 1 %
EOS ABS: 0.1 10*3/uL (ref 0–0.7)
Eosinophils Relative: 3 %
HCT: 38.2 % — ABNORMAL LOW (ref 40.0–52.0)
Hemoglobin: 12.9 g/dL — ABNORMAL LOW (ref 13.0–18.0)
Lymphocytes Relative: 50 %
Lymphs Abs: 2 10*3/uL (ref 1.0–3.6)
MCH: 33.6 pg (ref 26.0–34.0)
MCHC: 33.6 g/dL (ref 32.0–36.0)
MCV: 99.8 fL (ref 80.0–100.0)
Monocytes Absolute: 0.3 10*3/uL (ref 0.2–1.0)
Monocytes Relative: 8 %
NEUTROS PCT: 38 %
Neutro Abs: 1.6 10*3/uL (ref 1.4–6.5)
PLATELETS: 220 10*3/uL (ref 150–440)
RBC: 3.83 MIL/uL — ABNORMAL LOW (ref 4.40–5.90)
RDW: 13.4 % (ref 11.5–14.5)
WBC: 4.1 10*3/uL (ref 3.8–10.6)

## 2014-10-25 LAB — VALPROIC ACID LEVEL: VALPROIC ACID LVL: 100 ug/mL (ref 50.0–100.0)

## 2014-10-25 LAB — SALICYLATE LEVEL: Salicylate Lvl: <4 mg/dL (ref 2.8–30.0)

## 2014-10-25 LAB — ACETAMINOPHEN LEVEL: Acetaminophen (Tylenol), Serum: <10 ug/mL — ABNORMAL LOW (ref 10–30)

## 2014-10-25 LAB — ETHANOL: Alcohol, Ethyl (B): <5 mg/dL (ref <5)

## 2014-10-25 MED ORDER — ASPIRIN EC 81 MG PO TBEC
81.0000 mg | DELAYED_RELEASE_TABLET | Freq: Once | ORAL | Status: AC
  Administered 2014-10-25: 81 mg via ORAL
  Filled 2014-10-25: qty 1

## 2014-10-25 MED ORDER — DIVALPROEX SODIUM ER 500 MG PO TB24
500.0000 mg | ORAL_TABLET | Freq: Every day | ORAL | Status: DC
  Administered 2014-10-25: 500 mg via ORAL
  Filled 2014-10-25: qty 1

## 2014-10-25 MED ORDER — FLUPHENAZINE HCL 5 MG PO TABS
5.0000 mg | ORAL_TABLET | Freq: Every day | ORAL | Status: DC
  Administered 2014-10-25: 5 mg via ORAL
  Filled 2014-10-25: qty 1

## 2014-10-25 MED ORDER — BENZTROPINE MESYLATE 1 MG PO TABS: 1.0000 mg | ORAL_TABLET | Freq: Every day | ORAL | Status: DC

## 2014-10-25 MED ORDER — SIMVASTATIN 40 MG PO TABS: 20.0000 mg | ORAL_TABLET | Freq: Every day | ORAL | Status: DC

## 2014-10-25 MED ORDER — LEVOTHYROXINE SODIUM 25 MCG PO TABS
25.0000 ug | ORAL_TABLET | Freq: Every day | ORAL | Status: DC
  Filled 2014-10-25: qty 1

## 2014-10-25 MED ORDER — TAMSULOSIN HCL 0.4 MG PO CAPS: 0.4000 mg | ORAL_CAPSULE | Freq: Every day | ORAL | Status: DC

## 2014-10-25 MED ORDER — NICOTINE 10 MG IN INHA: 1.0000 | RESPIRATORY_TRACT | Status: DC | PRN

## 2014-10-25 MED ORDER — DOCUSATE SODIUM 100 MG PO CAPS: 100.0000 mg | ORAL_CAPSULE | Freq: Every day | ORAL | Status: DC

## 2014-10-25 MED ORDER — DIVALPROEX SODIUM ER 500 MG PO TB24: 1000.0000 mg | ORAL_TABLET | Freq: Every day | ORAL | Status: DC

## 2014-10-25 MED ORDER — LITHIUM CARBONATE 300 MG PO CAPS
600.0000 mg | ORAL_CAPSULE | Freq: Two times a day (BID) | ORAL | Status: DC
  Filled 2014-10-25 (×2): qty 2

## 2014-10-25 MED ORDER — TERAZOSIN HCL 1 MG PO CAPS
1.0000 mg | ORAL_CAPSULE | Freq: Every day | ORAL | Status: DC
  Filled 2014-10-25: qty 1

## 2014-10-25 MED ORDER — FLUPHENAZINE HCL 5 MG PO TABS
10.0000 mg | ORAL_TABLET | Freq: Every day | ORAL | Status: DC
  Filled 2014-10-25: qty 1

## 2014-10-25 MED ORDER — CARVEDILOL 6.25 MG PO TABS
3.1250 mg | ORAL_TABLET | Freq: Two times a day (BID) | ORAL | Status: DC
  Administered 2014-10-25: 3.125 mg via ORAL
  Filled 2014-10-25 (×3): qty 1

## 2014-10-25 MED ORDER — VITAMIN D 1000 UNITS PO TABS
2000.0000 [IU] | ORAL_TABLET | Freq: Every day | ORAL | Status: DC
  Administered 2014-10-25: 2000 [IU] via ORAL
  Filled 2014-10-25: qty 2

## 2014-10-25 MED ORDER — EFAVIRENZ-EMTRICITAB-TENOFOVIR 600-200-300 MG PO TABS
1.0000 | ORAL_TABLET | Freq: Every day | ORAL | Status: DC
  Filled 2014-10-25: qty 1

## 2014-10-25 NOTE — ED Notes (Addendum)
BEHAVIORAL HEALTH ROUNDING Patient sleeping: Yes.   Patient alert and oriented: yes Behavior appropriate: Yes.  ; If no, describe:  Nutrition and fluids offered: Yes  Toileting and hygiene offered: Yes  Sitter present: yes Law enforcement present: Yes ODS  

## 2014-10-25 NOTE — ED Notes (Signed)
Pt presents via ems from [facility] with c/o SI and HI walked to room stating he "took a shit in the parking lot of [employer] needs a shower". Pt states " thoughts of cutting himself jumping in front of a car." Reported" argument with worker of [employer] when she asked him to get off the property he wanted to hurt her." Pt reports being depressed and not eating well. Pt lives at a group home " [facility]" Phone number [phone] its on [address]  Pt came with a briefcase and back bag. Pt doesn't take his daily med's in a while. Noted tremor to left arm states that he has had it for a while likes he has parkinson syndrome. Smokes 2-3 packs a day drinks beer 2-3 times a week. Pt's clothes are dirty and dishevel.

## 2014-10-25 NOTE — Progress Notes (Signed)
LCSW called Group home again to [facility] [phone]. Left another voice message.

## 2014-10-25 NOTE — ED Notes (Signed)
ENVIRONMENTAL ASSESSMENT Potentially harmful objects out of patient reach: Yes.   Personal belongings secured: Yes.   Patient dressed in hospital provided attire only: Yes.   Plastic bags out of patient reach: Yes.   Patient care equipment (cords, cables, call bells, lines, and drains) shortened, removed, or accounted for: Yes.   Equipment and supplies removed from bottom of stretcher: Yes.   Potentially toxic materials out of patient reach: Yes.   Sharps container removed or out of patient reach: Yes.     BEHAVIORAL HEALTH ROUNDING Patient sleeping: No. Patient alert and oriented: yes Behavior appropriate: Yes.  ; If no, describe:  Nutrition and fluids offered: yes Toileting and hygiene offered: Yes  Sitter present: q15 minute observations and security camera monitoring Law enforcement present: Yes  ODS  

## 2014-10-25 NOTE — ED Notes (Signed)
Pt transferred into ED BHU room 2   Patient assigned to appropriate care area. Patient oriented to unit/care area: Informed that, for their safety, care areas are designed for safety and monitored by security cameras at all times; Visiting hours and phone times explained to patient. Patient verbalizes understanding, and verbal contract for safety obtained.     

## 2014-10-25 NOTE — ED Notes (Signed)
Pt to be discharged back to his group home - I have received a call from stat desk and staff from the group home is here to pick him up  A book bag, a briefcase and a pt belongings bag returned to him  - he verbalizes that he has received back all items that he came here with  Pt discharged with blue scrubs on  - instructions reviewed with the pt and [facility] staff and they both verbalized agreement and understanding

## 2014-10-25 NOTE — Progress Notes (Signed)
LCSW called group home provider and left message, awaiting call back.

## 2014-10-25 NOTE — Discharge Instructions (Signed)
Alcohol Use Disorder Alcohol use disorder is a mental disorder. It is not a one-time incident of heavy drinking. Alcohol use disorder is the excessive and uncontrollable use of alcohol over time that leads to problems with functioning in one or more areas of daily living. People with this disorder risk harming themselves and others when they drink to excess. Alcohol use disorder also can cause other mental disorders, such as mood and anxiety disorders, and serious physical problems. People with alcohol use disorder often misuse other drugs.  Alcohol use disorder is common and widespread. Some people with this disorder drink alcohol to cope with or escape from negative life events. Others drink to relieve chronic pain or symptoms of mental illness. People with a family history of alcohol use disorder are at higher risk of losing control and using alcohol to excess.  SYMPTOMS  Signs and symptoms of alcohol use disorder may include the following:   Consumption ofalcohol inlarger amounts or over a longer period of time than intended.  Multiple unsuccessful attempts to cutdown or control alcohol use.   A great deal of time spent obtaining alcohol, using alcohol, or recovering from the effects of alcohol (hangover).  A strong desire or urge to use alcohol (cravings).   Continued use of alcohol despite problems at work, school, or home because of alcohol use.   Continued use of alcohol despite problems in relationships because of alcohol use.  Continued use of alcohol in situations when it is physically hazardous, such as driving a car.  Continued use of alcohol despite awareness of a physical or psychological problem that is likely related to alcohol use. Physical problems related to alcohol use can involve the brain, heart, liver, stomach, and intestines. Psychological problems related to alcohol use include intoxication, depression, anxiety, psychosis, delirium, and dementia.   The need for  increased amounts of alcohol to achieve the same desired effect, or a decreased effect from the consumption of the same amount of alcohol (tolerance).  Withdrawal symptoms upon reducing or stopping alcohol use, or alcohol use to reduce or avoid withdrawal symptoms. Withdrawal symptoms include:  Racing heart.  Hand tremor.  Difficulty sleeping.  Nausea.  Vomiting.  Hallucinations.  Restlessness.  Seizures. DIAGNOSIS Alcohol use disorder is diagnosed through an assessment by your health care provider. Your health care provider may start by asking three or four questions to screen for excessive or problematic alcohol use. To confirm a diagnosis of alcohol use disorder, at least two symptoms must be present within a 12-month period. The severity of alcohol use disorder depends on the number of symptoms:  Mild--two or three.  Moderate--four or five.  Severe--six or more. Your health care provider may perform a physical exam or use results from lab tests to see if you have physical problems resulting from alcohol use. Your health care provider may refer you to a mental health professional for evaluation. TREATMENT  Some people with alcohol use disorder are able to reduce their alcohol use to low-risk levels. Some people with alcohol use disorder need to quit drinking alcohol. When necessary, mental health professionals with specialized training in substance use treatment can help. Your health care provider can help you decide how severe your alcohol use disorder is and what type of treatment you need. The following forms of treatment are available:   Detoxification. Detoxification involves the use of prescription medicines to prevent alcohol withdrawal symptoms in the first week after quitting. This is important for people with a history of symptoms   of withdrawal and for heavy drinkers who are likely to have withdrawal symptoms. Alcohol withdrawal can be dangerous and, in severe cases, cause  death. Detoxification is usually provided in a hospital or in-patient substance use treatment facility.  Counseling or talk therapy. Talk therapy is provided by substance use treatment counselors. It addresses the reasons people use alcohol and ways to keep them from drinking again. The goals of talk therapy are to help people with alcohol use disorder find healthy activities and ways to cope with life stress, to identify and avoid triggers for alcohol use, and to handle cravings, which can cause relapse.  Medicines.Different medicines can help treat alcohol use disorder through the following actions:  Decrease alcohol cravings.  Decrease the positive reward response felt from alcohol use.  Produce an uncomfortable physical reaction when alcohol is used (aversion therapy).  Support groups. Support groups are run by people who have quit drinking. They provide emotional support, advice, and guidance. These forms of treatment are often combined. Some people with alcohol use disorder benefit from intensive combination treatment provided by specialized substance use treatment centers. Both inpatient and outpatient treatment programs are available. Document Released: 04/16/2004 Document Revised: 07/24/2013 Document Reviewed: 06/16/2012 ExitCare Patient Information 2015 [employer]. This information is not intended to replace advice given to you by your health care provider. Make sure you discuss any questions you have with your health care provider.  

## 2014-10-25 NOTE — ED Notes (Signed)
ED BHU PLACEMENT JUSTIFICATION Is the patient under IVC or is there intent for IVC: no  Is the patient medically cleared: Yes.   Is there vacancy in the ED BHU: Yes.   Is the population mix appropriate for patient: Yes.   Is the patient awaiting placement in inpatient or outpatient setting: return to group home Has the patient had a psychiatric consult: yes Survey of unit performed for contraband, proper placement and condition of furniture, tampering with fixtures in bathroom, shower, and each patient room: Yes.  ; Findings:  APPEARANCE/BEHAVIOR Calm and cooperative NEURO ASSESSMENT Orientation: oriented x3  Denies pain Hallucinations: No.None noted (Hallucinations) Speech: Normal Gait: normal RESPIRATORY ASSESSMENT even unlabored respirations  CARDIOVASCULAR ASSESSMENT Pulses equal  regular rate  Skin warm and dry GASTROINTESTINAL ASSESSMENT no GI complaint EXTREMITIES Full ROM PLAN OF CARE Provide calm/safe environment. Vital signs assessed twice daily. ED BHU Assessment once each 12-hour shift. Collaborate with intake RN daily or as condition indicates. Assure the ED provider has rounded once each shift. Provide and encourage hygiene. Provide redirection as needed. Assess for escalating behavior; address immediately and inform ED provider.  Assess family dynamic and appropriateness for visitation as needed: Yes.  ; If necessary, describe findings:  Educate the patient/family about BHU procedures/visitation: Yes.  ; If necessary, describe findings:

## 2014-10-25 NOTE — ED Notes (Signed)
ED BHU PLACEMENT JUSTIFICATION Is the patient under IVC or is there intent for IVC: No. Is the patient medically cleared: No. Is there vacancy in the ED BHU: No Is the population mix appropriate for patient: Yes.   Is the patient awaiting placement in inpatient or outpatient setting: No. Has the patient had a psychiatric consult: No. Survey of unit performed for contraband, proper placement and condition of furniture, tampering with fixtures in bathroom, shower, and each patient room: Yes.  ; Findings:  APPEARANCE/BEHAVIOR uncooperative and pt will open eyes and mumble but will not speak directly to staff. NEURO ASSESSMENT Orientation: person Hallucinations: No.None noted (Hallucinations) Speech: Garbled Gait: normal RESPIRATORY ASSESSMENT Normal expansion.  Clear to auscultation.  No rales, rhonchi, or wheezing. CARDIOVASCULAR ASSESSMENT regular rate and rhythm, S1, S2 normal, no murmur, click, rub or gallop GASTROINTESTINAL ASSESSMENT soft, nontender, BS WNL, no r/g EXTREMITIES normal strength, tone, and muscle mass PLAN OF CARE Provide calm/safe environment. Vital signs assessed twice daily. ED BHU Assessment once each 12-hour shift. Collaborate with intake RN daily or as condition indicates. Assure the ED provider has rounded once each shift. Provide and encourage hygiene. Provide redirection as needed. Assess for escalating behavior; address immediately and inform ED provider.  Assess family dynamic and appropriateness for visitation as needed: Yes.  ; If necessary, describe findings:  Educate the patient/family about BHU procedures/visitation: Yes.  ; If necessary, describe findings:

## 2014-10-25 NOTE — ED Notes (Signed)
BEHAVIORAL HEALTH ROUNDING Patient sleeping: Yes.   Patient alert and oriented: no Behavior appropriate: Yes.  ; If no, describe:  Nutrition and fluids offered: Yes  Toileting and hygiene offered: Yes  Sitter present: yes Law enforcement present: Yes ODS 

## 2014-10-25 NOTE — ED Notes (Signed)
Pt states he wet his bed. Linens change on pt's bed and new set of paper scrubs given to patient.

## 2014-10-25 NOTE — Consult Note (Signed)
[facility] Consult   Reason for Consult:  Consult for this 54 year old man with history of schizoaffective disorder and alcohol abuse who came voluntarily to the hospital Referring Physician:  Clearnce Hasten Patient Identification: [name] MRN:  [id number] Principal Diagnosis: Schizoaffective disorder Diagnosis:   Patient Active Problem List   Diagnosis Date Noted  . Schizoaffective disorder [F25.9] 10/25/2014  . Alcohol abuse [F10.10] 10/25/2014    Total Time spent with patient: 1 hour  Subjective:   [name] is a 54 y.o. male patient admitted with "I just don't want to be at the group home".  HPI:  Patient came to the emergency room voluntarily after calling 911 himself. He states that his mood has been feeling more irritable recently. He's been sleeping poorly for a couple days. He particularly is upset about being at his group home. He denies that he is having any suicidal thoughts or homicidal thoughts. He says he stopped taking his medicine for a couple days out of irritability at the group home. He denies that he is having acute auditory hallucinations. Says he has occasional visual hallucinations which are chronic. Drank some beer earlier today and had a mildly positive alcohol level on presentation.  Past psychiatric history is positive for schizoaffective disorder. He's had multiple hospitalizations and visits to the emergency room. In the past he is often presented as manic and agitated. Does not have a history of suicide attempts does have a history of agitated behavior.  Substance abuse history: History of alcohol abuse also some history of cocaine use in the past.  Medical history: Patient is HIV positive also has high blood pressure dyslipidemia.  Social history: Resides at a group home. Not very involved with his family. Chronically institutionalized. HPI Elements:   Quality:  Dysphoria and irritability. Severity:  Moderate. Timing:  Last few  days. Duration:  Already resolving. Context:  Some substance abuse and chronic irritability.  Past Medical History:  Past Medical History  Diagnosis Date  . HIV (human immunodeficiency virus infection)   . BPH (benign prostatic hypertrophy)   . Dyslipidemia   . Hypertension   . Hypothyroidism   . Schizoaffective disorder    History reviewed. No pertinent past surgical history. Family History: History reviewed. No pertinent family history. Social History:  History  Alcohol Use  . 1.2 - 1.8 oz/week  . 2-3 Cans of beer per week     History  Drug Use No    History   Social History  . Marital Status: Single    Spouse Name: N/A  . Number of Children: N/A  . Years of Education: N/A   Social History Main Topics  . Smoking status: Current Every Day Smoker -- 2.00 packs/day    Types: Cigarettes  . Smokeless tobacco: Not on file  . Alcohol Use: 1.2 - 1.8 oz/week    2-3 Cans of beer per week  . Drug Use: No  . Sexual Activity: Not on file   Other Topics Concern  . None   Social History Narrative   Additional Social History:    History of alcohol / drug use?: Yes Longest period of sobriety (when/how long): 4 days Name of Substance 1: ETOH 1 - Age of First Use: Pt reports he was 67 or 54 years old when his grandmother started putting bear in his milk. 1 - Amount (size/oz): 1 or 2 16 oz [employer] 1 - Frequency: daily 1 - Duration: once or twice a day 1 - Last Use / Amount:  this morning                   Allergies:  No Known Allergies  Labs:  Results for orders placed or performed during the hospital encounter of 10/25/14 (from the past 48 hour(s))  Comprehensive metabolic panel     Status: Abnormal   Collection Time: 10/25/14  5:46 AM  Result Value Ref Range   Sodium 138 135 - 145 mmol/L   Potassium 3.8 3.5 - 5.1 mmol/L   Chloride 108 101 - 111 mmol/L   CO2 24 22 - 32 mmol/L   Glucose, Bld 91 65 - 99 mg/dL   BUN 8 6 - 20 mg/dL   Creatinine, Ser 0.94 0.61 -  1.24 mg/dL   Calcium 8.6 (L) 8.9 - 10.3 mg/dL   Total Protein 6.5 6.5 - 8.1 g/dL   Albumin 3.9 3.5 - 5.0 g/dL   AST 19 15 - 41 U/L   ALT 7 (L) 17 - 63 U/L   Alkaline Phosphatase 86 38 - 126 U/L   Total Bilirubin 0.4 0.3 - 1.2 mg/dL   GFR calc non Af Amer >60 >60 mL/min   GFR calc Af Amer >60 >60 mL/min    Comment: (NOTE) The eGFR has been calculated using the CKD EPI equation. This calculation has not been validated in all clinical situations. eGFR's persistently <60 mL/min signify possible Chronic Kidney Disease.    Anion gap 6 5 - 15  Ethanol     Status: None   Collection Time: 10/25/14  5:46 AM  Result Value Ref Range   Alcohol, Ethyl (B) <5 <5 mg/dL    Comment:        LOWEST DETECTABLE LIMIT FOR SERUM ALCOHOL IS 5 mg/dL FOR MEDICAL PURPOSES ONLY   CBC with Differential     Status: Abnormal   Collection Time: 10/25/14  5:46 AM  Result Value Ref Range   WBC 4.1 3.8 - 10.6 K/uL   RBC 3.83 (L) 4.40 - 5.90 MIL/uL   Hemoglobin 12.9 (L) 13.0 - 18.0 g/dL   HCT 38.2 (L) 40.0 - 52.0 %   MCV 99.8 80.0 - 100.0 fL   MCH 33.6 26.0 - 34.0 pg   MCHC 33.6 32.0 - 36.0 g/dL   RDW 13.4 11.5 - 14.5 %   Platelets 220 150 - 440 K/uL   Neutrophils Relative % 38 %   Neutro Abs 1.6 1.4 - 6.5 K/uL   Lymphocytes Relative 50 %   Lymphs Abs 2.0 1.0 - 3.6 K/uL   Monocytes Relative 8 %   Monocytes Absolute 0.3 0.2 - 1.0 K/uL   Eosinophils Relative 3 %   Eosinophils Absolute 0.1 0 - 0.7 K/uL   Basophils Relative 1 %   Basophils Absolute 0.0 0 - 0.1 K/uL  Acetaminophen level     Status: Abnormal   Collection Time: 10/25/14  5:46 AM  Result Value Ref Range   Acetaminophen (Tylenol), Serum <10 (L) 10 - 30 ug/mL    Comment:        THERAPEUTIC CONCENTRATIONS VARY SIGNIFICANTLY. A RANGE OF 10-30 ug/mL MAY BE AN EFFECTIVE CONCENTRATION FOR MANY PATIENTS. HOWEVER, SOME ARE BEST TREATED AT CONCENTRATIONS OUTSIDE THIS RANGE. ACETAMINOPHEN CONCENTRATIONS >150 ug/mL AT 4 HOURS AFTER INGESTION  AND >50 ug/mL AT 12 HOURS AFTER INGESTION ARE OFTEN ASSOCIATED WITH TOXIC REACTIONS.   Salicylate level     Status: None   Collection Time: 10/25/14  5:46 AM  Result Value Ref Range   Salicylate Lvl <  4.0 2.8 - 30.0 mg/dL  Urine Drug Screen, Qualitative     Status: None   Collection Time: 10/25/14 12:15 PM  Result Value Ref Range   Tricyclic, Ur Screen NONE DETECTED NONE DETECTED   Amphetamines, Ur Screen NONE DETECTED NONE DETECTED   MDMA (Ecstasy)Ur Screen NONE DETECTED NONE DETECTED   Cocaine Metabolite,Ur [place] NONE DETECTED NONE DETECTED   Opiate, Ur Screen NONE DETECTED NONE DETECTED   Phencyclidine (PCP) Ur S NONE DETECTED NONE DETECTED   Cannabinoid 50 Ng, Ur [place] NONE DETECTED NONE DETECTED   Barbiturates, Ur Screen NONE DETECTED NONE DETECTED   Benzodiazepine, Ur Scrn NONE DETECTED NONE DETECTED   Methadone Scn, Ur NONE DETECTED NONE DETECTED    Comment: (NOTE) 161  Tricyclics, urine               Cutoff 1000 ng/mL 200  Amphetamines, urine             Cutoff 1000 ng/mL 300  MDMA (Ecstasy), urine           Cutoff 500 ng/mL 400  Cocaine Metabolite, urine       Cutoff 300 ng/mL 500  Opiate, urine                   Cutoff 300 ng/mL 600  Phencyclidine (PCP), urine      Cutoff 25 ng/mL 700  Cannabinoid, urine              Cutoff 50 ng/mL 800  Barbiturates, urine             Cutoff 200 ng/mL 900  Benzodiazepine, urine           Cutoff 200 ng/mL 1000 Methadone, urine                Cutoff 300 ng/mL 1100 1200 The urine drug screen provides only a preliminary, unconfirmed 1300 analytical test result and should not be used for non-medical 1400 purposes. Clinical consideration and professional judgment should 1500 be applied to any positive drug screen result due to possible 1600 interfering substances. A more specific alternate chemical method 1700 must be used in order to obtain a confirmed analytical result.  1800 Gas chromato graphy / mass spectrometry (GC/MS) is the  preferred 1900 confirmatory method.   Valproic acid level     Status: None   Collection Time: 10/25/14 12:49 PM  Result Value Ref Range   Valproic Acid Lvl 100 50.0 - 100.0 ug/mL    Comment: RESULT CONFIRMED BY MANUAL DILUTION    Vitals: Blood pressure 101/66, pulse 80, temperature 97.4 F (36.3 C), temperature source Oral, resp. rate 18, height 6' 2"  (1.88 m), weight 77.111 kg (170 lb), SpO2 99 %.  Risk to Self: Suicidal Ideation: Yes-Currently Present Suicidal Intent: Yes-Currently Present Is patient at risk for suicide?: Yes Suicidal Plan?: Yes-Currently Present Specify Current Suicidal Plan: Pt planned to either cut himself or jump in front of car. 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 Current Homicidal Intent: No Current Homicidal Plan: No Access to Homicidal Means: No Identified Victim: [employer] History of harm to  others?: No Assessment of Violence: On admission Violent Behavior Description: None Does patient have access to weapons?: No Criminal Charges Pending?: No Does patient have a court date: No Prior Inpatient Therapy: Prior Inpatient Therapy: No Prior Outpatient Therapy: Prior Outpatient Therapy: Yes Prior Therapy Facilty/Provider(s): [facility] Reason for Treatment: mental illness Does patient have an ACCT team?: Yes ([facility]) Does patient have Intensive In-House Services?  : No Does patient have [facility] services? : No Does patient have [facility] services?: No  Current Facility-Administered Medications  Medication Dose Route Frequency Provider Last  Rate Last Dose  . benztropine (COGENTIN) tablet 1 mg  1 mg Oral QHS Daymon Larsen, MD      . carvedilol (COREG) tablet 3.125 mg  3.125 mg Oral BID WC Daymon Larsen, MD   3.125 mg at 10/25/14 1306  . cholecalciferol (VITAMIN D) tablet 2,000 Units  2,000 Units Oral Daily Daymon Larsen, MD   2,000 Units at 10/25/14 1306  . divalproex (DEPAKOTE ER) 24 hr tablet 1,000 mg  1,000 mg Oral QHS Daymon Larsen, MD      . divalproex (DEPAKOTE ER) 24 hr tablet 500 mg  500 mg Oral Daily Daymon Larsen, MD   500 mg at 10/25/14 1253  . docusate sodium (COLACE) capsule 100 mg  100 mg Oral QHS Daymon Larsen, MD      . efavirenz-emtricitabine-tenofovir (ATRIPLA) [phone] MG per tablet 1 tablet  1 tablet Oral QHS Daymon Larsen, MD      . fluPHENAZine (PROLIXIN) tablet 10 mg  10 mg Oral QHS Daymon Larsen, MD      . fluPHENAZine (PROLIXIN) tablet 5 mg  5 mg Oral Daily Daymon Larsen, MD   5 mg at 10/25/14 1307  . [START ON 10/26/2014] levothyroxine (SYNTHROID, LEVOTHROID) tablet 25 mcg  25 mcg Oral QAC breakfast Daymon Larsen, MD      . lithium carbonate capsule 600 mg  600 mg Oral BID WC Daymon Larsen, MD      . nicotine (NICOTROL) 10 MG inhaler 1 continuous puffing  1 continuous puffing Inhalation PRN Daymon Larsen, MD      . simvastatin (ZOCOR) tablet 20 mg  20 mg Oral QHS Daymon Larsen, MD      . tamsulosin [facility]) capsule 0.4 mg  0.4 mg Oral QHS Daymon Larsen, MD      . terazosin (HYTRIN) capsule 1 mg  1 mg Oral QHS Daymon Larsen, MD       Current Outpatient Prescriptions  Medication Sig Dispense Refill  . aspirin 81 MG tablet Take 81 mg by mouth daily.    . benztropine (COGENTIN) 1 MG tablet Take 1 mg by mouth at bedtime.    . carvedilol (COREG) 3.125 MG tablet Take 3.125 mg by mouth 2 (two) times daily with a meal.    . cholecalciferol (VITAMIN D) 1000 UNITS tablet Take 2,000 Units by mouth daily.    . divalproex (DEPAKOTE) 500 MG DR tablet Take 500 mg by mouth every morning.     . divalproex (DEPAKOTE) 500 MG DR tablet Take 1,000 mg by mouth at bedtime.    . docusate sodium (COLACE) 100 MG capsule Take 100 mg by mouth daily.    Marland Kitchen efavirenz-emtricitabine-tenofovir (ATRIPLA) 600-200-300 MG per tablet Take 1 tablet by mouth at bedtime.    . fluPHENAZine (PROLIXIN) 10 MG tablet Take 10 mg by mouth at bedtime.    . fluPHENAZine (PROLIXIN) 5 MG tablet Take  5 mg by mouth every morning.    Marland Kitchen levothyroxine (SYNTHROID, LEVOTHROID) 25 MCG tablet Take 25 mcg by mouth daily before breakfast.    . lithium 600 MG capsule Take 600 mg by mouth 2 (two) times daily with a meal.    . simvastatin (ZOCOR) 20 MG tablet Take 20 mg by mouth daily.    . tamsulosin (FLOMAX) 0.4 MG CAPS capsule Take 0.4 mg by mouth at bedtime.    Marland Kitchen terazosin (HYTRIN) 1 MG capsule Take 1 mg by mouth at bedtime.    . paliperidone (INVEGA SUSTENNA) 156 MG/ML SUSP injection Inject 156 mg into the muscle every 30 (thirty) days.      Musculoskeletal: Strength & Muscle Tone: within normal limits Gait & Station: normal Patient leans: N/A  Psychiatric Specialty Exam: Physical Exam  Constitutional: He appears well-developed and well-nourished.  HENT:  Head: Normocephalic and atraumatic.  Eyes: Conjunctivae are normal. Pupils are equal, round, and reactive to light.  Neck: Normal range of motion.  Cardiovascular: Normal heart sounds.   Respiratory: Effort normal.  GI: Soft.  Musculoskeletal: Normal range of motion.  Neurological: He is alert.  Skin: Skin is warm and dry.  Psychiatric: Thought content normal. His affect is blunt. His speech is delayed. He is withdrawn. Cognition and memory are normal. He expresses impulsivity.    Review of Systems  Constitutional: Negative.   HENT: Negative.   Eyes: Negative.   Respiratory: Negative.   Cardiovascular: Negative.   Gastrointestinal: Negative.   Musculoskeletal: Negative.   Skin: Negative.   Neurological: Negative.   Psychiatric/Behavioral: Positive for  hallucinations and substance abuse. Negative for depression, suicidal ideas and memory loss. The patient is not nervous/anxious and does not have insomnia.     Blood pressure 101/66, pulse 80, temperature 97.4 F (36.3 C), temperature source Oral, resp. rate 18, height 6' 2"  (1.88 m), weight 77.111 kg (170 lb), SpO2 99 %.Body mass index is 21.82 kg/(m^2).  General Appearance: Disheveled  Eye Contact::  Minimal  Speech:  Slow  Volume:  Decreased  Mood:  Euthymic  Affect:  Congruent  Thought Process:  Goal Directed  Orientation:  Full (Time, Place, and Person)  Thought Content:  Hallucinations: Visual  Suicidal Thoughts:  No  Homicidal Thoughts:  No  Memory:  Immediate;   Fair Recent;   Fair Remote;   Fair  Judgement:  Fair  Insight:  Fair  Psychomotor Activity:  Decreased  Concentration:  Fair  Recall:  [employer] [place]  Language: Fair  Akathisia:  No  Handed:  Right  AIMS (if indicated):     Assets:  Communication Skills Desire for Improvement Financial Resources/Insurance Housing Resilience  ADL's:  Intact  Cognition: WNL  Sleep:      Medical Decision Making: Established Problem, Stable/Improving (1), Review of Psycho-Social Stressors (1), Review or order clinical lab tests (1), Independent Review of image, tracing or specimen (2) and Review of Medication Regimen & Side Effects (2)  Treatment Plan Summary: Plan Patient is not expressing any suicidal or homicidal ideation. He has had some recent noncompliance with medication out of petulance at his group home and his act team. He is currently not acutely psychotic. He was mildly irritable earlier today but admits that he only came here to try and get away from the group home. At this point he does not evident indication for admission to the psychiatric hospital. Counseling completed about the importance of staying on his medicine. Patient be referred back to his  act team follow-up and his group home were willing to  have him back. Case discussed with emergency room doctor.  Plan:  Patient does not meet criteria for psychiatric inpatient admission. Supportive therapy provided about ongoing stressors. Disposition: Discharged from emergency room at the discretion of ER doctor  Alethia Berthold 10/25/2014 6:58 PM

## 2014-10-25 NOTE — ED Notes (Signed)
BEHAVIORAL HEALTH ROUNDING Patient sleeping: Yes.   Patient alert and oriented: not applicable Behavior appropriate: Yes.  ; If no, describe:  Nutrition and fluids offered: Yes  Toileting and hygiene offered: Yes  Sitter present: yes Law enforcement present: Yes ODS 

## 2014-10-25 NOTE — ED Provider Notes (Signed)
[facility] Emergency Department Provider Note  Time seen: 6:02 AM  I have reviewed the triage vital signs and the nursing notes.   HISTORY  Chief Complaint Mental Health Problem    HPI [name] is a 54 y.o. male with a past medical history of HIV, hyperlipidemia, hypertension, schizoaffective disorder who presents the emergency department for depression. According to the patient he lives at a group home currently. He walked down to the [facility] and called EMS to bring him to the hospital. He states depression, and he feels that he needs to be admitted for 3 or 4 days. When asked these can hurt himself he vaguely says I don't know, but denies any specific plan. Patient admits EtOH use tonight.Asking for remote watch TV.     Past Medical History  Diagnosis Date  . HIV (human immunodeficiency virus infection)   . BPH (benign prostatic hypertrophy)   . Dyslipidemia   . Hypertension   . Hypothyroidism   . Schizoaffective disorder     There are no active problems to display for this patient.   No past surgical history on file.  Current Outpatient Rx  Name  Route  Sig  Dispense  Refill  . aspirin 81 MG tablet   Oral   Take 81 mg by mouth daily.         . benztropine (COGENTIN) 1 MG tablet   Oral   Take 1 mg by mouth at bedtime.         . carvedilol (COREG) 3.125 MG tablet   Oral   Take 3.125 mg by mouth 2 (two) times daily with a meal.         . cholecalciferol (VITAMIN D) 1000 UNITS tablet   Oral   Take 2,000 Units by mouth daily.         . divalproex (DEPAKOTE) 500 MG DR tablet   Oral   Take 500 mg by mouth every morning.         . divalproex (DEPAKOTE) 500 MG DR tablet   Oral   Take 1,000 mg by mouth at bedtime.         . docusate sodium (COLACE) 100 MG capsule   Oral   Take 100 mg by mouth daily.         Marland Kitchen efavirenz-emtricitabine-tenofovir (ATRIPLA) 600-200-300 MG per tablet   Oral   Take 1 tablet by mouth at  bedtime.         . fluPHENAZine (PROLIXIN) 10 MG tablet   Oral   Take 10 mg by mouth at bedtime.         . fluPHENAZine (PROLIXIN) 5 MG tablet   Oral   Take 5 mg by mouth every morning.         Marland Kitchen levothyroxine (SYNTHROID, LEVOTHROID) 25 MCG tablet   Oral   Take 25 mcg by mouth daily before breakfast.         . lithium 600 MG capsule   Oral   Take 600 mg by mouth 2 (two) times daily with a meal.         . paliperidone (INVEGA SUSTENNA) 156 MG/ML SUSP injection   Intramuscular   Inject 156 mg into the muscle every 30 (thirty) days.         . simvastatin (ZOCOR) 20 MG tablet   Oral   Take 20 mg by mouth daily.         . tamsulosin (FLOMAX) 0.4 MG CAPS capsule  Oral   Take 0.4 mg by mouth at bedtime.         Marland Kitchen terazosin (HYTRIN) 1 MG capsule   Oral   Take 1 mg by mouth at bedtime.           Allergies Review of patient's allergies indicates no known allergies.  No family history on file.  Social History History  Substance Use Topics  . Smoking status: Current Every Day Smoker -- 2.00 packs/day    Types: Cigarettes  . Smokeless tobacco: Not on file  . Alcohol Use: Yes    Review of Systems Constitutional: Negative for fever. Cardiovascular: Negative for chest pain. Respiratory: Negative for shortness of breath. Gastrointestinal: Negative for abdominal pain Psychiatric:Vague/passive SI, no HI, admits depression  10-point ROS otherwise negative.  ____________________________________________   PHYSICAL EXAM:   Constitutional: Alert and oriented. Well appearing and in no distress. Eyes: Normal exam ENT   Head: Normocephalic and atraumatic Cardiovascular: Normal rate, regular rhythm.  Respiratory: Normal respiratory effort without tachypnea nor retractions. Breath sounds are clear and equal bilaterally. No wheezes/rales/rhonchi. Gastrointestinal: Soft and nontender. No distention.  Musculoskeletal: Nontender with normal range of  motion in all extremities.  Neurologic:  Normal speech and language. No gross focal neurologic deficits Skin:  Skin is warm, dry and intact.  Psychiatric: Vague SI, no HI. Admits depression.  ____________________________________________   INITIAL IMPRESSION / ASSESSMENT AND PLAN / ED COURSE  Pertinent labs & imaging results that were available during my care of the patient were reviewed by me and considered in my medical decision making (see chart for details).  Patient here for depression, states he just needs to be admitted for a few days. It is unclear what if any event has triggered his depression. He does not state SI or HI, but does not deny them either. Patient does admit to alcohol use tonight. Patient came here voluntarily, I do not believe the patient meets involuntary commitment criteria at this time. We will obtain lab work, consult psychiatry, and closely monitor in the emergency department.  ____________________________________________   FINAL CLINICAL IMPRESSION(S) / ED DIAGNOSES  Alcohol intoxication Depression   Minna Antis, MD 10/25/14 [phone]

## 2014-10-25 NOTE — BH Assessment (Signed)
Assessment Note  [name] is an 54 y.o. male presenting to the ED via EMS after becoming intoxicated and stating that he would harm himself by either cutting or jumping in front of a car.  Pt reports consuming 1/2 bottle of beer this morning.  Pt also reports getting into a verbal altercation with store manager at [employer] and states that he wanted to "hurt her".  Pt reports he did not have a specific plan as to how he would hurt her.  Pt also reports difficulty getting along with his roommate at the group home.  He reports not taking his medications and "thinks he missed his last shot".  Axis I: Alcohol Abuse and Schizoaffective Disorder Axis II: Deferred Axis III:  Past Medical History  Diagnosis Date  . HIV (human immunodeficiency virus infection)   . BPH (benign prostatic hypertrophy)   . Dyslipidemia   . Hypertension   . Hypothyroidism   . Schizoaffective disorder    Axis IV: problems related to social environment Axis V: 61-70 mild symptoms  Past Medical History:  Past Medical History  Diagnosis Date  . HIV (human immunodeficiency virus infection)   . BPH (benign prostatic hypertrophy)   . Dyslipidemia   . Hypertension   . Hypothyroidism   . Schizoaffective disorder     History reviewed. No pertinent past surgical history.  Family History: History reviewed. No pertinent family history.  Social History:  reports that he has been smoking Cigarettes.  He has been smoking about 2.00 packs per day. He does not have any smokeless tobacco history on file. He reports that he drinks about 1.2 - 1.8 oz of alcohol per week. He reports that he does not use illicit drugs.  Additional Social History:  Alcohol / Drug Use History of alcohol / drug use?: Yes Longest period of sobriety (when/how long): 4 days Substance #1 Name of Substance 1: ETOH 1 - Age of First Use: Pt reports he was 25 or 54 years old when his grandmother started putting bear in his milk. 1 - Amount  (size/oz): 1 or 2 16 oz [place]kans 1 - Frequency: daily 1 - Duration: once or twice a day 1 - Last Use / Amount: this morning  CIWA: CIWA-Ar BP: 126/88 mmHg Pulse Rate: 86 COWS:    Allergies: No Known Allergies  Home Medications:  (Not in a hospital admission)  OB/GYN Status:  No LMP for male patient.  General Assessment Data Location of Assessment: [facility] ED TTS Assessment: In system Is this a Tele or Face-to-Face Assessment?: Face-to-Face Is this an Initial Assessment or a Re-assessment for this encounter?: Initial Assessment Marital status: Single Maiden name: N/A Is patient pregnant?: No Pregnancy Status: No Living Arrangements: Group Home ([facility]) Can pt return to current living arrangement?: Yes Admission Status: Voluntary Is patient capable of signing voluntary admission?: Yes Referral Source: Self/Family/Friend Insurance type: Medicaid     Crisis Care Plan Living Arrangements: Group Home ([facility]) [facility] of Psychiatrist: [facility] of Therapist: [facility]  Education Status Is patient currently in school?: No Current Grade: N/A Highest grade of school patient has completed: 12th Name of school: [employer]  Risk to self with the past 6 months Suicidal Ideation: Yes-Currently Present Has patient been a risk to self within the past 6 months prior to admission? : No Suicidal Intent: Yes-Currently Present Has patient had any suicidal intent within the past 6 months prior to admission? : No Is patient at  risk for suicide?: Yes Suicidal Plan?: Yes-Currently Present Has patient had any suicidal plan within the past 6 months prior to admission? : No Specify Current Suicidal Plan: Pt planned to either cut himself or jump in front of car. Access to Means: No What has been your use of drugs/alcohol within the last 12 months?: ETOH, 1-2 daily Previous Attempts/Gestures: Yes How many times?: 1 Other Self Harm Risks: None Triggers for Past  Attempts: Other personal contacts Intentional Self Injurious Behavior: None Family Suicide History: Unknown Recent stressful life event(s): Conflict (Comment) (Conflict with roommate and store owner) Persecutory voices/beliefs?: No Depression: No Depression Symptoms: Feeling angry/irritable Substance abuse history and/or treatment for substance abuse?: Yes Suicide prevention information given to non-admitted patients: Not applicable  Risk to Others within the past 6 months Homicidal Ideation: Yes-Currently Present Does patient have any lifetime risk of violence toward others beyond the six months prior to admission? 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[employer], manufacturing History of harm to others?: No Assessment of Violence: On admission Violent Behavior Description: None Does patient have access to weapons?: No Criminal Charges Pending?: No Does patient have a court date: No Is patient on probation?: No  Psychosis Hallucinations: None noted Delusions: None noted  Mental Status Report Appearance/Hygiene: Disheveled, Body odor Eye Contact: Good Motor Activity: Unsteady, Agitation Speech: Slurred, Loud Level of Consciousness: Alert Mood: Irritable, Angry Affect: Irritable, Sullen Anxiety Level: None Thought Processes: Flight of Ideas Judgement: Impaired Orientation: Person, Place, Situation Obsessive Compulsive Thoughts/Behaviors: None  Cognitive Functioning Concentration: Normal Memory: Recent Impaired IQ: Average Insight: Poor Impulse Control: Poor Appetite: Poor Weight Loss: 10 Weight Gain: 0 Sleep: Decreased Total Hours of Sleep: 5 Vegetative Symptoms: None  ADLScreening [facility] Assessment Services) Patient's cognitive ability adequate  to safely complete daily activities?: Yes Patient able to express need for assistance with ADLs?: Yes Independently performs ADLs?: Yes (appropriate for developmental age)  Prior Inpatient Therapy Prior Inpatient Therapy: No  Prior Outpatient Therapy Prior Outpatient Therapy: Yes Prior Therapy Facilty/Provider(s): [facility] Reason for Treatment: mental illness Does patient have an ACCT team?: Yes ([facility]) Does patient have Intensive In-House Services?  : No Does patient have [facility] services? : No Does patient have P4CC services?: No  ADL Screening (condition at time of admission) Patient's cognitive ability adequate to safely complete daily activities?: Yes Patient able to express need for assistance with ADLs?: Yes Independently performs ADLs?: Yes (appropriate for developmental age)       Abuse/Neglect Assessment (Assessment to be complete while patient is alone) Physical Abuse: Denies Verbal Abuse: Denies Sexual Abuse: Denies Exploitation of patient/patient's resources: Denies Self-Neglect: Denies Values / Beliefs Cultural Requests During Hospitalization: None Spiritual Requests During Hospitalization: None Consults Spiritual Care Consult Needed: No Social Work Consult Needed: No Merchant navy officer (For Healthcare) Does patient have an advance directive?: No Would patient like information on creating an advanced directive?: No - patient declined information    Additional Information 1:1 In Past 12 Months?: No CIRT Risk: No Elopement Risk: No     Disposition:  Disposition Initial Assessment Completed for this Encounter: Yes Disposition of Patient: Other dispositions (Psych MD Consult) Other disposition(s): Other (Comment)  On Site Evaluation by:   Reviewed with Physician:    [place] 10/25/2014 6:19 AM

## 2014-10-25 NOTE — ED Notes (Signed)

## 2014-10-25 NOTE — BHH Counselor (Signed)
Several attempts have been made to contact patient's Group Home ([facility]-[phone]) but was unsuccessful. Writer contacted [employer] [phone]) but they advised to contact [place] Police due to the address. Writer called [employer] ([phone]) and they stated, they will have an a officer go to the home. Will have them to call the ER.

## 2014-10-25 NOTE — ED Provider Notes (Signed)
-----------------------------------------   5:17 PM on 10/25/2014 -----------------------------------------   Blood pressure 101/66, pulse 80, temperature 97.4 F (36.3 C), temperature source Oral, resp. rate 18, height  (1.88 m), weight 170 lb (77.111 kg), SpO2 99 %.  The patient had no acute events since last update.  Calm and cooperative at this time.    Patient evaluated by Dr. Toni Amend says that he has appropriate for discharge to his group home. His IVC has been rescinded. Patient is not clinically intoxicated. No suicidal or homicidal ideation.   Myrna Blazer, MD 10/25/14 [phone]

## 2014-10-31 ENCOUNTER — Emergency Department
Admission: EM | Admit: 2014-10-31 | Discharge: 2014-11-01 | Disposition: A | Discharge status: Other Acute Inpatient Hospital | Payer: Medicare Other | Attending: Emergency Medicine | Admitting: Emergency Medicine

## 2014-10-31 DIAGNOSIS — Z79899 Other long term (current) drug therapy: Secondary | ICD-10-CM | POA: Insufficient documentation

## 2014-10-31 DIAGNOSIS — I10 Essential (primary) hypertension: Secondary | ICD-10-CM | POA: Insufficient documentation

## 2014-10-31 DIAGNOSIS — Z7982 Long term (current) use of aspirin: Secondary | ICD-10-CM | POA: Diagnosis not present

## 2014-10-31 DIAGNOSIS — Z720 Tobacco use: Secondary | ICD-10-CM | POA: Diagnosis not present

## 2014-10-31 DIAGNOSIS — F250 Schizoaffective disorder, bipolar type: Secondary | ICD-10-CM | POA: Diagnosis not present

## 2014-10-31 DIAGNOSIS — Z765 Malingerer [conscious simulation]: Secondary | ICD-10-CM | POA: Diagnosis not present

## 2014-10-31 DIAGNOSIS — F911 Conduct disorder, childhood-onset type: Secondary | ICD-10-CM | POA: Diagnosis present

## 2014-10-31 LAB — COMPREHENSIVE METABOLIC PANEL
ALBUMIN: 3.9 g/dL (ref 3.5–5.0)
ALT: 7 U/L — ABNORMAL LOW (ref 17–63)
ANION GAP: 9 (ref 5–15)
AST: 17 U/L (ref 15–41)
Alkaline Phosphatase: 80 U/L (ref 38–126)
BUN: 11 mg/dL (ref 6–20)
CALCIUM: 8.6 mg/dL — AB (ref 8.9–10.3)
CO2: 24 mmol/L (ref 22–32)
Chloride: 105 mmol/L (ref 101–111)
Creatinine, Ser: 0.93 mg/dL (ref 0.61–1.24)
GFR calc Af Amer: >60 mL/min (ref >60)
GFR calc non Af Amer: >60 mL/min (ref >60)
Glucose, Bld: 93 mg/dL (ref 65–99)
Potassium: 3.8 mmol/L (ref 3.5–5.1)
Sodium: 138 mmol/L (ref 135–145)
Total Bilirubin: 0.5 mg/dL (ref 0.3–1.2)
Total Protein: 6.5 g/dL (ref 6.5–8.1)

## 2014-10-31 LAB — URINALYSIS COMPLETE WITH MICROSCOPIC (ARMC ONLY)
BACTERIA UA: NONE SEEN
BILIRUBIN URINE: NEGATIVE
Glucose, UA: NEGATIVE mg/dL
HGB URINE DIPSTICK: NEGATIVE
Ketones, ur: NEGATIVE mg/dL
Leukocytes, UA: NEGATIVE
NITRITE: NEGATIVE
Protein, ur: NEGATIVE mg/dL
SPECIFIC GRAVITY, URINE: 1.021 (ref 1.005–1.030)
pH: 5 (ref 5.0–8.0)

## 2014-10-31 LAB — URINE DRUG SCREEN, QUALITATIVE (ARMC ONLY)
AMPHETAMINES, UR SCREEN: NOT DETECTED
Barbiturates, Ur Screen: NOT DETECTED
Benzodiazepine, Ur Scrn: NOT DETECTED
COCAINE METABOLITE, UR ~~LOC~~: NOT DETECTED
Cannabinoid 50 Ng, Ur ~~LOC~~: NOT DETECTED
MDMA (ECSTASY) UR SCREEN: NOT DETECTED
Methadone Scn, Ur: NOT DETECTED
OPIATE, UR SCREEN: NOT DETECTED
Phencyclidine (PCP) Ur S: NOT DETECTED
Tricyclic, Ur Screen: NOT DETECTED

## 2014-10-31 LAB — CBC
HCT: 40.1 % (ref 40.0–52.0)
Hemoglobin: 13.6 g/dL (ref 13.0–18.0)
MCH: 33.6 pg (ref 26.0–34.0)
MCHC: 33.8 g/dL (ref 32.0–36.0)
MCV: 99.4 fL (ref 80.0–100.0)
PLATELETS: 241 10*3/uL (ref 150–440)
RBC: 4.04 MIL/uL — AB (ref 4.40–5.90)
RDW: 13.4 % (ref 11.5–14.5)
WBC: 5.6 10*3/uL (ref 3.8–10.6)

## 2014-10-31 LAB — ETHANOL: Alcohol, Ethyl (B): <5 mg/dL (ref <5)

## 2014-10-31 NOTE — ED Notes (Signed)

## 2014-10-31 NOTE — ED Notes (Signed)
Patient assigned to appropriate care area. Patient oriented to unit/care area: Informed that, for their safety, care areas are designed for safety and monitored by security cameras at all times; and visiting hours explained to patient. Patient verbalizes understanding, and verbal contract for safety obtained. 

## 2014-10-31 NOTE — ED Notes (Addendum)
Pt beginning to raise voice and yell at staff for not putting a tv near him. Pt informed that you can not come to ED to watch TV. Pt moved to room due to yelling at staff and disrupting other pts.

## 2014-10-31 NOTE — ED Notes (Signed)
Pt reports wanting to de discharged from ACT team. Pt requests his letter be given tot he person who picks him up so it can be given to the ACT team.

## 2014-10-31 NOTE — ED Provider Notes (Signed)
[facility] Emergency Department Provider Note  ____________________________________________  Time seen: 7:45 PM  I have reviewed the triage vital signs and the nursing notes.   HISTORY  Chief Complaint Aggressive Behavior    HPI [name] is a 54 y.o. male who on my examination denies any suicidal ideation and homicidal ideation or hallucinations. He states that he is here because he wants to be admitted to [facility] in [place]. He presents a 3 page letter which consists of one typed pages that he wrote at [employer] requesting to be incarcerated for the next 6-9 months and during that time discharged from the ACT team so that he can receive his full SSDI check and manage his affairs by himself. The second page is a handwritten note discussing that he wants to be in jail and that he has $179 traffic ticket that he refuses to pay. The third pages actually a reprint duplicate of the first page. On the back of that he is written a handwritten note to me stating that this is his 10th time in the emergency department this year and he feels like we should just admitted to [facility].  He denies any acute complaints to me. He has a clear agenda of wanting to be institutionalized either in jail or [facility]. He states that while he is waiting to be admitted to [facility], he would like [place] to put him into behavioral health holding unit and let him watch TV. He requests some dinner at this time. He states that he wants to go to [facility] because he is not happy with his current group home and wants to be transferred to another living situation.     Past Medical History  Diagnosis Date  . HIV (human immunodeficiency virus infection)   . BPH (benign prostatic hypertrophy)   . Dyslipidemia   . Hypertension   . Hypothyroidism   . Schizoaffective disorder     Patient Active Problem List   Diagnosis Date  Noted  . Schizoaffective disorder 10/25/2014  . Alcohol abuse 10/25/2014    No past surgical history on file.  Current Outpatient Rx  Name  Route  Sig  Dispense  Refill  . aspirin 81 MG tablet   Oral   Take 81 mg by mouth daily.         . benztropine (COGENTIN) 1 MG tablet   Oral   Take 1 mg by mouth at bedtime.         . carvedilol (COREG) 3.125 MG tablet   Oral   Take 3.125 mg by mouth 2 (two) times daily with a meal.         . cholecalciferol (VITAMIN D) 1000 UNITS tablet   Oral   Take 2,000 Units by mouth daily.         . divalproex (DEPAKOTE) 500 MG DR tablet   Oral   Take 500 mg by mouth every morning.         . divalproex (DEPAKOTE) 500 MG DR tablet   Oral   Take 1,000 mg by mouth at bedtime.         . docusate sodium (COLACE) 100 MG capsule   Oral   Take 100 mg by mouth daily.         Marland Kitchen efavirenz-emtricitabine-tenofovir (ATRIPLA) 600-200-300 MG per tablet   Oral   Take 1 tablet by mouth at bedtime.         Marland Kitchen  fluPHENAZine (PROLIXIN) 10 MG tablet   Oral   Take 10 mg by mouth at bedtime.         . fluPHENAZine (PROLIXIN) 5 MG tablet   Oral   Take 5 mg by mouth every morning.         Marland Kitchen levothyroxine (SYNTHROID, LEVOTHROID) 25 MCG tablet   Oral   Take 25 mcg by mouth daily before breakfast.         . lithium 600 MG capsule   Oral   Take 600 mg by mouth 2 (two) times daily with a meal.         . paliperidone (INVEGA SUSTENNA) 156 MG/ML SUSP injection   Intramuscular   Inject 156 mg into the muscle every 30 (thirty) days.         . simvastatin (ZOCOR) 20 MG tablet   Oral   Take 20 mg by mouth daily.         . tamsulosin (FLOMAX) 0.4 MG CAPS capsule   Oral   Take 0.4 mg by mouth at bedtime.         Marland Kitchen terazosin (HYTRIN) 1 MG capsule   Oral   Take 1 mg by mouth at bedtime.           Allergies Review of patient's allergies indicates no known allergies.  No family history on file.  Social History Social  History  Substance Use Topics  . Smoking status: Current Every Day Smoker -- 2.00 packs/day    Types: Cigarettes  . Smokeless tobacco: Not on file  . Alcohol Use: 1.2 - 1.8 oz/week    2-3 Cans of beer per week    Review of Systems  Constitutional: No fever or chills. No weight changes Eyes:No blurry vision or double vision.  ENT: No sore throat. Cardiovascular: No chest pain. Respiratory: No dyspnea or cough. Gastrointestinal: Negative for abdominal pain, vomiting and diarrhea.  No BRBPR or melena. Genitourinary: Negative for dysuria, urinary retention, bloody urine, or difficulty urinating. Musculoskeletal: Negative for back pain. No joint swelling or pain. Skin: Negative for rash. Neurological: Negative for headaches, focal weakness or numbness. Psychiatric:No anxiety or depression.   Endocrine:No hot/cold intolerance, changes in energy, or sleep difficulty.  10-point ROS otherwise negative.  ____________________________________________   PHYSICAL EXAM:  VITAL SIGNS: ED Triage Vitals  Enc Vitals Group     BP 10/31/14 1907 124/79 mmHg     Pulse Rate 10/31/14 1907 85     Resp --      Temp 10/31/14 1907 98.5 F (36.9 C)     Temp Source 10/31/14 1907 Oral     SpO2 10/31/14 1907 96 %     Weight 10/31/14 1907 170 lb (77.111 kg)     Height 10/31/14 1907 6' (1.829 m)     Head Cir --      Peak Flow --      Pain Score --      Pain Loc --      Pain Edu? --      Excl. in GC? --      Constitutional: Alert and oriented. Well appearing and in no distress. Eyes: No scleral icterus. No conjunctival pallor. PERRL. EOMI ENT   Head: Normocephalic and atraumatic.   Nose: No congestion/rhinnorhea. No septal hematoma   Mouth/Throat: MMM, no pharyngeal erythema. No peritonsillar mass. No uvula shift.   Neck: No stridor. No SubQ emphysema. No meningismus. Hematological/Lymphatic/Immunilogical: No cervical lymphadenopathy. Cardiovascular: RRR. Normal and symmetric  distal pulses are  present in all extremities. No murmurs, rubs, or gallops. Respiratory: Normal respiratory effort without tachypnea nor retractions. Breath sounds are clear and equal bilaterally. No wheezes/rales/rhonchi. Gastrointestinal: Soft and nontender. No distention. There is no CVA tenderness.  No rebound, rigidity, or guarding. Genitourinary: deferred Musculoskeletal: Nontender with normal range of motion in all extremities. No joint effusions.  No lower extremity tenderness.  No edema. Neurologic:   Normal speech and language.  CN 2-10 normal. Motor grossly intact. No pronator drift.  Normal gait. No gross focal neurologic deficits are appreciated.  Skin:  Skin is warm, dry and intact. No rash noted.  No petechiae, purpura, or bullae. Psychiatric: Mood and affect are normal. Speech and behavior are normal. Patient has linear rational thought, but impaired insight into how he can best pursue his goal of being reassigned to another group home.  ____________________________________________    LABS (pertinent positives/negatives) (all labs ordered are listed, but only abnormal results are displayed) Labs Reviewed  CBC - Abnormal; Notable for the following:    RBC 4.04 (*)    All other components within normal limits  COMPREHENSIVE METABOLIC PANEL - Abnormal; Notable for the following:    Calcium 8.6 (*)    ALT 7 (*)    All other components within normal limits  URINALYSIS COMPLETEWITH MICROSCOPIC ([facility] ONLY) - Abnormal; Notable for the following:    Color, Urine YELLOW (*)    APPearance CLEAR (*)    Squamous Epithelial / LPF 0-5 (*)    All other components within normal limits  ETHANOL  URINE DRUG SCREEN, QUALITATIVE ([facility] ONLY)   ____________________________________________   EKG    ____________________________________________     RADIOLOGY    ____________________________________________   PROCEDURES  ____________________________________________   INITIAL IMPRESSION / ASSESSMENT AND PLAN / ED COURSE  Pertinent labs & imaging results that were available during my care of the patient were reviewed by me and considered in my medical decision making (see chart for details).  Patient presents to the ED in search of new living arrangement. He denies any acute events. He is medically and psychiatrically stable by my evaluation. There is no indication for involuntary commitment or hospitalization at this time. Labs are unremarkable and the patient is not intoxicated or under the influence of any illicit substances. He'll be discharged back to his group home. He was counseled that if he is unhappy at his group home he should work with his ACT team to seek a new living arrangement.  ____________________________________________   FINAL CLINICAL IMPRESSION(S) / ED DIAGNOSES  Final diagnoses:  Schizoaffective disorder, bipolar type  Malingering      Sharman Cheek, MD 10/31/14 2020

## 2014-10-31 NOTE — ED Notes (Signed)

## 2014-10-31 NOTE — Discharge Instructions (Signed)
Schizoaffective Disorder Schizoaffective disorder (ScAD) is a mental illness. It causes symptoms that are a mixture of schizophrenia (a psychotic disorder) and an affective (mood) disorder. The schizophrenic symptoms may include delusions, hallucinations, or odd behavior. The mood symptoms may be similar to major depression or bipolar disorder. ScAD may interfere with personal relationships or normal daily activities. People with ScAD are at increased risk for job loss, social isolation,physical health problems, anxiety and substance use disorders, and suicide. ScAD usually occurs in cycles. Periods of severe symptoms are followed by periods of less severe symptoms or improvement. The illness affects men and women equally but usually appears at an earlier age (teenage or early adult years) in men. People who have family members with schizophrenia, bipolar disorder, or ScAD are at higher risk of developing ScAD. SYMPTOMS  At any one time, people with ScAD may have psychotic symptoms only or both psychotic and mood symptoms. The psychotic symptoms include one or more of the following:  Hearing, seeing, or feeling things that are not there (hallucinations).   Having fixed, false beliefs (delusions). The delusions usually are of being attacked, harassed, cheated, persecuted, or conspired against (paranoid delusions).  Speaking in a way that makes no sense to others (disorganized speech). The psychotic symptoms of ScAD may also include confusing or odd behavior or any of the negative symptoms of schizophrenia. These include loss of motivation for normal daily activities, such as bathing or grooming, withdrawal from other people, and lack of emotions.  The mood symptoms of ScAD occur more often than not. They resemble major depressive disorder or bipolar mania. Symptoms of major depression include depressed mood and four or more of the following:  Loss of interest in usually pleasurable activities  (anhedonia).  Sleeping more or less than normal.  Feeling worthless or excessively guilty.  Lack of energy or motivation.  Trouble concentrating.  Eating more or less than usual.  Thinking a lot about death or suicide. Symptoms of bipolar mania include abnormally elevated or irritable mood and increased energy or activity, plus three or more of the following:   More confidence than normal or feeling that you are able to do anything (grandiosity).  Feeling rested with less sleep than normal.   Being easily distracted.   Talking more than usual or feeling pressured to keep talking.   Feeling that your thoughts are racing.  Engaging in high-risk activities such as buying sprees or foolish business decisions. DIAGNOSIS  ScAD is diagnosed through an assessment by your health care provider. Your health care provider will observe and ask questions about your thoughts, behavior, mood, and ability to function in daily life. Your health care provider may also ask questions about your medical history and use of drugs, including prescription medicines. Your health care provider may also order blood tests and imaging exams. Certain medical conditions and substances can cause symptoms that resemble ScAD. Your health care provider may refer you to a mental health specialist for evaluation.  ScAD is divided into two types. The depressive type is diagnosed if your mood symptoms are limited to major depression. The bipolar type is diagnosed if your mood symptoms are manic or a mixture of manic and depressive symptoms TREATMENT  ScAD is usually a lifelong illness. Long-term treatment is necessary. The following treatments are available:  Medicine. Different types of medicine are used to treat ScAD. The exact combination depends on the type and severity of your symptoms. Antipsychotic medicine is used to control psychotic symptomssuch as delusions, paranoia,   and hallucinations. Mood stabilizers can  even the highs and lows of bipolar manic mood swings. Antidepressant medicines are used to treat major depressive symptoms.  Counseling or talk therapy. Individual, group, or family counseling may be helpful in providing education, support, and guidance. Many people with ScAD also benefit from social skills and job skills (vocational) training. A combination of medicine and counseling is usually best for managing the disorder over time. A procedure in which electricity is applied to the brain through the scalp (electroconvulsive therapy) may be used to treat people with severe manic symptoms that do not respond to medicine and counseling. HOME CARE INSTRUCTIONS   Take all your medicine as prescribed.  Check with your health care provider before starting new prescription or over-the-counter medicines.  Keep all follow up appointments with your health care provider. SEEK MEDICAL CARE IF:   If you are not able to take your medicines as prescribed.  If your symptoms get worse. SEEK IMMEDIATE MEDICAL CARE IF:   You have serious thoughts about hurting yourself or others. Document Released: 07/20/2006 Document Revised: 07/24/2013 Document Reviewed: 10/21/2012 Exit[employer] Patient Information 2015 [employer]. This information is not intended to replace advice given to you by your health care provider. Make sure you discuss any questions you have with your health care provider.  

## 2014-10-31 NOTE — ED Notes (Signed)
Pt arrived to ED with note that was typed at [facility] stating that pt wanted to be taken to jail and be "encarcerated" pt stats in letter that after he gets out of jail he wishes to live in a place of his own. Pt stats that he wants to go the "the hole" in reference to the B[facility]. Pt stats he has received a seat belt ticket from the police and wanted to be locked up until he no longer has to pay the fine. Pt reports having thoughts of walking out in front of traffic due to the ticket. Pt also went to police station today and reported that he would blow up the station if they did not take him to [facility]. Pt is upset upon arrival to [facility] ED. Pt stats he does not like his group home and does not want to go back. Pt requests to call crisis line while in ED to arrange for a living space in 2 weeks.

## 2014-10-31 NOTE — ED Notes (Signed)

## 2014-10-31 NOTE — ED Notes (Signed)
Patient resting comfortably in bed, respirations even and unlabored. NAD at this time. Sitter and ODS officer present. Will continue to monitor. 

## 2014-10-31 NOTE — ED Notes (Signed)
Pt transferred to [facility] by Blenda Mounts, RN.

## 2014-10-31 NOTE — BH Assessment (Signed)
Assessment Note  [name] is an 54 y.o. male presenting to the ED because he does not like his current living situation.  Patient denies any suicidal or homicidal ideations or hallucinations. He states that he is here because he wants to be admitted to [facility] in [place]. He presents a 3 page letter which consists of one typed pages that he wrote at [employer] requesting to be incarcerated for the next 6-9 months and during that time discharged from the ACT team so that he can receive his full SSDI check and manage his affairs by himself.   Axis I: Schizoaffective Disorder Axis II: Deferred Axis III:  Past Medical History  Diagnosis Date  . HIV (human immunodeficiency virus infection)   . BPH (benign prostatic hypertrophy)   . Dyslipidemia   . Hypertension   . Hypothyroidism   . Schizoaffective disorder    Axis IV: housing problems and problems with primary support group Axis V: 61-70 mild symptoms  Past Medical History:  Past Medical History  Diagnosis Date  . HIV (human immunodeficiency virus infection)   . BPH (benign prostatic hypertrophy)   . Dyslipidemia   . Hypertension   . Hypothyroidism   . Schizoaffective disorder     No past surgical history on file.  Family History: No family history on file.  Social History:  reports that he has been smoking Cigarettes.  He has been smoking about 2.00 packs per day. He does not have any smokeless tobacco history on file. He reports that he drinks about 1.2 - 1.8 oz of alcohol per week. He reports that he does not use illicit drugs.  Additional Social History:  Alcohol / Drug Use History of alcohol / drug use?: Yes Substance #1 Name of Substance 1: ETOH 1 - Age of First Use: 5 or 6 1 - Amount (size/oz): 1 can beer 1 - Frequency: daily 1 - Duration: in the morning 1 - Last Use / Amount: couple of days ago  CIWA: CIWA-Ar BP: 124/79 mmHg Pulse Rate: 85 COWS:    Allergies: No Known Allergies  Home  Medications:  (Not in a hospital admission)  OB/GYN Status:  No LMP for male patient.  General Assessment Data Location of Assessment: [facility] ED TTS Assessment: In system Is this a Tele or Face-to-Face Assessment?: Face-to-Face Is this an Initial Assessment or a Re-assessment for this encounter?: Initial Assessment Marital status: Single Maiden name: N/A Is patient pregnant?: No Pregnancy Status: No Living Arrangements: Group Home Can pt return to current living arrangement?: Yes Admission Status: Voluntary Is patient capable of signing voluntary admission?: Yes Referral Source: Self/Family/Friend Insurance type: Medicaid  Medical Screening Exam [facility] Walk-in ONLY) Medical Exam completed: Yes  Crisis Care Plan Living Arrangements: Group Home [facility] Psychiatrist: [facility] of Therapist: [facility]  Education Status Is patient currently in school?: No Current Grade: N/A Highest grade of school patient has completed: 12th Name of school: [employer] person: N/A  Risk to self with the past 6 months Suicidal Ideation: Yes-Currently Present Has patient been a risk to self within the past 6 months prior to admission? : No Suicidal Intent: No Has patient had any suicidal intent within the past 6 months prior to admission? : No Is patient at risk for suicide?: No Suicidal Plan?: Yes-Currently Present Has patient had any suicidal plan within the past 6 months prior to admission? : Yes Specify Current Suicidal Plan: Pt reports he planned to jump out in front of  car. Access to Means: No What has been your use of drugs/alcohol within the last 12 months?: ETOH, 1-2 daily Previous Attempts/Gestures: Yes How many times?: 1 Other Self Harm Risks: None Triggers for Past Attempts: Other personal contacts Intentional Self Injurious Behavior: None Family Suicide History: Unknown Recent stressful life event(s): Conflict (Comment) (conflict with roommates) Persecutory  voices/beliefs?: No Depression Symptoms: Feeling angry/irritable Substance abuse history and/or treatment for substance abuse?: Yes Suicide prevention information given to non-admitted patients: Not applicable  Risk to Others within the past 6 months Homicidal Ideation: No Does patient have any lifetime risk of violence toward others beyond the six months prior to admission? : No Thoughts of Harm to Others: No Comment - Thoughts of Harm to Others: N/A Current Homicidal Intent: No Current Homicidal Plan: No Access to Homicidal Means: No Identified Victim: N/A History of harm to others?: No Assessment of Violence: On admission Violent Behavior Description: None Does patient have access to weapons?: No Criminal Charges Pending?: No Does patient have a court date: No Is patient on probation?: No  Psychosis Hallucinations: None noted Delusions: None noted  Mental Status Report Appearance/Hygiene: In scrubs Eye Contact: Good Motor Activity: Freedom of movement Speech: Logical/coherent Level of Consciousness: Alert Mood: Irritable Affect: Irritable, Sullen Anxiety Level: None Thought Processes: Relevant Judgement: Partial Orientation: Person, Place, Situation Obsessive Compulsive Thoughts/Behaviors: None  Cognitive Functioning Concentration: Normal Memory: Recent Intact IQ: Average Insight: Fair Impulse Control: Fair Appetite: Good Weight Loss: 0 Weight Gain: 0 Sleep: No Change Total Hours of Sleep: 5 Vegetative Symptoms: None  ADLScreening [facility] Assessment Services) Patient's cognitive ability adequate to safely complete daily activities?: Yes Patient able to express need for assistance with ADLs?: Yes Independently performs ADLs?: Yes (appropriate for developmental age)  Prior Inpatient Therapy Prior Inpatient Therapy: No  Prior Outpatient Therapy Prior Outpatient Therapy: Yes Prior Therapy Facilty/Provider(s): [facility] Reason for Treatment: mental illness Does  patient have an ACCT team?: Yes Does patient have Intensive In-House Services?  : No Does patient have [facility] services? : No Does patient have P4CC services?: No  ADL Screening (condition at time of admission) Patient's cognitive ability adequate to safely complete daily activities?: Yes Patient able to express need for assistance with ADLs?: Yes Independently performs ADLs?: Yes (appropriate for developmental age)       Abuse/Neglect Assessment (Assessment to be complete while patient is alone) Physical Abuse: Denies Verbal Abuse: Denies Sexual Abuse: Denies Exploitation of patient/patient's resources: Denies Self-Neglect: Denies Values / Beliefs Cultural Requests During Hospitalization: None Spiritual Requests During Hospitalization: None Consults Spiritual Care Consult Needed: No Social Work Consult Needed: No      Additional Information 1:1 In Past 12 Months?: No CIRT Risk: No Elopement Risk: No Does patient have medical clearance?: Yes     Disposition:  Disposition Initial Assessment Completed for this Encounter: Yes Disposition of Patient: Other dispositions Other disposition(s): Other (Comment) (Discharge back to group home.  )  On Site Evaluation by:   Reviewed with Physician:    [place] 10/31/2014 9:29 PM

## 2014-10-31 NOTE — ED Notes (Signed)
[employer] Officer Adriana Simas went to group home and spoke with staff. Group home staff reported they would not be able to pick patient up tonight but will be available to pick patient up tomorrow. Patient informed he will be staying in hospital and moved to ED BHU.

## 2014-10-31 NOTE — ED Notes (Signed)
Feeling suicidal today, wants to jump in front of a car.  Pt is from group home states not happy there.

## 2014-11-01 NOTE — ED Notes (Signed)
Patient observed lying in bed with eyes closed  Even, unlabored respirations observed   NAD pt appears to be sleeping  I will continue to monitor along with every 15 minute visual observations and ongoing security camera monitoring    

## 2014-11-01 NOTE — ED Notes (Signed)
ENVIRONMENTAL ASSESSMENT Potentially harmful objects out of patient reach: Yes.   Personal belongings secured: Yes.   Patient dressed in hospital provided attire only: Yes.   Plastic bags out of patient reach: Yes.   Patient care equipment (cords, cables, call bells, lines, and drains) shortened, removed, or accounted for: Yes.   Equipment and supplies removed from bottom of stretcher: Yes.   Potentially toxic materials out of patient reach: Yes.   Sharps container removed or out of patient reach: Yes.     BEHAVIORAL HEALTH ROUNDING Patient sleeping: No. Patient alert and oriented: yes Behavior appropriate: Yes.  ; If no, describe:  Nutrition and fluids offered: yes Toileting and hygiene offered: Yes  Sitter present: q15 minute observations and security camera monitoring Law enforcement present: Yes  ODS  

## 2014-11-01 NOTE — ED Notes (Signed)
ED BHU PLACEMENT JUSTIFICATION Is the patient under IVC or is there intent for IVC: Yes.   Is the patient medically cleared: Yes.   Is there vacancy in the ED BHU: Yes.   Is the population mix appropriate for patient: Yes.   Is the patient awaiting placement in inpatient or outpatient setting: Yes.  Return to group home Has the patient had a psychiatric consult: Yes.   Survey of unit performed for contraband, proper placement and condition of furniture, tampering with fixtures in bathroom, shower, and each patient room: Yes.  ; Findings:  APPEARANCE/BEHAVIOR Calm and cooperative NEURO ASSESSMENT Orientation: oriented x3  Denies pain Hallucinations: No.None noted (Hallucinations) Speech: Normal Gait: normal RESPIRATORY ASSESSMENT Even  Unlabored respirations  CARDIOVASCULAR ASSESSMENT Pulses equal   regular rate  Skin warm and dry   GASTROINTESTINAL ASSESSMENT no GI complaint EXTREMITIES Full ROM  PLAN OF CARE Provide calm/safe environment. Vital signs assessed twice daily. ED BHU Assessment once each 12-hour shift. Collaborate with intake RN daily or as condition indicates. Assure the ED provider has rounded once each shift. Provide and encourage hygiene. Provide redirection as needed. Assess for escalating behavior; address immediately and inform ED provider.  Assess family dynamic and appropriateness for visitation as needed: Yes.  ; If necessary, describe findings:  Educate the patient/family about BHU procedures/visitation: Yes.  ; If necessary, describe findings:

## 2014-11-01 NOTE — ED Notes (Signed)
Received a call from stat desk  - group home is here to transport him home  - I awakened pt and provided him a blue scrub top to change into  - pt belongings are all in his bookbag and I will not allow it onto the unit  Pt agrees

## 2014-11-01 NOTE — ED Notes (Signed)
Pt and I ambulated to the lobby - group home staff is sitting in the [place] at the ED entrance - we walked out and I reviewed the discharge diagnosis with the pt and group home staff - explaining that his behaviors and diagnosis are not new  Staff states "Where is his new prescriptions?"  I stated no new meds - she was on the cellphone and stated  - "no new meds - I am not surprised."  Explained to pt and staff that no new medications were ordered and that he should continue all meds listed on discharge paperwork and try to be compliant   Pt verbalized agreement  Group home staff rolled her eyes  Pt secured in the middle row passenger seat

## 2014-11-01 NOTE — ED Notes (Signed)
BEHAVIORAL HEALTH ROUNDING  Patient sleeping: Yes.  Patient alert and oriented: Pt is sleeping.  Behavior appropriate: Yes. ; If no, describe: Pt is sleeping.  Nutrition and fluids offered: No  Toileting and hygiene offered: No  Sitter present: yes  Law enforcement present: Yes   

## 2014-11-01 NOTE — ED Notes (Signed)
Pt given breakfast tray

## 2014-11-01 NOTE — ED Notes (Addendum)
He has taken a shower and he is now lying in bed with his head covered by the blanket  He appears to be sleeping     EDT Pam changed all linens

## 2014-11-01 NOTE — ED Provider Notes (Signed)
-----------------------------------------   7:13 AM on 11/01/2014 -----------------------------------------  No events overnight. Group home staff was unable to pick patient up overnight; thus discharge was held and patient was moved to [facility]. Group home will transport patient in the morning.  Irean Hong, MD 11/01/14 [phone]

## 2014-11-01 NOTE — ED Notes (Signed)
Breakfast provided   Pt has wet the bed - sheets are soaked  I encouraged him to get up and go to the BR each time he feels the need or every couple of hours   Pt verbalized understanding  VSS     Assessment completed  He denies pain

## 2014-11-16 ENCOUNTER — Emergency Department
Admission: EM | Admit: 2014-11-16 | Discharge: 2014-11-16 | Disposition: A | Discharge status: Home / Self Care | Payer: Medicare Other | Attending: Emergency Medicine | Admitting: Emergency Medicine

## 2014-11-16 ENCOUNTER — Encounter: Payer: Self-pay | Admitting: Emergency Medicine

## 2014-11-16 DIAGNOSIS — Z79899 Other long term (current) drug therapy: Secondary | ICD-10-CM | POA: Diagnosis not present

## 2014-11-16 DIAGNOSIS — F259 Schizoaffective disorder, unspecified: Secondary | ICD-10-CM | POA: Insufficient documentation

## 2014-11-16 DIAGNOSIS — R451 Restlessness and agitation: Secondary | ICD-10-CM | POA: Diagnosis present

## 2014-11-16 DIAGNOSIS — I10 Essential (primary) hypertension: Secondary | ICD-10-CM | POA: Insufficient documentation

## 2014-11-16 DIAGNOSIS — Z720 Tobacco use: Secondary | ICD-10-CM | POA: Insufficient documentation

## 2014-11-16 DIAGNOSIS — Z7982 Long term (current) use of aspirin: Secondary | ICD-10-CM | POA: Diagnosis not present

## 2014-11-16 LAB — CBC
HEMATOCRIT: 41.3 % (ref 40.0–52.0)
Hemoglobin: 13.8 g/dL (ref 13.0–18.0)
MCH: 33 pg (ref 26.0–34.0)
MCHC: 33.5 g/dL (ref 32.0–36.0)
MCV: 98.6 fL (ref 80.0–100.0)
Platelets: 181 10*3/uL (ref 150–440)
RBC: 4.19 MIL/uL — AB (ref 4.40–5.90)
RDW: 13 % (ref 11.5–14.5)
WBC: 4.4 10*3/uL (ref 3.8–10.6)

## 2014-11-16 LAB — COMPREHENSIVE METABOLIC PANEL
ALT: 6 U/L — AB (ref 17–63)
AST: 16 U/L (ref 15–41)
Albumin: 4 g/dL (ref 3.5–5.0)
Alkaline Phosphatase: 78 U/L (ref 38–126)
Anion gap: 5 (ref 5–15)
BUN: 9 mg/dL (ref 6–20)
CHLORIDE: 108 mmol/L (ref 101–111)
CO2: 25 mmol/L (ref 22–32)
Calcium: 8.7 mg/dL — ABNORMAL LOW (ref 8.9–10.3)
Creatinine, Ser: 1.03 mg/dL (ref 0.61–1.24)
GFR calc Af Amer: >60 mL/min (ref >60)
GLUCOSE: 98 mg/dL (ref 65–99)
Potassium: 3.6 mmol/L (ref 3.5–5.1)
Sodium: 138 mmol/L (ref 135–145)
TOTAL PROTEIN: 6.7 g/dL (ref 6.5–8.1)
Total Bilirubin: 0.5 mg/dL (ref 0.3–1.2)

## 2014-11-16 LAB — ACETAMINOPHEN LEVEL: Acetaminophen (Tylenol), Serum: <10 ug/mL — ABNORMAL LOW (ref 10–30)

## 2014-11-16 LAB — URINE DRUG SCREEN, QUALITATIVE (ARMC ONLY)
AMPHETAMINES, UR SCREEN: NOT DETECTED
Barbiturates, Ur Screen: NOT DETECTED
Benzodiazepine, Ur Scrn: NOT DETECTED
CANNABINOID 50 NG, UR ~~LOC~~: NOT DETECTED
Cocaine Metabolite,Ur ~~LOC~~: NOT DETECTED
MDMA (ECSTASY) UR SCREEN: NOT DETECTED
Methadone Scn, Ur: NOT DETECTED
OPIATE, UR SCREEN: NOT DETECTED
PHENCYCLIDINE (PCP) UR S: NOT DETECTED
Tricyclic, Ur Screen: NOT DETECTED

## 2014-11-16 LAB — ETHANOL: Alcohol, Ethyl (B): <5 mg/dL (ref <5)

## 2014-11-16 LAB — SALICYLATE LEVEL: Salicylate Lvl: <4 mg/dL (ref 2.8–30.0)

## 2014-11-16 NOTE — Discharge Instructions (Signed)
Schizoaffective Disorder Schizoaffective disorder (ScAD) is a mental illness. It causes symptoms that are a mixture of schizophrenia (a psychotic disorder) and an affective (mood) disorder. The schizophrenic symptoms may include delusions, hallucinations, or odd behavior. The mood symptoms may be similar to major depression or bipolar disorder. ScAD may interfere with personal relationships or normal daily activities. People with ScAD are at increased risk for job loss, social isolation,physical health problems, anxiety and substance use disorders, and suicide. ScAD usually occurs in cycles. Periods of severe symptoms are followed by periods of less severe symptoms or improvement. The illness affects men and women equally but usually appears at an earlier age (teenage or early adult years) in men. People who have family members with schizophrenia, bipolar disorder, or ScAD are at higher risk of developing ScAD. SYMPTOMS  At any one time, people with ScAD may have psychotic symptoms only or both psychotic and mood symptoms. The psychotic symptoms include one or more of the following:  Hearing, seeing, or feeling things that are not there (hallucinations).   Having fixed, false beliefs (delusions). The delusions usually are of being attacked, harassed, cheated, persecuted, or conspired against (paranoid delusions).  Speaking in a way that makes no sense to others (disorganized speech). The psychotic symptoms of ScAD may also include confusing or odd behavior or any of the negative symptoms of schizophrenia. These include loss of motivation for normal daily activities, such as bathing or grooming, withdrawal from other people, and lack of emotions.  The mood symptoms of ScAD occur more often than not. They resemble major depressive disorder or bipolar mania. Symptoms of major depression include depressed mood and four or more of the following:  Loss of interest in usually pleasurable activities  (anhedonia).  Sleeping more or less than normal.  Feeling worthless or excessively guilty.  Lack of energy or motivation.  Trouble concentrating.  Eating more or less than usual.  Thinking a lot about death or suicide. Symptoms of bipolar mania include abnormally elevated or irritable mood and increased energy or activity, plus three or more of the following:   More confidence than normal or feeling that you are able to do anything (grandiosity).  Feeling rested with less sleep than normal.   Being easily distracted.   Talking more than usual or feeling pressured to keep talking.   Feeling that your thoughts are racing.  Engaging in high-risk activities such as buying sprees or foolish business decisions. DIAGNOSIS  ScAD is diagnosed through an assessment by your health care provider. Your health care provider will observe and ask questions about your thoughts, behavior, mood, and ability to function in daily life. Your health care provider may also ask questions about your medical history and use of drugs, including prescription medicines. Your health care provider may also order blood tests and imaging exams. Certain medical conditions and substances can cause symptoms that resemble ScAD. Your health care provider may refer you to a mental health specialist for evaluation.  ScAD is divided into two types. The depressive type is diagnosed if your mood symptoms are limited to major depression. The bipolar type is diagnosed if your mood symptoms are manic or a mixture of manic and depressive symptoms TREATMENT  ScAD is usually a lifelong illness. Long-term treatment is necessary. The following treatments are available:  Medicine. Different types of medicine are used to treat ScAD. The exact combination depends on the type and severity of your symptoms. Antipsychotic medicine is used to control psychotic symptomssuch as delusions, paranoia,   and hallucinations. Mood stabilizers can  even the highs and lows of bipolar manic mood swings. Antidepressant medicines are used to treat major depressive symptoms.  Counseling or talk therapy. Individual, group, or family counseling may be helpful in providing education, support, and guidance. Many people with ScAD also benefit from social skills and job skills (vocational) training. A combination of medicine and counseling is usually best for managing the disorder over time. A procedure in which electricity is applied to the brain through the scalp (electroconvulsive therapy) may be used to treat people with severe manic symptoms that do not respond to medicine and counseling. HOME CARE INSTRUCTIONS   Take all your medicine as prescribed.  Check with your health care provider before starting new prescription or over-the-counter medicines.  Keep all follow up appointments with your health care provider. SEEK MEDICAL CARE IF:   If you are not able to take your medicines as prescribed.  If your symptoms get worse. SEEK IMMEDIATE MEDICAL CARE IF:   You have serious thoughts about hurting yourself or others. Document Released: 07/20/2006 Document Revised: 07/24/2013 Document Reviewed: 10/21/2012 Exit[employer] Patient Information 2015 [employer]. This information is not intended to replace advice given to you by your health care provider. Make sure you discuss any questions you have with your health care provider.  

## 2014-11-16 NOTE — ED Notes (Signed)
Pt given supper tray and sprite 

## 2014-11-16 NOTE — ED Notes (Signed)
BEHAVIORAL HEALTH ROUNDING Patient sleeping: No Patient alert and oriented: Yes Behavior appropriate: Yes Describe behavior: No inappropriate or unacceptable behaviors noted at this time.  Nutrition and fluids offered: No Toileting and hygiene offered: No Sitter present: Behavioral tech rounding every 15 minutes on patient to ensure safety.  Law enforcement present: Yes Patent examiner agency: [employer] ([employer]

## 2014-11-16 NOTE — ED Notes (Signed)
Patient resting comfortably in room. No complaints or concerns voiced. No distress or abnormal behavior noted. Will continue to monitor with security cameras. Q 15 minute rounds continue. 

## 2014-11-16 NOTE — ED Notes (Signed)
ENVIRONMENTAL ASSESSMENT Potentially harmful objects out of patient reach: Yes Personal belongings secured: Yes Patient dressed in hospital provided attire only: Yes Plastic bags out of patient reach: Yes Patient care equipment (cords, cables, call bells, lines, and drains) shortened, removed, or accounted for: Yes Equipment and supplies removed from bottom of stretcher: N/A Potentially toxic materials out of patient reach: Yes Sharps container removed or out of patient reach: Yes

## 2014-11-16 NOTE — ED Notes (Signed)
Pt given clothes for D/C.

## 2014-11-16 NOTE — ED Notes (Signed)

## 2014-11-16 NOTE — ED Notes (Signed)
Pt brought to the ER tonight by the [employer] after pt walked up to the [employer] prec[place] and gave them a note stating such things as "this is how bad I want to get off the streets tonight" "I will bust someone across the head with a brick". "Then I will cut my wrist with a razor" "Pt states in note that he has a seatbelt ticket that he does not want to pay. Pt also requests in the note to "go to [facility] in [place], no where I have never lived before".  Pt denies Visual Hallucinations. Pt does report SI, HI and Auditory Hallucinations that tell him "to be destructive". Pt also reports feeling depressed. Pt reports he is supposed to be on medication but does not take it. Pt reports drinking 1 or 2 beers a day, but denies illegal drug use. Pt does report stressors of being homeless and needing a job.

## 2014-11-16 NOTE — BHH Counselor (Signed)
Called [facility] about discharging plan and a time for a pick up. They referred to the AG team. Called the [phone]). The AG will work with the [facility] to discuss pick for Pt.

## 2014-11-16 NOTE — ED Notes (Signed)
Pt in room. No complaints or concerns voiced at this time. No abnormal behavior noted at this time. Will continue to monitor with q15 min checks. [employer] officer in area. 

## 2014-11-16 NOTE — ED Notes (Signed)
BEHAVIORAL HEALTH ROUNDING Patient sleeping: Yes.   Patient alert and oriented: not applicable Behavior appropriate: Yes.   Nutrition and fluids offered: no, pt sleeping Toileting and hygiene offered: No, pt sleeping Sitter present: yes Law enforcement present: Yes   ENVIRONMENTAL ASSESSMENT Potentially harmful objects out of patient reach: Yes.   Personal belongings secured: Yes.   Patient dressed in hospital provided attire only: Yes.   Plastic bags out of patient reach: Yes.   Patient care equipment (cords, cables, call bells, lines, and drains) shortened, removed, or accounted for: Yes.   Equipment and supplies removed from bottom of stretcher: Yes.   Potentially toxic materials out of patient reach: Yes.   Sharps container removed or out of patient reach: Yes.

## 2014-11-16 NOTE — ED Notes (Signed)
Pt scrubs changed, bed linens changed, pt urinated on himself and bed, pt in room in no distress

## 2014-11-16 NOTE — ED Notes (Signed)
Patient discharged ambulatory to ACT team. He denies SI or HI. Discharge instructions given to ACT team member. Patient received all personal belongings.

## 2014-11-16 NOTE — ED Notes (Signed)
Patient assigned to appropriate care area. Patient oriented to unit/care area: Informed that, for their safety, care areas are designed for safety and monitored by security cameras at all times; and visiting hours explained to patient. Patient verbalizes understanding, and verbal contract for safety obtained.  BEHAVIORAL HEALTH ROUNDING Patient sleeping: No. Patient alert and oriented: yes, pt oriented to self only Behavior appropriate: Yes.   Nutrition and fluids offered: Yes  Toileting and hygiene offered: Yes  Sitter present: q15 min observations Law enforcement present: Yes [employer]  ENVIRONMENTAL ASSESSMENT Potentially harmful objects out of patient reach: Yes.   Personal belongings secured: Yes.   Patient dressed in hospital provided attire only: Yes.   Plastic bags out of patient reach: Yes.   Patient care equipment (cords, cables, call bells, lines, and drains) shortened, removed, or accounted for: Yes.   Equipment and supplies removed from bottom of stretcher: Yes.   Potentially toxic materials out of patient reach: Yes.   Sharps container removed or out of patient reach: Yes.   

## 2014-11-16 NOTE — ED Notes (Signed)
Pt ate 100% of breakfast.

## 2014-11-16 NOTE — BH Assessment (Signed)
Assessment Note  [name] is an 54 y.o. male who reports to [facility] ED voluntarily by the [employer]. Pt wrote a letter stating he, "I want to get off the street's, tonight. I will bust someone across the head with a brick. Then, cut my wrist with a razor."  Pt reports he is unhappy with his current living situation at a [facility] because he doesn't like the people there and they get on his nerves. He states he wants to go to ,"[facility] because he's never been there and he's already been to [place]."  Pt. Reports he has a SI plan by cutting himself with a razor and has voices telling him to bust someone in the head with a brick. He is vague on both plans. Pt. Has an AG team through [facility] that works with him.    Axis I: Schizoaffective Disorder  Past Medical History:  Past Medical History  Diagnosis Date  . HIV (human immunodeficiency virus infection)   . BPH (benign prostatic hypertrophy)   . Dyslipidemia   . Hypertension   . Hypothyroidism   . Schizoaffective disorder     History reviewed. No pertinent past surgical history.  Family History: History reviewed. No pertinent family history.  Social History:  reports that he has been smoking Cigarettes.  He has been smoking about 2.00 packs per day. He does not have any smokeless tobacco history on file. He reports that he drinks about 1.2 - 1.8 oz of alcohol per week. He reports that he does not use illicit drugs.  Additional Social History:  Alcohol / Drug Use Pain Medications: None Reported Prescriptions: None Reported Over the Counter: None Reported History of alcohol / drug use?: Yes Longest period of sobriety (when/how long): a couple of days Substance #1 Name of Substance 1: Alcohol 1 - Age of First Use: Adolescent 1 - Amount (size/oz): 1 to 2 beers 1 - Frequency: a few times a week 1 - Duration: 30 years 1 - Last Use / Amount: 11/13/14  CIWA: CIWA-Ar BP: 132/84 mmHg Pulse Rate: 76 COWS:    Allergies: No Known  Allergies  Home Medications:  (Not in a hospital admission)  OB/GYN Status:  No LMP for male patient.  General Assessment Data Location of Assessment: [facility] ED TTS Assessment: In system Is this a Tele or Face-to-Face Assessment?: Face-to-Face Is this an Initial Assessment or a Re-assessment for this encounter?: Initial Assessment Marital status: Single Maiden name: NA Is patient pregnant?: No Pregnancy Status: No Living Arrangements: Group Home ([facility]) Can pt return to current living arrangement?: Yes Admission Status: Voluntary Is patient capable of signing voluntary admission?: Yes Referral Source: Self/Family/Friend Insurance type: Medicare  Medical Screening Exam [facility] Walk-in ONLY) Medical Exam completed: Yes  Crisis Care Plan Living Arrangements: Group Home ([facility]) Name of Psychiatrist: [facility] Name of Therapist: [facility]  Education Status Is patient currently in school?: No Current Grade: NA Highest grade of school patient has completed: 12th Name of school: NA Contact person: NA  Risk to self with the past 6 months Suicidal Ideation: Yes-Currently Present Has patient been a risk to self within the past 6 months prior to admission? : No Suicidal Intent: No Has patient had any suicidal intent within the past 6 months prior to admission? : Yes Is patient at risk for suicide?: No Suicidal Plan?: Yes-Currently Present Has patient had any suicidal plan within the past 6 months prior to admission? : Yes Specify Current Suicidal Plan:  Pt. reports cutting wrist with a razor as means of suicide Access to Means: Yes Specify Access to Suicidal Means: Pt. reports having a razor What has been your use of drugs/alcohol within the last 12 months?: ETOH, 1 to 2 beers, a couple times a week Previous Attempts/Gestures: Yes How many times?: 1 Other Self Harm Risks: None Triggers for Past Attempts: Other personal contacts Intentional Self Injurious Behavior:  Cutting (Pt. reports he will cut with a razor) Comment - Self Injurious Behavior: Pt. reports he will cut with a razor Family Suicide History: Unknown Recent stressful life event(s): Conflict (Comment), Financial Problems (People at group home ) Persecutory voices/beliefs?: Yes (Pt reports hearing voices to tell him hit ppl with a brick) Depression: No Depression Symptoms: Fatigue, Tearfulness Substance abuse history and/or treatment for substance abuse?: Yes Suicide prevention information given to non-admitted patients: Not applicable  Risk to Others within the past 6 months Homicidal Ideation: Yes-Currently Present Does patient have any lifetime risk of violence toward others beyond the six months prior to admission? : No Thoughts of Harm to Others: Yes-Currently Present Comment - Thoughts of Harm to Others: Pt reports voices telling him to hit ppl with a brick Current Homicidal Intent: No Current Homicidal Plan: Yes-Currently Present Describe Current Homicidal Plan: Pt reports voices telling him to hit ppl with a brick Access to Homicidal Means: No Identified Victim: Pt. was vague, "everyone" History of harm to others?: No Assessment of Violence: None Noted Violent Behavior Description: None Does patient have access to weapons?: No Criminal Charges Pending?: Yes Describe Pending Criminal Charges: Seatbelt Ticket Does patient have a court date: Yes Court Date: 12/21/14 Is patient on probation?: No  Psychosis Hallucinations: Auditory (Pt reports voices telling him to hit ppl with a brick) Delusions: None noted  Mental Status Report Appearance/Hygiene: Unremarkable Eye Contact: Poor Motor Activity: Unremarkable Speech: Soft, Logical/coherent Level of Consciousness: Drowsy Mood: Helpless Affect: Sad Anxiety Level: None Thought Processes: Relevant, Coherent Judgement: Partial Orientation: Person, Place, Time, Situation, Appropriate for developmental age Obsessive  Compulsive Thoughts/Behaviors: None  Cognitive Functioning Concentration: Normal Memory: Recent Intact, Remote Intact IQ: Average Insight: Fair Impulse Control: Fair Appetite: Fair Weight Loss: 0 Weight Gain: 0 Sleep: No Change Total Hours of Sleep: 12 Vegetative Symptoms: None  ADLScreening [facility] Assessment Services) Patient's cognitive ability adequate to safely complete daily activities?: Yes Patient able to express need for assistance with ADLs?: Yes Independently performs ADLs?: Yes (appropriate for developmental age)  Prior Inpatient Therapy Prior Inpatient Therapy: No  Prior Outpatient Therapy Prior Outpatient Therapy: Yes Prior Therapy Facilty/Provider(s): [facility] Reason for Treatment: mental illness Does patient have an ACCT team?: Yes ([facility]) Does patient have Intensive In-House Services?  : No Does patient have [facility] services? : No Does patient have P4CC services?: No  ADL Screening (condition at time of admission) Patient's cognitive ability adequate to safely complete daily activities?: Yes Patient able to express need for assistance with ADLs?: Yes Independently performs ADLs?: Yes (appropriate for developmental age)       Abuse/Neglect Assessment (Assessment to be complete while patient is alone) Physical Abuse: Denies Verbal Abuse: Denies Sexual Abuse: Denies Exploitation of patient/patient's resources: Denies Self-Neglect: Denies Values / Beliefs Cultural Requests During Hospitalization: None Spiritual Requests During Hospitalization: None   Advance Directives (For Healthcare) Does patient have an advance directive?: No    Additional Information 1:1 In Past 12 Months?: No CIRT Risk: No Elopement Risk: No Does patient have medical clearance?: Yes     Disposition:  Disposition  Initial Assessment Completed for this Encounter: Yes Disposition of Patient: Other dispositions Other disposition(s): Other (Comment) (Discharge back to a group  home)  On Site Evaluation by:   Reviewed with Physician:    Ramon Dredge Dayzha Pogosyan 11/16/2014 9:46 AM

## 2014-11-16 NOTE — ED Provider Notes (Signed)
[facility] Emergency Department Provider Note  ____________________________________________  Time seen:   I have reviewed the triage vital signs and the nursing notes.   HISTORY  Chief Complaint Agitation      HPI [name] is a 54 y.o. male presents via [place]. Patient once the police department and gave him a note stating that he wanted to get off the streets and would "bust someone across the head with a brick" to do so./ Patient denied any suicidal homicidal ideations at this time. Patient states he was just hungry and wanted to get off the streets.     Past Medical History  Diagnosis Date  . HIV (human immunodeficiency virus infection)   . BPH (benign prostatic hypertrophy)   . Dyslipidemia   . Hypertension   . Hypothyroidism   . Schizoaffective disorder     Patient Active Problem List   Diagnosis Date Noted  . Schizoaffective disorder 10/25/2014  . Alcohol abuse 10/25/2014    History reviewed. No pertinent past surgical history.  Current Outpatient Rx  Name  Route  Sig  Dispense  Refill  . aspirin 81 MG tablet   Oral   Take 81 mg by mouth daily.         . benztropine (COGENTIN) 1 MG tablet   Oral   Take 1 mg by mouth at bedtime.         . carvedilol (COREG) 3.125 MG tablet   Oral   Take 3.125 mg by mouth 2 (two) times daily with a meal.         . cholecalciferol (VITAMIN D) 1000 UNITS tablet   Oral   Take 2,000 Units by mouth daily.         . divalproex (DEPAKOTE) 500 MG DR tablet   Oral   Take 500 mg by mouth every morning.         . divalproex (DEPAKOTE) 500 MG DR tablet   Oral   Take 1,000 mg by mouth at bedtime.         . docusate sodium (COLACE) 100 MG capsule   Oral   Take 100 mg by mouth daily.         Marland Kitchen efavirenz-emtricitabine-tenofovir (ATRIPLA) 600-200-300 MG per tablet   Oral   Take 1 tablet by mouth at bedtime.         . fluPHENAZine (PROLIXIN) 10 MG tablet   Oral   Take 10  mg by mouth at bedtime.         . fluPHENAZine (PROLIXIN) 5 MG tablet   Oral   Take 5 mg by mouth every morning.         Marland Kitchen levothyroxine (SYNTHROID, LEVOTHROID) 25 MCG tablet   Oral   Take 25 mcg by mouth daily before breakfast.         . lithium 600 MG capsule   Oral   Take 600 mg by mouth 2 (two) times daily with a meal.         . paliperidone (INVEGA SUSTENNA) 156 MG/ML SUSP injection   Intramuscular   Inject 156 mg into the muscle every 30 (thirty) days.         . simvastatin (ZOCOR) 20 MG tablet   Oral   Take 20 mg by mouth daily.         . tamsulosin (FLOMAX) 0.4 MG CAPS capsule   Oral   Take 0.4 mg by mouth at bedtime.         Marland Kitchen  terazosin (HYTRIN) 1 MG capsule   Oral   Take 1 mg by mouth at bedtime.           Allergies Review of patient's allergies indicates no known allergies.  History reviewed. No pertinent family history.  Social History Social History  Substance Use Topics  . Smoking status: Current Every Day Smoker -- 2.00 packs/day    Types: Cigarettes  . Smokeless tobacco: None  . Alcohol Use: 1.2 - 1.8 oz/week    2-3 Cans of beer per week    Review of Systems  Constitutional: Negative for fever. Eyes: Negative for visual changes. ENT: Negative for sore throat. Cardiovascular: Negative for chest pain. Respiratory: Negative for shortness of breath. Gastrointestinal: Negative for abdominal pain, vomiting and diarrhea. Genitourinary: Negative for dysuria. Musculoskeletal: Negative for back pain. Skin: Negative for rash. Neurological: Negative for headaches, focal weakness or numbness. Psychiatric: Homicidal ideation 10-point ROS otherwise negative.  ____________________________________________   PHYSICAL EXAM:  VITAL SIGNS: ED Triage Vitals  Enc Vitals Group     BP 11/16/14 0226 126/90 mmHg     Pulse Rate 11/16/14 0226 78     Resp 11/16/14 0226 18     Temp 11/16/14 0226 97.7 F (36.5 C)     Temp Source 11/16/14 0226  Oral     SpO2 11/16/14 0226 96 %     Weight 11/16/14 0226 165 lb (74.844 kg)     Height 11/16/14 0226 6' (1.829 m)     Head Cir --      Peak Flow --      Pain Score 11/16/14 0230 0     Pain Loc --      Pain Edu? --      Excl. in GC? --      Constitutional: Alert and oriented. Well appearing and in no distress. Eyes: Conjunctivae are normal. PERRL. Normal extraocular movements. ENT   Head: Normocephalic and atraumatic.   Nose: No congestion/rhinnorhea.   Mouth/Throat: Mucous membranes are moist.   Neck: No stridor. Hematological/Lymphatic/Immunilogical: No cervical lymphadenopathy. Cardiovascular: Normal rate, regular rhythm. Normal and symmetric distal pulses are present in all extremities. No murmurs, rubs, or gallops. Respiratory: Normal respiratory effort without tachypnea nor retractions. Breath sounds are clear and equal bilaterally. No wheezes/rales/rhonchi. Gastrointestinal: Soft and nontender. No distention. There is no CVA tenderness. Genitourinary: deferred Musculoskeletal: Nontender with normal range of motion in all extremities. No joint effusions.  No lower extremity tenderness nor edema. Neurologic:  Normal speech and language. No gross focal neurologic deficits are appreciated. Speech is normal.  Skin:  Skin is warm, dry and intact. No rash noted. Psychiatric: Mood and affect are normal. Speech and behavior are normal. Patient exhibits appropriate insight and judgment.  ____________________________________________    LABS (pertinent positives/negatives)  Labs Reviewed  COMPREHENSIVE METABOLIC PANEL - Abnormal; Notable for the following:    Calcium 8.7 (*)    ALT 6 (*)    All other components within normal limits  ACETAMINOPHEN LEVEL - Abnormal; Notable for the following:    Acetaminophen (Tylenol), Serum <10 (*)    All other components within normal limits  CBC - Abnormal; Notable for the following:    RBC 4.19 (*)    All other components within  normal limits  ETHANOL  SALICYLATE LEVEL  URINE DRUG SCREEN, QUALITATIVE ([facility] ONLY)        INITIAL IMPRESSION / ASSESSMENT AND PLAN / ED COURSE  Pertinent labs & imaging results that were available during my care of the  patient were reviewed by me and considered in my medical decision making (see chart for details).    ____________________________________________   FINAL CLINICAL IMPRESSION(S) / ED DIAGNOSES  Final diagnoses:  Schizoaffective disorder, unspecified type      Darci Current, MD 11/16/14 [phone]

## 2014-11-16 NOTE — ED Notes (Signed)
BEHAVIORAL HEALTH ROUNDING Patient sleeping: Yes.   Patient alert and oriented: not applicable Behavior appropriate: Yes.    Nutrition and fluids offered: No Toileting and hygiene offered: No Sitter present: q15 minute observations Law enforcement present: Yes [employer] 

## 2014-11-16 NOTE — ED Provider Notes (Deleted)
No events overnight patient rest comfortably throughout the course of the night without incident.   Darci Current, MD 11/16/14 [phone]

## 2014-11-16 NOTE — ED Notes (Signed)
Pharmacy tech called group home for updated medication list, awaiting fax of medication list

## 2014-11-16 NOTE — ED Notes (Signed)
Pt. Brought here by [employer].  Pt. Walked to police dept. And presented them with a note stating "I want to get off the streets tonight.  I will bust someone across the head with a brick.  Then I will cut my wrist with a razor"  Pt. States he does not like staying at group home.

## 2014-11-16 NOTE — ED Provider Notes (Signed)
The patient remains medically clear, not a threat to himself or anyone else. Patient is abusing the system, stable for discharge  Emily Filbert, MD 11/16/14 1000

## 2014-11-16 NOTE — BHH Counselor (Signed)
Called the AG to check on the status for discharge. Shanda Bumps from [facility] stated someone will pick up the Pt after 3:00 on 11/16/14

## 2014-11-21 ENCOUNTER — Emergency Department
Admission: EM | Admit: 2014-11-21 | Discharge: 2014-11-21 | Disposition: A | Discharge status: Home / Self Care | Payer: Medicare Other | Attending: Emergency Medicine | Admitting: Emergency Medicine

## 2014-11-21 DIAGNOSIS — Z139 Encounter for screening, unspecified: Secondary | ICD-10-CM

## 2014-11-21 DIAGNOSIS — Z79899 Other long term (current) drug therapy: Secondary | ICD-10-CM | POA: Insufficient documentation

## 2014-11-21 DIAGNOSIS — F329 Major depressive disorder, single episode, unspecified: Secondary | ICD-10-CM | POA: Diagnosis not present

## 2014-11-21 DIAGNOSIS — Z5900 Homelessness unspecified: Secondary | ICD-10-CM

## 2014-11-21 DIAGNOSIS — I10 Essential (primary) hypertension: Secondary | ICD-10-CM | POA: Insufficient documentation

## 2014-11-21 DIAGNOSIS — Z7982 Long term (current) use of aspirin: Secondary | ICD-10-CM | POA: Diagnosis not present

## 2014-11-21 DIAGNOSIS — Z720 Tobacco use: Secondary | ICD-10-CM | POA: Insufficient documentation

## 2014-11-21 DIAGNOSIS — Z046 Encounter for general psychiatric examination, requested by authority: Secondary | ICD-10-CM | POA: Diagnosis present

## 2014-11-21 DIAGNOSIS — F259 Schizoaffective disorder, unspecified: Secondary | ICD-10-CM | POA: Diagnosis not present

## 2014-11-21 NOTE — ED Provider Notes (Signed)
[facility] Emergency Department Provider Note  ____________________________________________  Time seen: 2:30 AM  I have reviewed the triage vital signs and the nursing notes.   HISTORY  Chief Complaint Mental Health Problem      HPI [name] is a 54 y.o. male resents stating that he is homeless and hungry patient apparently threatened to police officers that he would cut himself with a razor blade if he was not taken off the street. Of note patient was recently seen in this emergency department has been seen here multiple times for similar complaints. Patient denies suicidal or homicidal ideation at this time.     Past Medical History  Diagnosis Date  . HIV (human immunodeficiency virus infection)   . BPH (benign prostatic hypertrophy)   . Dyslipidemia   . Hypertension   . Hypothyroidism   . Schizoaffective disorder     Patient Active Problem List   Diagnosis Date Noted  . Schizoaffective disorder 10/25/2014  . Alcohol abuse 10/25/2014    No past surgical history on file.  Current Outpatient Rx  Name  Route  Sig  Dispense  Refill  . aspirin 81 MG tablet   Oral   Take 81 mg by mouth daily.         . benztropine (COGENTIN) 1 MG tablet   Oral   Take 1 mg by mouth at bedtime.         . carvedilol (COREG) 3.125 MG tablet   Oral   Take 3.125 mg by mouth 2 (two) times daily with a meal.         . cholecalciferol (VITAMIN D) 1000 UNITS tablet   Oral   Take 2,000 Units by mouth daily.         . divalproex (DEPAKOTE) 500 MG DR tablet   Oral   Take 500 mg by mouth every morning.         . divalproex (DEPAKOTE) 500 MG DR tablet   Oral   Take 1,000 mg by mouth at bedtime.         . docusate sodium (COLACE) 100 MG capsule   Oral   Take 100 mg by mouth daily.         Marland Kitchen efavirenz-emtricitabine-tenofovir (ATRIPLA) 600-200-300 MG per tablet   Oral   Take 1 tablet by mouth at bedtime.         . fluPHENAZine (PROLIXIN)  10 MG tablet   Oral   Take 10 mg by mouth at bedtime.         . fluPHENAZine (PROLIXIN) 5 MG tablet   Oral   Take 5 mg by mouth every morning.         Marland Kitchen levothyroxine (SYNTHROID, LEVOTHROID) 25 MCG tablet   Oral   Take 25 mcg by mouth daily before breakfast.         . lithium 600 MG capsule   Oral   Take 600 mg by mouth 2 (two) times daily with a meal.         . paliperidone (INVEGA SUSTENNA) 156 MG/ML SUSP injection   Intramuscular   Inject 156 mg into the muscle every 30 (thirty) days.         . simvastatin (ZOCOR) 20 MG tablet   Oral   Take 20 mg by mouth daily.         . tamsulosin (FLOMAX) 0.4 MG CAPS capsule   Oral   Take 0.4 mg by mouth at bedtime.         Marland Kitchen  terazosin (HYTRIN) 1 MG capsule   Oral   Take 1 mg by mouth at bedtime.           Allergies No known drug allergies  No family history on file.  Social History Social History  Substance Use Topics  . Smoking status: Current Every Day Smoker -- 2.00 packs/day    Types: Cigarettes  . Smokeless tobacco: Not on file  . Alcohol Use: 1.2 - 1.8 oz/week    2-3 Cans of beer per week    Review of Systems  Constitutional: Negative for fever. Eyes: Negative for visual changes. ENT: Negative for sore throat. Cardiovascular: Negative for chest pain. Respiratory: Negative for shortness of breath. Gastrointestinal: Negative for abdominal pain, vomiting and diarrhea. Genitourinary: Negative for dysuria. Musculoskeletal: Negative for back pain. Skin: Negative for rash. Neurological: Negative for headaches, focal weakness or numbness. Psychiatric: Depression 10-point ROS otherwise negative.  ____________________________________________   PHYSICAL EXAM:  VITAL SIGNS: ED Triage Vitals  Enc Vitals Group     BP 11/21/14 0309 146/82 mmHg     Pulse Rate 11/21/14 0309 88     Resp 11/21/14 0309 20     Temp 11/21/14 0309 97.6 F (36.4 C)     Temp Source 11/21/14 0309 Oral     SpO2 11/21/14  0309 95 %     Weight 11/21/14 0309 175 lb (79.379 kg)     Height 11/21/14 0309 6' (1.829 m)     Head Cir --      Peak Flow --      Pain Score --      Pain Loc --      Pain Edu? --      Excl. in GC? --      Constitutional: Alert and oriented. Well appearing and in no distress. Eyes: Conjunctivae are normal. PERRL. Normal extraocular movements. ENT   Head: Normocephalic and atraumatic.   Nose: No congestion/rhinnorhea.   Mouth/Throat: Mucous membranes are moist.   Neck: No stridor. Hematological/Lymphatic/Immunilogical: No cervical lymphadenopathy. Cardiovascular: Normal rate, regular rhythm. Normal and symmetric distal pulses are present in all extremities. No murmurs, rubs, or gallops. Respiratory: Normal respiratory effort without tachypnea nor retractions. Breath sounds are clear and equal bilaterally. No wheezes/rales/rhonchi. Gastrointestinal: Soft and nontender. No distention. There is no CVA tenderness. Genitourinary: deferred Musculoskeletal: Nontender with normal range of motion in all extremities. No joint effusions.  No lower extremity tenderness nor edema. Neurologic:  Normal speech and language. No gross focal neurologic deficits are appreciated. Speech is normal.  Skin:  Skin is warm, dry and intact. No rash noted. Psychiatric: Mood and affect are normal. Speech and behavior are normal. Patient exhibits appropriate insight and judgment.    INITIAL IMPRESSION / ASSESSMENT AND PLAN / ED COURSE  Pertinent labs & imaging results that were available during my care of the patient were reviewed by me and considered in my medical decision making (see chart for details).  I gave [name] something to eat and drink and will allow him to rest in the emergency department overnight with plan for discharge in the morning.  ____________________________________________   FINAL CLINICAL IMPRESSION(S) / ED DIAGNOSES  Final diagnoses:  Homeless  Encounter for  medical screening examination  Schizoaffective disorder, unspecified type      Darci Current, MD 11/21/14 [phone]

## 2014-11-21 NOTE — ED Notes (Signed)
Pt in room. No complaints or concerns voiced at this time. No abnormal behavior noted at this time. . 

## 2014-11-21 NOTE — ED Notes (Signed)
Pt given peanut butter and graham crackers, with 240 ml cup of lemon lime soda, whiel waiting for sandwich tray.

## 2014-11-21 NOTE — ED Notes (Signed)
Pt in room. No complaints or concerns voiced at this time. No abnormal behavior noted at this time. Marland Kitchen

## 2014-11-21 NOTE — ED Notes (Signed)
Pt to catch bus then go to group home.

## 2014-11-21 NOTE — ED Notes (Signed)

## 2014-11-21 NOTE — Discharge Instructions (Signed)
Schizoaffective Disorder Schizoaffective disorder (ScAD) is a mental illness. It causes symptoms that are a mixture of schizophrenia (a psychotic disorder) and an affective (mood) disorder. The schizophrenic symptoms may include delusions, hallucinations, or odd behavior. The mood symptoms may be similar to major depression or bipolar disorder. ScAD may interfere with personal relationships or normal daily activities. People with ScAD are at increased risk for job loss, social isolation,physical health problems, anxiety and substance use disorders, and suicide. ScAD usually occurs in cycles. Periods of severe symptoms are followed by periods of less severe symptoms or improvement. The illness affects men and women equally but usually appears at an earlier age (teenage or early adult years) in men. People who have family members with schizophrenia, bipolar disorder, or ScAD are at higher risk of developing ScAD. SYMPTOMS  At any one time, people with ScAD may have psychotic symptoms only or both psychotic and mood symptoms. The psychotic symptoms include one or more of the following:  Hearing, seeing, or feeling things that are not there (hallucinations).   Having fixed, false beliefs (delusions). The delusions usually are of being attacked, harassed, cheated, persecuted, or conspired against (paranoid delusions).  Speaking in a way that makes no sense to others (disorganized speech). The psychotic symptoms of ScAD may also include confusing or odd behavior or any of the negative symptoms of schizophrenia. These include loss of motivation for normal daily activities, such as bathing or grooming, withdrawal from other people, and lack of emotions.  The mood symptoms of ScAD occur more often than not. They resemble major depressive disorder or bipolar mania. Symptoms of major depression include depressed mood and four or more of the following:  Loss of interest in usually pleasurable activities  (anhedonia).  Sleeping more or less than normal.  Feeling worthless or excessively guilty.  Lack of energy or motivation.  Trouble concentrating.  Eating more or less than usual.  Thinking a lot about death or suicide. Symptoms of bipolar mania include abnormally elevated or irritable mood and increased energy or activity, plus three or more of the following:   More confidence than normal or feeling that you are able to do anything (grandiosity).  Feeling rested with less sleep than normal.   Being easily distracted.   Talking more than usual or feeling pressured to keep talking.   Feeling that your thoughts are racing.  Engaging in high-risk activities such as buying sprees or foolish business decisions. DIAGNOSIS  ScAD is diagnosed through an assessment by your health care provider. Your health care provider will observe and ask questions about your thoughts, behavior, mood, and ability to function in daily life. Your health care provider may also ask questions about your medical history and use of drugs, including prescription medicines. Your health care provider may also order blood tests and imaging exams. Certain medical conditions and substances can cause symptoms that resemble ScAD. Your health care provider may refer you to a mental health specialist for evaluation.  ScAD is divided into two types. The depressive type is diagnosed if your mood symptoms are limited to major depression. The bipolar type is diagnosed if your mood symptoms are manic or a mixture of manic and depressive symptoms TREATMENT  ScAD is usually a lifelong illness. Long-term treatment is necessary. The following treatments are available:  Medicine. Different types of medicine are used to treat ScAD. The exact combination depends on the type and severity of your symptoms. Antipsychotic medicine is used to control psychotic symptomssuch as delusions, paranoia,   and hallucinations. Mood stabilizers can  even the highs and lows of bipolar manic mood swings. Antidepressant medicines are used to treat major depressive symptoms.  Counseling or talk therapy. Individual, group, or family counseling may be helpful in providing education, support, and guidance. Many people with ScAD also benefit from social skills and job skills (vocational) training. A combination of medicine and counseling is usually best for managing the disorder over time. A procedure in which electricity is applied to the brain through the scalp (electroconvulsive therapy) may be used to treat people with severe manic symptoms that do not respond to medicine and counseling. HOME CARE INSTRUCTIONS   Take all your medicine as prescribed.  Check with your health care provider before starting new prescription or over-the-counter medicines.  Keep all follow up appointments with your health care provider. SEEK MEDICAL CARE IF:   If you are not able to take your medicines as prescribed.  If your symptoms get worse. SEEK IMMEDIATE MEDICAL CARE IF:   You have serious thoughts about hurting yourself or others. Document Released: 07/20/2006 Document Revised: 07/24/2013 Document Reviewed: 10/21/2012 Exit[employer] Patient Information 2015 [employer]. This information is not intended to replace advice given to you by your health care provider. Make sure you discuss any questions you have with your health care provider.  

## 2014-11-21 NOTE — ED Notes (Signed)
Patient ambulatory to triage with steady gait, without difficulty or distress noted, brought in by [facility] officer; pt st "I'm gonna cut myself with a razor"; pt d/c just few days ago for same; has been seen multiple times for same

## 2014-11-22 ENCOUNTER — Emergency Department
Admission: EM | Admit: 2014-11-22 | Discharge: 2014-11-22 | Disposition: A | Discharge status: Home / Self Care | Payer: Medicare Other | Attending: Emergency Medicine | Admitting: Emergency Medicine

## 2014-11-22 ENCOUNTER — Encounter: Payer: Self-pay | Admitting: *Deleted

## 2014-11-22 DIAGNOSIS — Z79899 Other long term (current) drug therapy: Secondary | ICD-10-CM | POA: Diagnosis not present

## 2014-11-22 DIAGNOSIS — I10 Essential (primary) hypertension: Secondary | ICD-10-CM | POA: Diagnosis not present

## 2014-11-22 DIAGNOSIS — F250 Schizoaffective disorder, bipolar type: Secondary | ICD-10-CM | POA: Diagnosis not present

## 2014-11-22 DIAGNOSIS — F919 Conduct disorder, unspecified: Secondary | ICD-10-CM | POA: Diagnosis present

## 2014-11-22 DIAGNOSIS — Z7982 Long term (current) use of aspirin: Secondary | ICD-10-CM | POA: Insufficient documentation

## 2014-11-22 DIAGNOSIS — Z720 Tobacco use: Secondary | ICD-10-CM | POA: Insufficient documentation
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
## 2014-11-22 NOTE — ED Notes (Signed)
Pt brought in by [employer].  Pt was knocking on people's doors in the rain trying to get a ride to the hospital. Pt was seeen in ER yesterday.  Pt states he does not want to go to the group home that he is in.  Denies SI or HI.  Denies drug use or etoh use.

## 2014-11-22 NOTE — Discharge Instructions (Signed)
You were seen in the ER today reporting that you did not want to go back to your group home and wanted to sleep in the ER. The ER is not an appropriate place to sleep. It is for people who are sick. Please plan to stay at your group home every night. If you are unhappy with the arrangements there, you should discuss it with your ACT team who can try to get you assigned to a different group home.  Schizoaffective Disorder Schizoaffective disorder (ScAD) is a mental illness. It causes symptoms that are a mixture of schizophrenia (a psychotic disorder) and an affective (mood) disorder. The schizophrenic symptoms may include delusions, hallucinations, or odd behavior. The mood symptoms may be similar to major depression or bipolar disorder. ScAD may interfere with personal relationships or normal daily activities. People with ScAD are at increased risk for job loss, social isolation,physical health problems, anxiety and substance use disorders, and suicide. ScAD usually occurs in cycles. Periods of severe symptoms are followed by periods of less severe symptoms or improvement. The illness affects men and women equally but usually appears at an earlier age (teenage or early adult years) in men. People who have family members with schizophrenia, bipolar disorder, or ScAD are at higher risk of developing ScAD. SYMPTOMS  At any one time, people with ScAD may have psychotic symptoms only or both psychotic and mood symptoms. The psychotic symptoms include one or more of the following:  Hearing, seeing, or feeling things that are not there (hallucinations).   Having fixed, false beliefs (delusions). The delusions usually are of being attacked, harassed, cheated, persecuted, or conspired against (paranoid delusions).  Speaking in a way that makes no sense to others (disorganized speech). The psychotic symptoms of ScAD may also include confusing or odd behavior or any of the negative symptoms of schizophrenia.  These include loss of motivation for normal daily activities, such as bathing or grooming, withdrawal from other people, and lack of emotions.  The mood symptoms of ScAD occur more often than not. They resemble major depressive disorder or bipolar mania. Symptoms of major depression include depressed mood and four or more of the following:  Loss of interest in usually pleasurable activities (anhedonia).  Sleeping more or less than normal.  Feeling worthless or excessively guilty.  Lack of energy or motivation.  Trouble concentrating.  Eating more or less than usual.  Thinking a lot about death or suicide. Symptoms of bipolar mania include abnormally elevated or irritable mood and increased energy or activity, plus three or more of the following:   More confidence than normal or feeling that you are able to do anything (grandiosity).  Feeling rested with less sleep than normal.   Being easily distracted.   Talking more than usual or feeling pressured to keep talking.   Feeling that your thoughts are racing.  Engaging in high-risk activities such as buying sprees or foolish business decisions. DIAGNOSIS  ScAD is diagnosed through an assessment by your health care provider. Your health care provider will observe and ask questions about your thoughts, behavior, mood, and ability to function in daily life. Your health care provider may also ask questions about your medical history and use of drugs, including prescription medicines. Your health care provider may also order blood tests and imaging exams. Certain medical conditions and substances can cause symptoms that resemble ScAD. Your health care provider may refer you to a mental health specialist for evaluation.  ScAD is divided into two types. The depressive  type is diagnosed if your mood symptoms are limited to major depression. The bipolar type is diagnosed if your mood symptoms are manic or a mixture of manic and depressive  symptoms TREATMENT  ScAD is usually a lifelong illness. Long-term treatment is necessary. The following treatments are available:  Medicine. Different types of medicine are used to treat ScAD. The exact combination depends on the type and severity of your symptoms. Antipsychotic medicine is used to control psychotic symptomssuch as delusions, paranoia, and hallucinations. Mood stabilizers can even the highs and lows of bipolar manic mood swings. Antidepressant medicines are used to treat major depressive symptoms.  Counseling or talk therapy. Individual, group, or family counseling may be helpful in providing education, support, and guidance. Many people with ScAD also benefit from social skills and job skills (vocational) training. A combination of medicine and counseling is usually best for managing the disorder over time. A procedure in which electricity is applied to the brain through the scalp (electroconvulsive therapy) may be used to treat people with severe manic symptoms that do not respond to medicine and counseling. HOME CARE INSTRUCTIONS   Take all your medicine as prescribed.  Check with your health care provider before starting new prescription or over-the-counter medicines.  Keep all follow up appointments with your health care provider. SEEK MEDICAL CARE IF:   If you are not able to take your medicines as prescribed.  If your symptoms get worse. SEEK IMMEDIATE MEDICAL CARE IF:   You have serious thoughts about hurting yourself or others. Document Released: 07/20/2006 Document Revised: 07/24/2013 Document Reviewed: 10/21/2012 [facility] Patient Information 2015 [place], [place]. This information is not intended to replace advice given to you by your health care provider. Make sure you discuss any questions you have with your health care provider.

## 2014-11-22 NOTE — ED Notes (Signed)
Pt discharged to group home in cab; nad noted.

## 2014-12-24 DIAGNOSIS — I10 Essential (primary) hypertension: Secondary | ICD-10-CM | POA: Diagnosis not present

## 2014-12-24 DIAGNOSIS — Z7982 Long term (current) use of aspirin: Secondary | ICD-10-CM | POA: Insufficient documentation

## 2014-12-24 DIAGNOSIS — Z720 Tobacco use: Secondary | ICD-10-CM | POA: Insufficient documentation

## 2014-12-24 DIAGNOSIS — Z79899 Other long term (current) drug therapy: Secondary | ICD-10-CM | POA: Diagnosis not present

## 2014-12-24 DIAGNOSIS — R451 Restlessness and agitation: Secondary | ICD-10-CM | POA: Diagnosis present

## 2014-12-24 DIAGNOSIS — F259 Schizoaffective disorder, unspecified: Secondary | ICD-10-CM | POA: Insufficient documentation

## 2014-12-25 ENCOUNTER — Encounter: Payer: Self-pay | Admitting: Emergency Medicine

## 2014-12-25 ENCOUNTER — Emergency Department
Admission: EM | Admit: 2014-12-25 | Discharge: 2014-12-25 | Disposition: A | Discharge status: Home / Self Care | Payer: Medicare Other | Attending: Emergency Medicine | Admitting: Emergency Medicine

## 2014-12-25 DIAGNOSIS — F259 Schizoaffective disorder, unspecified: Secondary | ICD-10-CM

## 2014-12-25 NOTE — ED Provider Notes (Signed)
[facility] Emergency Department Provider Note    ____________________________________________  Time seen: 0130  I have reviewed the triage vital signs and the nursing notes.   HISTORY  Chief Complaint Agitation   History limited by: Not Limited   HPI [name] is a 54 y.o. male who has a history of schizoaffective. When asked how we could help the patient he states that we could 1: feed him and 2: discharge him to the lobby. He states that he does not want a room in the hallway. He would rather have a room with the TV. He currently denies any SI or HI. Otherwise patient had no complaints.   Past Medical History  Diagnosis Date  . HIV (human immunodeficiency virus infection) (HCC)   . BPH (benign prostatic hypertrophy)   . Dyslipidemia   . Hypertension   . Hypothyroidism   . Schizoaffective disorder [facility])     Patient Active Problem List   Diagnosis Date Noted  . Schizoaffective disorder (HCC) 10/25/2014  . Alcohol abuse 10/25/2014    History reviewed. No pertinent past surgical history.  Current Outpatient Rx  Name  Route  Sig  Dispense  Refill  . aspirin 81 MG tablet   Oral   Take 81 mg by mouth daily.         . benztropine (COGENTIN) 1 MG tablet   Oral   Take 1 mg by mouth at bedtime.         . carvedilol (COREG) 3.125 MG tablet   Oral   Take 3.125 mg by mouth 2 (two) times daily with a meal.         . cholecalciferol (VITAMIN D) 1000 UNITS tablet   Oral   Take 2,000 Units by mouth daily.         . divalproex (DEPAKOTE) 500 MG DR tablet   Oral   Take 500 mg by mouth every morning.         . divalproex (DEPAKOTE) 500 MG DR tablet   Oral   Take 1,000 mg by mouth at bedtime.         . docusate sodium (COLACE) 100 MG capsule   Oral   Take 100 mg by mouth daily.         Marland Kitchen efavirenz-emtricitabine-tenofovir (ATRIPLA) 600-200-300 MG per tablet   Oral   Take 1 tablet by mouth at bedtime.         .  fluPHENAZine (PROLIXIN) 10 MG tablet   Oral   Take 10 mg by mouth at bedtime.         . fluPHENAZine (PROLIXIN) 5 MG tablet   Oral   Take 5 mg by mouth every morning.         Marland Kitchen levothyroxine (SYNTHROID, LEVOTHROID) 25 MCG tablet   Oral   Take 25 mcg by mouth daily before breakfast.         . lithium 600 MG capsule   Oral   Take 600 mg by mouth 2 (two) times daily with a meal.         . paliperidone (INVEGA SUSTENNA) 156 MG/ML SUSP injection   Intramuscular   Inject 156 mg into the muscle every 30 (thirty) days.         . simvastatin (ZOCOR) 20 MG tablet   Oral   Take 20 mg by mouth daily.         . tamsulosin (FLOMAX) 0.4 MG CAPS capsule   Oral   Take 0.4 mg  by mouth at bedtime.         Marland Kitchen terazosin (HYTRIN) 1 MG capsule   Oral   Take 1 mg by mouth at bedtime.           Allergies Review of patient's allergies indicates no known allergies.  History reviewed. No pertinent family history.  Social History Social History  Substance Use Topics  . Smoking status: Current Every Day Smoker -- 2.00 packs/day    Types: Cigarettes  . Smokeless tobacco: None  . Alcohol Use: 1.2 - 1.8 oz/week    2-3 Cans of beer per week    Review of Systems  Constitutional: Negative for fever. Cardiovascular: Negative for chest pain. Respiratory: Negative for shortness of breath. Gastrointestinal: Negative for abdominal pain, vomiting and diarrhea. Genitourinary: Negative for dysuria. Musculoskeletal: Negative for back pain. Skin: Negative for rash. Neurological: Negative for headaches, focal weakness or numbness. Psychiatric:Denies any SI or HI   10-point ROS otherwise negative.  ____________________________________________   PHYSICAL EXAM:  VITAL SIGNS: ED Triage Vitals  Enc Vitals Group     BP 12/25/14 0033 130/94 mmHg     Pulse Rate 12/25/14 0033 82     Resp 12/25/14 0033 16     Temp 12/25/14 0033 97.5 F (36.4 C)     Temp Source 12/25/14 0033 Oral      SpO2 12/25/14 0033 98 %     Weight 12/25/14 0033 170 lb (77.111 kg)     Height 12/25/14 0033 6' (1.829 m)     Head Cir --      Peak Flow --      Pain Score 12/25/14 0031 0   Constitutional: Alert and oriented. Well appearing and in no distress. Eyes: Conjunctivae are normal. PERRL. Normal extraocular movements. ENT   Head: Normocephalic and atraumatic.   Nose: No congestion/rhinnorhea.   Mouth/Throat: Mucous membranes are moist.   Neck: No stridor. Hematological/Lymphatic/Immunilogical: No cervical lymphadenopathy. Cardiovascular: Normal rate, regular rhythm.  No murmurs, rubs, or gallops. Respiratory: Normal respiratory effort without tachypnea nor retractions. Breath sounds are clear and equal bilaterally. No wheezes/rales/rhonchi. Gastrointestinal: Soft and nontender. No distention. There is no CVA tenderness. Genitourinary: Deferred Musculoskeletal: Normal range of motion in all extremities. No joint effusions.  No lower extremity tenderness nor edema. Neurologic:  Normal speech and language. No gross focal neurologic deficits are appreciated. Speech is normal.  Skin:  Skin is warm, dry and intact. No rash noted. Psychiatric: Mood and affect are normal. Speech and behavior are normal. Patient exhibits appropriate insight and judgment.  ____________________________________________    LABS (pertinent positives/negatives)  None  ____________________________________________   EKG  None  ____________________________________________    RADIOLOGY  None   ____________________________________________   PROCEDURES  Procedure(s) performed: None  Critical Care performed: No  ____________________________________________   INITIAL IMPRESSION / ASSESSMENT AND PLAN / ED COURSE  Pertinent labs & imaging results that were available during my care of the patient were reviewed by me and considered in my medical decision making (see chart for  details).  Patient presents to the emergency department stating that he would like me to feed him and then discharge him back to the lobby. He has no other complaints at this time. On physical exam no concerning findings. Will discharge him.  ____________________________________________   FINAL CLINICAL IMPRESSION(S) / ED DIAGNOSES  Final diagnoses:  Schizoaffective disorder, unspecified type (HCC)     Phineas Semen, MD 12/25/14 [phone]
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
## 2014-12-25 NOTE — ED Notes (Signed)

## 2014-12-25 NOTE — ED Notes (Signed)
Pt presents to ED stating that he went to [employer] and not able to walk back to group at night. No other complaints.

## 2014-12-25 NOTE — ED Notes (Signed)
reopened chart to add note-- Attempted to call [facility] as pt is a resident there. No answer after 15 rings. Will attempt to call again later.

## 2014-12-25 NOTE — ED Notes (Signed)
Per Dr Derrill Kay no labs ordered for this patient.

## 2014-12-25 NOTE — Discharge Instructions (Signed)
Please seek medical attention and help for any thoughts about wanting to harm herself, harm others, any concerning change in behavior, severe depression, inappropriate drug use or any other new or concerning symptoms.  Schizoaffective Disorder Schizoaffective disorder (ScAD) is a mental illness. It causes symptoms that are a mixture of schizophrenia (a psychotic disorder) and an affective (mood) disorder. The schizophrenic symptoms may include delusions, hallucinations, or odd behavior. The mood symptoms may be similar to major depression or bipolar disorder. ScAD may interfere with personal relationships or normal daily activities. People with ScAD are at increased risk for job loss, social isolation,physical health problems, anxiety and substance use disorders, and suicide. ScAD usually occurs in cycles. Periods of severe symptoms are followed by periods of less severe symptoms or improvement. The illness affects men and women equally but usually appears at an earlier age (teenage or early adult years) in men. People who have family members with schizophrenia, bipolar disorder, or ScAD are at higher risk of developing ScAD. SYMPTOMS  At any one time, people with ScAD may have psychotic symptoms only or both psychotic and mood symptoms. The psychotic symptoms include one or more of the following:  Hearing, seeing, or feeling things that are not there (hallucinations).   Having fixed, false beliefs (delusions). The delusions usually are of being attacked, harassed, cheated, persecuted, or conspired against (paranoid delusions).  Speaking in a way that makes no sense to others (disorganized speech). The psychotic symptoms of ScAD may also include confusing or odd behavior or any of the negative symptoms of schizophrenia. These include loss of motivation for normal daily activities, such as bathing or grooming, withdrawal from other people, and lack of emotions.  The mood symptoms of ScAD occur more  often than not. They resemble major depressive disorder or bipolar mania. Symptoms of major depression include depressed mood and four or more of the following:  Loss of interest in usually pleasurable activities (anhedonia).  Sleeping more or less than normal.  Feeling worthless or excessively guilty.  Lack of energy or motivation.  Trouble concentrating.  Eating more or less than usual.  Thinking a lot about death or suicide. Symptoms of bipolar mania include abnormally elevated or irritable mood and increased energy or activity, plus three or more of the following:   More confidence than normal or feeling that you are able to do anything (grandiosity).  Feeling rested with less sleep than normal.   Being easily distracted.   Talking more than usual or feeling pressured to keep talking.   Feeling that your thoughts are racing.  Engaging in high-risk activities such as buying sprees or foolish business decisions. DIAGNOSIS  ScAD is diagnosed through an assessment by your health care provider. Your health care provider will observe and ask questions about your thoughts, behavior, mood, and ability to function in daily life. Your health care provider may also ask questions about your medical history and use of drugs, including prescription medicines. Your health care provider may also order blood tests and imaging exams. Certain medical conditions and substances can cause symptoms that resemble ScAD. Your health care provider may refer you to a mental health specialist for evaluation.  ScAD is divided into two types. The depressive type is diagnosed if your mood symptoms are limited to major depression. The bipolar type is diagnosed if your mood symptoms are manic or a mixture of manic and depressive symptoms TREATMENT  ScAD is usually a lifelong illness. Long-term treatment is necessary. The following treatments are available:  Medicine. Different types of medicine are used to  treat ScAD. The exact combination depends on the type and severity of your symptoms. Antipsychotic medicine is used to control psychotic symptomssuch as delusions, paranoia, and hallucinations. Mood stabilizers can even the highs and lows of bipolar manic mood swings. Antidepressant medicines are used to treat major depressive symptoms.  Counseling or talk therapy. Individual, group, or family counseling may be helpful in providing education, support, and guidance. Many people with ScAD also benefit from social skills and job skills (vocational) training. A combination of medicine and counseling is usually best for managing the disorder over time. A procedure in which electricity is applied to the brain through the scalp (electroconvulsive therapy) may be used to treat people with severe manic symptoms that do not respond to medicine and counseling. HOME CARE INSTRUCTIONS   Take all your medicine as prescribed.  Check with your health care provider before starting new prescription or over-the-counter medicines.  Keep all follow up appointments with your health care provider. SEEK MEDICAL CARE IF:   If you are not able to take your medicines as prescribed.  If your symptoms get worse. SEEK IMMEDIATE MEDICAL CARE IF:   You have serious thoughts about hurting yourself or others. Document Released: 07/20/2006 Document Revised: 07/24/2013 Document Reviewed: 10/21/2012 [facility] Patient Information 2015 [place], [place]. This information is not intended to replace advice given to you by your health care provider. Make sure you discuss any questions you have with your health care provider.

## 2014-12-26 ENCOUNTER — Emergency Department
Admission: EM | Admit: 2014-12-26 | Discharge: 2014-12-26 | Disposition: A | Discharge status: Other Acute Inpatient Hospital | Payer: Medicare Other | Attending: Emergency Medicine | Admitting: Emergency Medicine

## 2014-12-26 ENCOUNTER — Inpatient Hospital Stay
Admission: EM | Admit: 2014-12-26 | Discharge: 2015-01-31 | DRG: 885 | Disposition: A | Discharge status: Other Acute Inpatient Hospital | Payer: Medicare Other | Source: Intra-hospital | Attending: Psychiatry | Admitting: Psychiatry

## 2014-12-26 DIAGNOSIS — R440 Auditory hallucinations: Secondary | ICD-10-CM | POA: Diagnosis present

## 2014-12-26 DIAGNOSIS — E039 Hypothyroidism, unspecified: Secondary | ICD-10-CM | POA: Diagnosis present

## 2014-12-26 DIAGNOSIS — I10 Essential (primary) hypertension: Secondary | ICD-10-CM | POA: Diagnosis present

## 2014-12-26 DIAGNOSIS — Z7289 Other problems related to lifestyle: Secondary | ICD-10-CM

## 2014-12-26 DIAGNOSIS — K4030 Unilateral inguinal hernia, with obstruction, without gangrene, not specified as recurrent: Secondary | ICD-10-CM

## 2014-12-26 DIAGNOSIS — N400 Enlarged prostate without lower urinary tract symptoms: Secondary | ICD-10-CM | POA: Diagnosis present

## 2014-12-26 DIAGNOSIS — F250 Schizoaffective disorder, bipolar type: Secondary | ICD-10-CM | POA: Diagnosis present

## 2014-12-26 DIAGNOSIS — F259 Schizoaffective disorder, unspecified: Secondary | ICD-10-CM | POA: Diagnosis not present

## 2014-12-26 DIAGNOSIS — R45851 Suicidal ideations: Secondary | ICD-10-CM | POA: Diagnosis present

## 2014-12-26 DIAGNOSIS — Z79899 Other long term (current) drug therapy: Secondary | ICD-10-CM | POA: Insufficient documentation

## 2014-12-26 DIAGNOSIS — G4700 Insomnia, unspecified: Secondary | ICD-10-CM | POA: Diagnosis present

## 2014-12-26 DIAGNOSIS — Z008 Encounter for other general examination: Secondary | ICD-10-CM | POA: Diagnosis present

## 2014-12-26 DIAGNOSIS — K5900 Constipation, unspecified: Secondary | ICD-10-CM | POA: Diagnosis present

## 2014-12-26 DIAGNOSIS — F17210 Nicotine dependence, cigarettes, uncomplicated: Secondary | ICD-10-CM | POA: Diagnosis present

## 2014-12-26 DIAGNOSIS — Z818 Family history of other mental and behavioral disorders: Secondary | ICD-10-CM | POA: Diagnosis not present

## 2014-12-26 DIAGNOSIS — N5082 Scrotal pain: Secondary | ICD-10-CM | POA: Diagnosis not present

## 2014-12-26 DIAGNOSIS — Z590 Homelessness: Secondary | ICD-10-CM | POA: Diagnosis not present

## 2014-12-26 DIAGNOSIS — F17200 Nicotine dependence, unspecified, uncomplicated: Secondary | ICD-10-CM | POA: Diagnosis present

## 2014-12-26 DIAGNOSIS — Z7982 Long term (current) use of aspirin: Secondary | ICD-10-CM | POA: Insufficient documentation

## 2014-12-26 DIAGNOSIS — Z9114 Patient's other noncompliance with medication regimen: Secondary | ICD-10-CM | POA: Diagnosis not present

## 2014-12-26 DIAGNOSIS — E785 Hyperlipidemia, unspecified: Secondary | ICD-10-CM

## 2014-12-26 DIAGNOSIS — Z8249 Family history of ischemic heart disease and other diseases of the circulatory system: Secondary | ICD-10-CM

## 2014-12-26 DIAGNOSIS — Z21 Asymptomatic human immunodeficiency virus [HIV] infection status: Secondary | ICD-10-CM | POA: Diagnosis present

## 2014-12-26 DIAGNOSIS — F1310 Sedative, hypnotic or anxiolytic abuse, uncomplicated: Secondary | ICD-10-CM | POA: Insufficient documentation

## 2014-12-26 DIAGNOSIS — R45850 Homicidal ideations: Secondary | ICD-10-CM | POA: Diagnosis present

## 2014-12-26 DIAGNOSIS — F1010 Alcohol abuse, uncomplicated: Secondary | ICD-10-CM | POA: Diagnosis present

## 2014-12-26 DIAGNOSIS — K4090 Unilateral inguinal hernia, without obstruction or gangrene, not specified as recurrent: Secondary | ICD-10-CM | POA: Diagnosis present

## 2014-12-26 DIAGNOSIS — B20 Human immunodeficiency virus [HIV] disease: Secondary | ICD-10-CM | POA: Diagnosis present

## 2014-12-26 DIAGNOSIS — R410 Disorientation, unspecified: Secondary | ICD-10-CM | POA: Diagnosis not present

## 2014-12-26 DIAGNOSIS — Z720 Tobacco use: Secondary | ICD-10-CM | POA: Insufficient documentation

## 2014-12-26 LAB — COMPREHENSIVE METABOLIC PANEL
ALT: 7 U/L — ABNORMAL LOW (ref 17–63)
ANION GAP: 7 (ref 5–15)
AST: 19 U/L (ref 15–41)
Albumin: 4.1 g/dL (ref 3.5–5.0)
Alkaline Phosphatase: 76 U/L (ref 38–126)
BUN: 8 mg/dL (ref 6–20)
CALCIUM: 9.2 mg/dL (ref 8.9–10.3)
CHLORIDE: 106 mmol/L (ref 101–111)
CO2: 24 mmol/L (ref 22–32)
Creatinine, Ser: 0.84 mg/dL (ref 0.61–1.24)
GFR calc non Af Amer: >60 mL/min (ref >60)
Glucose, Bld: 126 mg/dL — ABNORMAL HIGH (ref 65–99)
Potassium: 3.7 mmol/L (ref 3.5–5.1)
SODIUM: 137 mmol/L (ref 135–145)
Total Bilirubin: 0.6 mg/dL (ref 0.3–1.2)
Total Protein: 6.6 g/dL (ref 6.5–8.1)

## 2014-12-26 LAB — URINALYSIS COMPLETE WITH MICROSCOPIC (ARMC ONLY)
BACTERIA UA: NONE SEEN
BILIRUBIN URINE: NEGATIVE
GLUCOSE, UA: NEGATIVE mg/dL
HGB URINE DIPSTICK: NEGATIVE
KETONES UR: NEGATIVE mg/dL
LEUKOCYTES UA: NEGATIVE
NITRITE: NEGATIVE
Protein, ur: NEGATIVE mg/dL
SPECIFIC GRAVITY, URINE: 1.009 (ref 1.005–1.030)
pH: 7 (ref 5.0–8.0)

## 2014-12-26 LAB — CBC
HCT: 42 % (ref 40.0–52.0)
HEMOGLOBIN: 13.8 g/dL (ref 13.0–18.0)
MCH: 31.8 pg (ref 26.0–34.0)
MCHC: 32.7 g/dL (ref 32.0–36.0)
MCV: 97 fL (ref 80.0–100.0)
PLATELETS: 217 10*3/uL (ref 150–440)
RBC: 4.33 MIL/uL — ABNORMAL LOW (ref 4.40–5.90)
RDW: 13.4 % (ref 11.5–14.5)
WBC: 4.9 10*3/uL (ref 3.8–10.6)

## 2014-12-26 LAB — URINE DRUG SCREEN, QUALITATIVE (ARMC ONLY)
Amphetamines, Ur Screen: NOT DETECTED
BARBITURATES, UR SCREEN: NOT DETECTED
BENZODIAZEPINE, UR SCRN: POSITIVE — AB
COCAINE METABOLITE, UR ~~LOC~~: NOT DETECTED
Cannabinoid 50 Ng, Ur ~~LOC~~: NOT DETECTED
MDMA (Ecstasy)Ur Screen: NOT DETECTED
METHADONE SCREEN, URINE: NOT DETECTED
Opiate, Ur Screen: NOT DETECTED
Phencyclidine (PCP) Ur S: NOT DETECTED
TRICYCLIC, UR SCREEN: NOT DETECTED

## 2014-12-26 LAB — ACETAMINOPHEN LEVEL

## 2014-12-26 LAB — ETHANOL: Alcohol, Ethyl (B): <5 mg/dL (ref <5)

## 2014-12-26 LAB — VALPROIC ACID LEVEL: Valproic Acid Lvl: 45 ug/mL — ABNORMAL LOW (ref 50.0–100.0)

## 2014-12-26 LAB — SALICYLATE LEVEL

## 2014-12-26 MED ORDER — TERAZOSIN HCL 1 MG PO CAPS
1.0000 mg | ORAL_CAPSULE | Freq: Every day | ORAL | Status: DC
  Administered 2014-12-27 – 2015-01-30 (×30): 1 mg via ORAL
  Filled 2014-12-26 (×33): qty 1

## 2014-12-26 MED ORDER — LEVOTHYROXINE SODIUM 50 MCG PO TABS
25.0000 ug | ORAL_TABLET | Freq: Every day | ORAL | Status: DC
  Administered 2014-12-27 – 2015-01-01 (×6): 25 ug via ORAL
  Administered 2015-01-02: 50 ug via ORAL
  Administered 2015-01-03 – 2015-01-31 (×27): 25 ug via ORAL
  Filled 2014-12-26 (×9): qty 1
  Filled 2014-12-26: qty 2
  Filled 2014-12-26 (×4): qty 1
  Filled 2014-12-26: qty 2
  Filled 2014-12-26 (×3): qty 1
  Filled 2014-12-26: qty 2
  Filled 2014-12-26 (×6): qty 1
  Filled 2014-12-26: qty 2
  Filled 2014-12-26: qty 1
  Filled 2014-12-26: qty 2
  Filled 2014-12-26 (×12): qty 1

## 2014-12-26 MED ORDER — VITAMIN D 1000 UNITS PO TABS
2000.0000 [IU] | ORAL_TABLET | Freq: Every day | ORAL | Status: DC
  Administered 2014-12-27 – 2015-01-07 (×12): 2000 [IU] via ORAL
  Administered 2015-01-08: 2 [IU] via ORAL
  Administered 2015-01-09 – 2015-01-10 (×2): 2000 [IU] via ORAL
  Filled 2014-12-26 (×16): qty 2

## 2014-12-26 MED ORDER — PNEUMOCOCCAL VAC POLYVALENT 25 MCG/0.5ML IJ INJ
0.5000 mL | INJECTION | INTRAMUSCULAR | Status: AC
  Administered 2014-12-27: 0.5 mL via INTRAMUSCULAR
  Filled 2014-12-26: qty 0.5

## 2014-12-26 MED ORDER — LEVOTHYROXINE SODIUM 25 MCG PO TABS: 25.0000 ug | ORAL_TABLET | Freq: Every day | ORAL | Status: DC

## 2014-12-26 MED ORDER — SIMVASTATIN 40 MG PO TABS
20.0000 mg | ORAL_TABLET | Freq: Every day | ORAL | Status: DC
  Administered 2014-12-26: 20 mg via ORAL
  Filled 2014-12-26: qty 1

## 2014-12-26 MED ORDER — ALUM & MAG HYDROXIDE-SIMETH 200-200-20 MG/5ML PO SUSP
30.0000 mL | ORAL | Status: DC | PRN
Start: 2014-12-26 — End: 2015-01-31
  Administered 2015-01-15: 30 mL via ORAL
  Filled 2014-12-26: qty 30

## 2014-12-26 MED ORDER — TERAZOSIN HCL 1 MG PO CAPS
1.0000 mg | ORAL_CAPSULE | Freq: Every day | ORAL | Status: DC
  Administered 2014-12-26: 1 mg via ORAL
  Filled 2014-12-26: qty 1

## 2014-12-26 MED ORDER — FLUPHENAZINE HCL 5 MG PO TABS
5.0000 mg | ORAL_TABLET | Freq: Two times a day (BID) | ORAL | Status: DC
  Administered 2014-12-27 – 2015-01-10 (×27): 5 mg via ORAL
  Filled 2014-12-26 (×29): qty 1

## 2014-12-26 MED ORDER — ASPIRIN EC 81 MG PO TBEC
81.0000 mg | DELAYED_RELEASE_TABLET | Freq: Every day | ORAL | Status: DC
  Administered 2014-12-27 – 2015-01-10 (×15): 81 mg via ORAL
  Filled 2014-12-26 (×16): qty 1

## 2014-12-26 MED ORDER — ACETAMINOPHEN 325 MG PO TABS
650.0000 mg | ORAL_TABLET | Freq: Four times a day (QID) | ORAL | Status: DC | PRN
  Administered 2015-01-10 – 2015-01-16 (×8): 650 mg via ORAL
  Filled 2014-12-26 (×9): qty 2

## 2014-12-26 MED ORDER — ASPIRIN EC 81 MG PO TBEC
81.0000 mg | DELAYED_RELEASE_TABLET | Freq: Every day | ORAL | Status: DC
  Administered 2014-12-26: 81 mg via ORAL
  Filled 2014-12-26 (×2): qty 1

## 2014-12-26 MED ORDER — INFLUENZA VAC SPLIT QUAD 0.5 ML IM SUSY
0.5000 mL | PREFILLED_SYRINGE | INTRAMUSCULAR | Status: AC
  Administered 2014-12-27: 0.5 mL via INTRAMUSCULAR
  Filled 2014-12-26: qty 0.5

## 2014-12-26 MED ORDER — DIVALPROEX SODIUM 250 MG PO DR TAB
250.0000 mg | DELAYED_RELEASE_TABLET | Freq: Every day | ORAL | Status: DC
  Administered 2014-12-26: 250 mg via ORAL
  Filled 2014-12-26: qty 1

## 2014-12-26 MED ORDER — VITAMIN D3 25 MCG (1000 UNIT) PO TABS: 2000.0000 [IU] | ORAL_TABLET | Freq: Every day | ORAL | Status: DC

## 2014-12-26 MED ORDER — SIMVASTATIN 20 MG PO TABS
20.0000 mg | ORAL_TABLET | Freq: Every day | ORAL | Status: DC
  Administered 2014-12-27 – 2015-01-27 (×25): 20 mg via ORAL
  Filled 2014-12-26 (×29): qty 1

## 2014-12-26 MED ORDER — CARVEDILOL 6.25 MG PO TABS
3.1250 mg | ORAL_TABLET | Freq: Two times a day (BID) | ORAL | Status: DC
  Administered 2014-12-27 – 2015-01-19 (×42): 3.125 mg via ORAL
  Administered 2015-01-19: 3.25 mg via ORAL
  Administered 2015-01-20 – 2015-01-28 (×13): 3.125 mg via ORAL
  Administered 2015-01-29: 08:00:00 via ORAL
  Filled 2014-12-26 (×17): qty 1
  Filled 2014-12-26: qty 2
  Filled 2014-12-26 (×10): qty 1
  Filled 2014-12-26 (×2): qty 2
  Filled 2014-12-26 (×33): qty 1

## 2014-12-26 MED ORDER — EFAVIRENZ-EMTRICITAB-TENOFOVIR 600-200-300 MG PO TABS
1.0000 | ORAL_TABLET | Freq: Every day | ORAL | Status: DC
  Administered 2014-12-27 – 2015-01-30 (×31): 1 via ORAL
  Filled 2014-12-26 (×33): qty 1

## 2014-12-26 MED ORDER — CARVEDILOL 6.25 MG PO TABS: 3.1250 mg | ORAL_TABLET | Freq: Two times a day (BID) | ORAL | Status: DC

## 2014-12-26 MED ORDER — BENZTROPINE MESYLATE 0.5 MG PO TABS
1.0000 mg | ORAL_TABLET | Freq: Every day | ORAL | Status: DC
  Administered 2014-12-26: 1 mg via ORAL
  Filled 2014-12-26: qty 2

## 2014-12-26 MED ORDER — BENZTROPINE MESYLATE 1 MG PO TABS: 1.0000 mg | ORAL_TABLET | Freq: Every day | ORAL | Status: DC

## 2014-12-26 MED ORDER — DIVALPROEX SODIUM 250 MG PO DR TAB: 250.0000 mg | DELAYED_RELEASE_TABLET | Freq: Every day | ORAL | Status: DC

## 2014-12-26 MED ORDER — LITHIUM CARBONATE ER 300 MG PO TBCR
600.0000 mg | EXTENDED_RELEASE_TABLET | Freq: Two times a day (BID) | ORAL | Status: DC
  Administered 2014-12-27 – 2015-01-02 (×13): 600 mg via ORAL
  Filled 2014-12-26 (×13): qty 2

## 2014-12-26 MED ORDER — LITHIUM CARBONATE ER 300 MG PO TBCR
600.0000 mg | EXTENDED_RELEASE_TABLET | Freq: Two times a day (BID) | ORAL | Status: DC
  Administered 2014-12-26: 600 mg via ORAL
  Filled 2014-12-26: qty 2

## 2014-12-26 MED ORDER — TAMSULOSIN HCL 0.4 MG PO CAPS
0.4000 mg | ORAL_CAPSULE | Freq: Every day | ORAL | Status: DC
  Administered 2014-12-27 – 2015-01-27 (×25): 0.4 mg via ORAL
  Filled 2014-12-26 (×30): qty 1

## 2014-12-26 MED ORDER — MAGNESIUM HYDROXIDE 400 MG/5ML PO SUSP
30.0000 mL | Freq: Every day | ORAL | Status: DC | PRN
  Administered 2015-01-10 – 2015-01-30 (×6): 30 mL via ORAL
  Filled 2014-12-26 (×6): qty 30

## 2014-12-26 MED ORDER — EFAVIRENZ-EMTRICITAB-TENOFOVIR 600-200-300 MG PO TABS
1.0000 | ORAL_TABLET | Freq: Every day | ORAL | Status: DC
  Administered 2014-12-26: 1 via ORAL
  Filled 2014-12-26: qty 1

## 2014-12-26 MED ORDER — FLUPHENAZINE HCL 5 MG PO TABS
5.0000 mg | ORAL_TABLET | Freq: Two times a day (BID) | ORAL | Status: DC
  Administered 2014-12-26: 5 mg via ORAL
  Filled 2014-12-26 (×2): qty 1

## 2014-12-26 MED ORDER — TAMSULOSIN HCL 0.4 MG PO CAPS
0.4000 mg | ORAL_CAPSULE | Freq: Every day | ORAL | Status: DC
  Administered 2014-12-26: 0.4 mg via ORAL
  Filled 2014-12-26: qty 1

## 2014-12-26 NOTE — ED Notes (Signed)
Gave pt. Meal tray.  Pt. Was hungry and medication given(prolixin) indicated with meal

## 2014-12-26 NOTE — ED Notes (Signed)
BEHAVIORAL HEALTH ROUNDING Patient sleeping: No. Patient alert and oriented: yes Behavior appropriate: Yes.  ; If no, describe: 3 Nutrition and fluids offered: Yes  Toileting and hygiene offered: Yes  Sitter present: no Law enforcement present: Yes
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
## 2014-12-26 NOTE — ED Notes (Signed)

## 2014-12-26 NOTE — ED Notes (Signed)
BEHAVIORAL HEALTH ROUNDING Patient sleeping: No. Patient alert and oriented: unable to assess  - pt will not talk to me - wants md Behavior appropriate: No.; If no, describe: see above Nutrition and fluids offered: Yes  Toileting and hygiene offered: Yes  Sitter present: no Law enforcement present: Yes
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
## 2014-12-26 NOTE — ED Notes (Signed)
BEHAVIORAL HEALTH ROUNDING Patient sleeping: Yes.   Patient alert and oriented: not applicable Behavior appropriate: Yes.  ; If no, describe:  Nutrition and fluids offered: Yes  Toileting and hygiene offered: Yes  Sitter present: no Law enforcement present: Yes  

## 2014-12-26 NOTE — ED Notes (Signed)
BEHAVIORAL HEALTH ROUNDING Patient sleeping: No. Patient alert and oriented: yes Behavior appropriate: Yes.  ; If no, describe:  Nutrition and fluids offered: Yes  Toileting and hygiene offered: Yes  Sitter present: no Law enforcement present: Yes  

## 2014-12-26 NOTE — ED Notes (Signed)
Ems from [facility]. Per ems, pt called ems for suicidal thoughts. However, pt will only say that he will only talk to md. Will not answer any questions.

## 2014-12-26 NOTE — ED Notes (Signed)
.  [facility] 

## 2014-12-26 NOTE — ED Notes (Signed)
Called to give report in behavior medicine.  Nurse requested to wait till 22:00, giving meds at this time.

## 2014-12-26 NOTE — ED Notes (Signed)
Pt. Being discharged from ed to Behavior med. Downstairs.

## 2014-12-26 NOTE — ED Provider Notes (Signed)
[facility] Emergency Department Provider Note  ____________________________________________  Time seen: Approximately 220PM  I have reviewed the triage vital signs and the nursing notes.   HISTORY  Chief Complaint Psychiatric Evaluation    HPI [name] is a 54 y.o. male with a history of schizoaffective disorder and HIV who is presenting today with suicidal thoughts. He does not really have any specific plan to kill himself. He says the multiple things are causing and stress and suicidal thoughts including not being able to get in touch with his caseworker. He has not had any ingestion.   Past Medical History  Diagnosis Date  . HIV (human immunodeficiency virus infection) (HCC)   . BPH (benign prostatic hypertrophy)   . Dyslipidemia   . Hypertension   . Hypothyroidism   . Schizoaffective disorder [facility])     Patient Active Problem List   Diagnosis Date Noted  . Schizoaffective disorder (HCC) 10/25/2014  . Alcohol abuse 10/25/2014    No past surgical history on file.  Current Outpatient Rx  Name  Route  Sig  Dispense  Refill  . aspirin EC 81 MG tablet   Oral   Take 81 mg by mouth daily.         . benztropine (COGENTIN) 1 MG tablet   Oral   Take 1 mg by mouth at bedtime.         . carvedilol (COREG) 3.125 MG tablet   Oral   Take 3.125 mg by mouth 2 (two) times daily.          . Cholecalciferol (VITAMIN D) 2000 UNITS CAPS   Oral   Take 2,000 Units by mouth daily.         . divalproex (DEPAKOTE ER) 250 MG 24 hr tablet   Oral   Take 250 mg by mouth at bedtime.         . docusate sodium (COLACE) 100 MG capsule   Oral   Take 100 mg by mouth at bedtime.          . fluPHENAZine (PROLIXIN) 5 MG tablet   Oral   Take 5 mg by mouth 2 (two) times daily.          Marland Kitchen levothyroxine (SYNTHROID, LEVOTHROID) 25 MCG tablet   Oral   Take 25 mcg by mouth daily.          Marland Kitchen lithium carbonate 300 MG capsule   Oral   Take 600 mg by  mouth 2 (two) times daily.         . paliperidone (INVEGA SUSTENNA) 234 MG/1.5ML SUSP injection   Intramuscular   Inject 234 mg into the muscle every 30 (thirty) days.         . simvastatin (ZOCOR) 20 MG tablet   Oral   Take 20 mg by mouth at bedtime.          . tamsulosin (FLOMAX) 0.4 MG CAPS capsule   Oral   Take 0.4 mg by mouth at bedtime.         Marland Kitchen terazosin (HYTRIN) 1 MG capsule   Oral   Take 1 mg by mouth at bedtime.           Allergies Review of patient's allergies indicates no known allergies.  No family history on file.  Social History Social History  Substance Use Topics  . Smoking status: Current Every Day Smoker -- 2.00 packs/day    Types: Cigarettes  . Smokeless tobacco: Not on file  .  Alcohol Use: 1.2 - 1.8 oz/week    2-3 Cans of beer per week    Review of Systems Constitutional: No fever/chills Eyes: No visual changes. ENT: No sore throat. Cardiovascular: Denies chest pain. Respiratory: Denies shortness of breath. Gastrointestinal: No abdominal pain.  No nausea, no vomiting.  No diarrhea.  No constipation. Genitourinary: Negative for dysuria. Musculoskeletal: Negative for back pain. Skin: Negative for rash. Neurological: Negative for headaches, focal weakness or numbness.  10-point ROS otherwise negative.  ____________________________________________   PHYSICAL EXAM:  VITAL SIGNS: ED Triage Vitals  Enc Vitals Group     BP 12/26/14 1214 126/82 mmHg     Pulse Rate 12/26/14 1214 78     Resp --      Temp 12/26/14 1214 97.9 F (36.6 C)     Temp Source 12/26/14 1214 Oral     SpO2 12/26/14 1214 97 %     Weight 12/26/14 1214 160 lb (72.576 kg)     Height 12/26/14 1214 6' (1.829 m)     Head Cir --      Peak Flow --      Pain Score 12/26/14 1216 10     Pain Loc --      Pain Edu? --      Excl. in GC? --     Constitutional: Alert and oriented. Well appearing and in no acute distress. Eyes: Conjunctivae are normal. PERRL.  EOMI. Head: Atraumatic. Nose: No congestion/rhinnorhea. Mouth/Throat: Mucous membranes are moist.  Oropharynx non-erythematous. Neck: No stridor.   Cardiovascular: Normal rate, regular rhythm. Grossly normal heart sounds.  Good peripheral circulation. Respiratory: Normal respiratory effort.  No retractions. Lungs CTAB. Gastrointestinal: Soft and nontender. No distention. No abdominal bruits. No CVA tenderness. Musculoskeletal: No lower extremity tenderness nor edema.  No joint effusions. Neurologic:  Normal speech and language. No gross focal neurologic deficits are appreciated. No gait instability. Skin:  Skin is warm, dry and intact. No rash noted. Psychiatric: Tangential with flat affect. ____________________________________________   LABS (all labs ordered are listed, but only abnormal results are displayed)  Labs Reviewed  COMPREHENSIVE METABOLIC PANEL - Abnormal; Notable for the following:    Glucose, Bld 126 (*)    ALT 7 (*)    All other components within normal limits  ACETAMINOPHEN LEVEL - Abnormal; Notable for the following:    Acetaminophen (Tylenol), Serum <10 (*)    All other components within normal limits  CBC - Abnormal; Notable for the following:    RBC 4.33 (*)    All other components within normal limits  ETHANOL  SALICYLATE LEVEL  URINE DRUG SCREEN, QUALITATIVE ([facility] ONLY)  URINALYSIS COMPLETEWITH MICROSCOPIC ([facility] ONLY)  VALPROIC ACID LEVEL   ____________________________________________  EKG   ____________________________________________  RADIOLOGY   ____________________________________________   PROCEDURES    ____________________________________________   INITIAL IMPRESSION / ASSESSMENT AND PLAN / ED COURSE  Pertinent labs & imaging results that were available during my care of the patient were reviewed by me and considered in my medical decision making (see chart for details).  We'll commit patient and have him speak to the  psychiatrist. Patient understands this plan and is willing to comply. Will order medications including HIV meds. ____________________________________________   FINAL CLINICAL IMPRESSION(S) / ED DIAGNOSES  Suicidal ideation.    Myrna Blazer, MD 12/26/14 [phone]

## 2014-12-27 DIAGNOSIS — F17200 Nicotine dependence, unspecified, uncomplicated: Secondary | ICD-10-CM | POA: Diagnosis present

## 2014-12-27 LAB — LIPID PANEL
CHOLESTEROL: 114 mg/dL (ref 0–200)
HDL: 56 mg/dL (ref >40)
LDL CALC: 51 mg/dL (ref 0–99)
TRIGLYCERIDES: 33 mg/dL (ref <150)
Total CHOL/HDL Ratio: 2 RATIO
VLDL: 7 mg/dL (ref 0–40)

## 2014-12-27 LAB — TSH: TSH: 0.923 u[IU]/mL (ref 0.350–4.500)

## 2014-12-27 MED ORDER — DIVALPROEX SODIUM 500 MG PO DR TAB
500.0000 mg | DELAYED_RELEASE_TABLET | Freq: Three times a day (TID) | ORAL | Status: DC
  Administered 2014-12-27: 500 mg via ORAL
  Filled 2014-12-27: qty 1

## 2014-12-27 MED ORDER — PALIPERIDONE ER 3 MG PO TB24
6.0000 mg | ORAL_TABLET | Freq: Every day | ORAL | Status: DC
  Administered 2014-12-27 – 2015-01-09 (×12): 6 mg via ORAL
  Filled 2014-12-27 (×13): qty 2

## 2014-12-27 MED ORDER — BENZTROPINE MESYLATE 1 MG PO TABS
1.0000 mg | ORAL_TABLET | Freq: Every day | ORAL | Status: DC
  Administered 2014-12-27 – 2015-01-02 (×7): 1 mg via ORAL
  Filled 2014-12-27 (×7): qty 1

## 2014-12-27 MED ORDER — PALIPERIDONE PALMITATE 234 MG/1.5ML IM SUSP
234.0000 mg | INTRAMUSCULAR | Status: DC
  Administered 2015-01-01 – 2015-01-29 (×2): 234 mg via INTRAMUSCULAR
  Filled 2014-12-27 (×5): qty 1.5
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
## 2014-12-27 NOTE — Progress Notes (Signed)
Observed walking around in the milieu, appearance: well groomed in street clothes, receptive but handed me a-3-page notes when we got to his room to talk about how his day went, "read that and come and talk with me after.Marland KitchenMarland Kitchen"
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
## 2014-12-27 NOTE — [facility] Group Notes (Signed)
[facility] Group Notes:  (Nursing/MHT/Case Management/Adjunct)  Date:  12/27/2014  Time:  1:54 PM  Type of Therapy:  Psychoeducational Skills  Participation Level:  Minimal  Participation Quality:  Inattentive  Affect:  Irritable  Cognitive:  Disorganized  Insight:  Lacking  Engagement in Group:  Monopolizing and Off Topic  Modes of Intervention:  Discussion and Education  Summary of Progress/Problems:  Marquette Old 12/27/2014, 1:54 PM

## 2014-12-27 NOTE — [facility] Group Notes (Signed)
[facility] LCSW Group Therapy  12/27/2014 2:22 PM  Type of Therapy:  Group Therapy  Participation Level:  Minimal   Participation Quality:  Attentive  Affect:  Labile  Cognitive:  Disorganized  Insight:  Limited  Engagement in Therapy:  Limited  Modes of Intervention:  Discussion, Education, Socialization and Support  Summary of Progress/Problems: Balance in life: Patients will discuss the concept of balance and how it looks and feels to be unbalanced. Pt will identify areas in their life that is unbalanced and ways to become more balanced.  [name] attended group and stayed the entire time. He was disorganized and labile. He states he is worried his ACT team is going to discharge him because he wants to sue them and take over the world. However, he states he needs them to help him.   Buna Cuppett L Shanae Luo MSW, LCSWA  12/27/2014, 2:22 PM

## 2014-12-27 NOTE — Tx Team (Signed)
Initial Interdisciplinary Treatment Plan   PATIENT STRESSORS: Financial difficulties Health problems Medication change or noncompliance Substance abuse   PATIENT STRENGTHS: Ability for insight Capable of independent living Motivation for treatment/growth   PROBLEM LIST: Problem List/Patient Goals Date to be addressed Date deferred Reason deferred Estimated date of resolution  Suicidal Ideation 12/26/2014     Mood Liability 12/26/2014     Alcohol Abuse 12/26/2014     Depressive Symptoms 12/26/2014     Medications NonCompliant 12/26/2014                              DISCHARGE CRITERIA:  Adequate post-discharge living arrangements Improved stabilization in mood, thinking, and/or behavior Motivation to continue treatment in a less acute level of care Need for constant or close observation no longer present Safe-care adequate arrangements made Verbal commitment to aftercare and medication compliance  PRELIMINARY DISCHARGE PLAN: Attend aftercare/continuing care group Outpatient therapy Return to previous living arrangement  PATIENT/FAMIILY INVOLVEMENT: This treatment plan has been presented to and reviewed with the patient, [name].  The patient have been given the opportunity to ask questions and make suggestions.  Grayden Burley T Swayze Kozuch 12/27/2014, 12:28 AM

## 2014-12-27 NOTE — Plan of Care (Signed)
Problem: Ineffective individual coping Goal: STG-Increase in ability to manage activities of daily living Outcome: Progressing Appropriate behavior compliant with unit programing
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
## 2014-12-27 NOTE — Progress Notes (Signed)
D: Patient has wrote noted to writer about  His needs all shift . Patient has threaten about  wanting to go outside and play basketball. Patient adamant about  Not returning to group home , but  Later decided he wanted to return , but later switch back. Delusional with demands .   Limited participation with unit programing  . Appropriate ADL A;ppetite good and voice no concern around sleep.  A: Encourage patient participation . Instructions given on medication  Verbalized understanding R: Voice no other concerns

## 2014-12-27 NOTE — Progress Notes (Signed)
Patient not available for visit. In group. Staff to place another request at a later date when patient is availble.

## 2014-12-27 NOTE — [facility] Suicide Risk Assessment (Signed)
[facility] Admission Suicide Risk Assessment   Nursing information obtained from:    Demographic factors:    Current Mental Status:    Loss Factors:    Historical Factors:    Risk Reduction Factors:    Total Time spent with patient: 1 hour Principal Problem: Schizoaffective disorder, bipolar type (HCC) Diagnosis:   Patient Active Problem List   Diagnosis Date Noted  . Tobacco use disorder [F17.200] 12/27/2014  . HIV positive (HCC) [Z21] 12/26/2014  . Hypertension [I10] 12/26/2014  . Hypothyroidism [E03.9] 12/26/2014  . Dyslipidemia [E78.5] 12/26/2014  . Schizoaffective disorder, bipolar type (HCC) [F25.0] 10/25/2014  . Alcohol abuse [F10.10] 10/25/2014     Continued Clinical Symptoms:  Alcohol Use Disorder Identification Test Final Score (AUDIT): 6 The "Alcohol Use Disorders Identification Test", Guidelines for Use in Primary Care, Second Edition.  World Science writer [facility]). Score between 0-7:  no or low risk or alcohol related problems. Score between 8-15:  moderate risk of alcohol related problems. Score between 16-19:  high risk of alcohol related problems. Score 20 or above:  warrants further diagnostic evaluation for alcohol dependence and treatment.   CLINICAL FACTORS:   Bipolar Disorder:   Mixed State   Musculoskeletal: Strength & Muscle Tone: within normal limits Gait & Station: normal Patient leans: N/A  Psychiatric Specialty Exam: Physical Exam  Nursing note and vitals reviewed. Constitutional: He is oriented to person, place, and time. He appears well-developed and well-nourished.  HENT:  Head: Normocephalic and atraumatic.  Eyes: Conjunctivae and EOM are normal. Pupils are equal, round, and reactive to light.  Neck: Normal range of motion. Neck supple.  Cardiovascular: Normal rate, regular rhythm and normal heart sounds.   Respiratory: Effort normal and breath sounds normal.  GI: Soft. Bowel sounds are normal.  Musculoskeletal: Normal range of motion.   Neurological: He is alert and oriented to person, place, and time.  Skin: Skin is warm and dry.    Review of Systems  All other systems reviewed and are negative.   Blood pressure 128/81, pulse 77, temperature 97.3 F (36.3 C), temperature source Oral, resp. rate 18, height 6' (1.829 m), weight 79.379 kg (175 lb), SpO2 100 %.Body mass index is 23.73 kg/(m^2).  General Appearance: Casual  Eye Contact::  Good  Speech:  Clear and Coherent  Volume:  Normal  Mood:  Euphoric  Affect:  Labile  Thought Process:  Goal Directed  Orientation:  Full (Time, Place, and Person)  Thought Content:  Hallucinations: Auditory  Suicidal Thoughts:  No  Homicidal Thoughts:  No  Memory:  Immediate;   Fair Recent;   Fair Remote;   Fair  Judgement:  Poor  Insight:  Shallow  Psychomotor Activity:  Normal  Concentration:  Fair  Recall:  [employer] of Knowledge:Fair  Language: Fair  Akathisia:  No  Handed:  Right  AIMS (if indicated):     Assets:  Communication Skills Desire for Improvement Financial Resources/Insurance Resilience  Sleep:  Number of Hours: 2  Cognition: WNL  ADL's:  Intact     COGNITIVE FEATURES THAT CONTRIBUTE TO RISK:  None    SUICIDE RISK:   Minimal: No identifiable suicidal ideation.  Patients presenting with no risk factors but with morbid ruminations; may be classified as minimal risk based on the severity of the depressive symptoms  PLAN OF CARE: Hospital admission, medication management, discharge planning.  Medical Decision Making:  New problem, with additional work up planned, Review of Psycho-Social Stressors (1), Review or order clinical lab  tests (1), Review of Medication Regimen & Side Effects (2) and Review of New Medication or Change in Dosage (2)   Mr. Roher is a 54 year old male with long history of schizoaffective disorder admitted for agitated and threatening behavior at the group home in the context of medication noncompliance.  1. Agitation. This has  resolved.  2. Mood and psychosis. We restarted lithium and Depakote for mood stabilization and Prolixin for psychosis. The patient has been maintained on [place] injections. The date of last injection is unknown we will contact his ACT team.  3. Hypothyroidism. We'll continue Synthroid.  4. HIV. We will continue Atripla.  5. Hypertension. We will continue antihypertensives.  6. Dyslipidemia. We'll continue Zocor.\  7. BPH. We will continue Flomax.  8. Social. The patient is not allowed to return to his group home placement a must.  0. Disposition. To be established.   I certify that inpatient services furnished can reasonably be expected to improve the patient's condition.   Jolanta Pucilowska 12/27/2014, 10:15 AM

## 2014-12-28 NOTE — Plan of Care (Signed)
Problem: Ineffective individual coping Goal: STG: Patient will remain free from self harm Outcome: Progressing Medications (Atripla 1 Tablet, Hytrin 1 mg Capsule, Lithium Carbonate 600 mg  24 hour and Invega 6 mg) administered as ordered by the physician, medications Therapeutic Effects, SEs and Adverse effects discussed, questions encouraged; no PRN given, 15 minute checks maintained for safety, clinical and moral support provided, patient encouraged to continue to express feelings and demonstrate safe care. Patient remain free from harm, will continue to monitor.

## 2014-12-28 NOTE — [facility] Group Notes (Signed)
[facility] Group Notes:  (Nursing/MHT/Case Management/Adjunct)  Date:  12/28/2014  Time:  6:11 PM  Type of Therapy:  Psychoeducational Skills  Participation Level:  Minimal  Participation Quality:  Attentive  Affect:  Blunted  Cognitive:  Appropriate  Insight:  Improving  Engagement in Group:  Developing/Improving  Modes of Intervention:  Clarification and Discussion  Summary of Progress/Problems:  [name] 12/28/2014, 6:11 PM

## 2014-12-28 NOTE — Progress Notes (Signed)
[facility] MD Progress Note  12/28/2014 12:10 PM [name]  MRN:  [id number]  Subjective: [name] has a long history of schizoaffective disorder admitted for agitated and threatening behavior in the context of treatment compliance.  [name] is still hypomanic. He wrote me 20 pages long note. He is grandiose and somewhat disorganized. In one sentence she tells me that he wants to live independently and he is thinking about going to a group home which he initially refused. He speech is pressured. He is very talkative and hard to redirect. He accepts medications and tolerates them well. He has no somatic complaints.    Principal Problem: Schizoaffective disorder, bipolar type (HCC) Diagnosis:   Patient Active Problem List   Diagnosis Date Noted  . Tobacco use disorder [F17.200] 12/27/2014  . HIV positive (HCC) [Z21] 12/26/2014  . Hypertension [I10] 12/26/2014  . Hypothyroidism [E03.9] 12/26/2014  . Dyslipidemia [E78.5] 12/26/2014  . Schizoaffective disorder, bipolar type (HCC) [F25.0] 10/25/2014  . Alcohol abuse [F10.10] 10/25/2014   Total Time spent with patient: 20 minutes  Past Psychiatric History: Long history of depression and substance use.  Past Medical History:  Past Medical History  Diagnosis Date  . HIV (human immunodeficiency virus infection) (HCC)   . BPH (benign prostatic hypertrophy)   . Dyslipidemia   . Hypertension   . Hypothyroidism   . Schizoaffective disorder (HCC)    History reviewed. No pertinent past surgical history. Family History: History reviewed. No pertinent family history. Family Psychiatric  History: None reported. Social History:  History  Alcohol Use  . 1.2 - 1.8 oz/week  . 2-3 Cans of beer per week     History  Drug Use No    Social History   Social History  . Marital Status: Single    Spouse Name: N/A  . Number of Children: N/A  . Years of Education: N/A   Social History Main Topics  . Smoking status: Current Every Day Smoker --  2.00 packs/day    Types: Cigarettes  . Smokeless tobacco: None  . Alcohol Use: 1.2 - 1.8 oz/week    2-3 Cans of beer per week  . Drug Use: No  . Sexual Activity: Not Asked   Other Topics Concern  . None   Social History Narrative   Additional Social History:                         Sleep: Good  Appetite:  Poor  Current Medications: Current Facility-Administered Medications  Medication Dose Route Frequency Provider Last Rate Last Dose  . acetaminophen (TYLENOL) tablet 650 mg  650 mg Oral Q6H PRN Audery Amel, MD      . alum & mag hydroxide-simeth (MAALOX/MYLANTA) 200-200-20 MG/5ML suspension 30 mL  30 mL Oral Q4H PRN Audery Amel, MD      . aspirin EC tablet 81 mg  81 mg Oral Daily Audery Amel, MD   81 mg at 12/28/14 0939  . benztropine (COGENTIN) tablet 1 mg  1 mg Oral Daily Shari Prows, MD   1 mg at 12/28/14 0939  . carvedilol (COREG) tablet 3.125 mg  3.125 mg Oral BID WC Audery Amel, MD   3.125 mg at 12/28/14 0815  . cholecalciferol (VITAMIN D) tablet 2,000 Units  2,000 Units Oral Daily Audery Amel, MD   2,000 Units at 12/28/14 [phone]  . efavirenz-emtricitabine-tenofovir (ATRIPLA) 600-200-300 MG per tablet 1 tablet  1 tablet Oral QHS John T  Clapacs, MD   1 tablet at 12/27/14 2131  . fluPHENAZine (PROLIXIN) tablet 5 mg  5 mg Oral BID AC Audery Amel, MD   5 mg at 12/28/14 0815  . levothyroxine (SYNTHROID, LEVOTHROID) tablet 25 mcg  25 mcg Oral QAC breakfast Audery Amel, MD   25 mcg at 12/28/14 [phone]  . lithium carbonate (LITHOBID) CR tablet 600 mg  600 mg Oral Q12H Audery Amel, MD   600 mg at 12/28/14 0939  . magnesium hydroxide (MILK OF MAGNESIA) suspension 30 mL  30 mL Oral Daily PRN Audery Amel, MD      . Melene Muller ON 01/01/2015] paliperidone (INVEGA SUSTENNA) injection 234 mg  234 mg Intramuscular Q28 days Mallary Kreger B Donalyn Schneeberger, MD      . paliperidone (INVEGA) 24 hr tablet 6 mg  6 mg Oral QHS Jahon Bart B Uchenna Seufert, MD   6 mg at 12/27/14 2131   . simvastatin (ZOCOR) tablet 20 mg  20 mg Oral q1800 Audery Amel, MD   20 mg at 12/27/14 1736  . tamsulosin (FLOMAX) capsule 0.4 mg  0.4 mg Oral QPC supper Audery Amel, MD   0.4 mg at 12/27/14 1736  . terazosin (HYTRIN) capsule 1 mg  1 mg Oral QHS Audery Amel, MD   1 mg at 12/27/14 2131    Lab Results:  Results for orders placed or performed during the hospital encounter of 12/26/14 (from the past 48 hour(s))  Lipid panel, fasting     Status: None   Collection Time: 12/27/14  7:09 AM  Result Value Ref Range   Cholesterol 114 0 - 200 mg/dL   Triglycerides 33 <960 mg/dL   HDL 56 >45 mg/dL   Total CHOL/HDL Ratio 2.0 RATIO   VLDL 7 0 - 40 mg/dL   LDL Cholesterol 51 0 - 99 mg/dL    Comment:        Total Cholesterol/HDL:CHD Risk Coronary Heart Disease Risk Table                     Men   Women  1/2 Average Risk   3.4   3.3  Average Risk       5.0   4.4  2 X Average Risk   9.6   7.1  3 X Average Risk  23.4   11.0        Use the calculated Patient Ratio above and the CHD Risk Table to determine the patient's CHD Risk.        ATP III CLASSIFICATION (LDL):  <100     mg/dL   Optimal  409-811  mg/dL   Near or Above                    Optimal  130-159  mg/dL   Borderline  914-782  mg/dL   High  >956     mg/dL   Very High   TSH     Status: None   Collection Time: 12/27/14  7:09 AM  Result Value Ref Range   TSH 0.923 0.350 - 4.500 uIU/mL    Physical Findings: AIMS:  , ,  ,  ,    CIWA:    COWS:     Musculoskeletal: Strength & Muscle Tone: within normal limits Gait & Station: normal Patient leans: N/A  Psychiatric Specialty Exam: Review of Systems  All other systems reviewed and are negative.   Blood pressure 104/72, pulse 88, temperature 97.3 F (36.3  C), temperature source Oral, resp. rate 20, height 6' (1.829 m), weight 79.379 kg (175 lb), SpO2 100 %.Body mass index is 23.73 kg/(m^2).  General Appearance: Casual  Eye Contact::  Good  Speech:  Pressured and  Slurred  Volume:  Increased  Mood:  Euphoric  Affect:  Labile  Thought Process:  Goal Directed  Orientation:  Full (Time, Place, and Person)  Thought Content:  WDL  Suicidal Thoughts:  No  Homicidal Thoughts:  No  Memory:  Immediate;   Fair Recent;   Fair Remote;   Fair  Judgement:  Fair  Insight:  Fair  Psychomotor Activity:  Normal  Concentration:  Fair  Recall:  [employer] of Knowledge:Fair  Language: Fair  Akathisia:  No  Handed:  Right  AIMS (if indicated):     Assets:  Communication Skills Desire for Improvement Financial Resources/Insurance  ADL's:  Intact  Cognition: WNL  Sleep:  Number of Hours: 7   Treatment Plan Summary: Daily contact with patient to assess and evaluate symptoms and progress in treatment and Medication management   [name] is a 54 year old male with long history of schizoaffective disorder admitted for agitated and threatening behavior at the group home in the context of medication noncompliance.  1. Agitation. This has resolved.  2. Mood and psychosis. We restarted lithium for mood stabilization and Prolixin for psychosis. The patient has been maintained on [place] injections. His next shot is due on 01/01/2015. We will also offer Prolixin Decanoate to improve compliance.  3. Hypothyroidism. We'll continue Synthroid.  4. HIV. We will continue Atripla.  5. Hypertension. We will continue antihypertensives.  6. Dyslipidemia. We'll continue Zocor.  7. BPH. We will continue Flomax.  8. Social. The patient is not allowed to return to his group home. Placement a must.  9. Disposition. To be established.   Mykenna Viele 12/28/2014, 12:10 PM

## 2014-12-28 NOTE — [facility] Group Notes (Signed)
[facility] LCSW Aftercare Discharge Planning Group Note   12/28/2014 2:17 PM  Participation Quality:  Pt attended group briefly. When asked a question, he walked out.   Maddyx Vallie L Kupono Marling MSW, [address]

## 2014-12-28 NOTE — Progress Notes (Signed)
Patient is alert and oriented no signs of pain or acute distress noted. Patient is liable and has been anxious about getting his clothes from the ACT time.  Patient interacting well with staff. Patient has been very anxious today and at time was in his room yelling and screaming.  Patient was able to be redirected and calm.  Patient has also written several letters to SW and other member of his healthcare team.  Patient denies SI/ HI AVH at this time.  Patient attended group session for a brief period.  Will continue to monitor throughout the remainder of the shift.

## 2014-12-28 NOTE — Plan of Care (Signed)
Problem: Consults Goal: [facility] General Treatment Patient Education Outcome: Progressing Pt is working towards achieving this goal.

## 2014-12-29 DIAGNOSIS — F250 Schizoaffective disorder, bipolar type: Principal | ICD-10-CM

## 2014-12-29 NOTE — Progress Notes (Signed)
Pt in a lablile mood and affect this evening, visible in milieu, attended HS group, continues to write up notes. Pt's said he's feeling much better from his anger earlier on related to his commitment. Pt remains compliant with medications, no concerns voiced, safety maintained.

## 2014-12-29 NOTE — Progress Notes (Signed)
[facility] MD Progress Note  12/29/2014 2:54 PM [name]  MRN:  [id number]  Subjective: Follow-up note for 54 year old man with schizoaffective disorder admitted to the hospital because of repeated agitated behavior making it impossible for him to survive safely outside a hospital setting at this time. Patient interviewed today. Chart reviewed. Vitals reviewed. Labs reviewed. Old records reviewed. Patient states he is still feeling nervous and anxious. He says he is troubled by believing that he will not be able to survive outside hospital. He tells me today that he [facility]. I pointed out to him that that was probably not a long-term solution and encouraged him to think about what he really wants to change about his life and how he will be able to make it more independently.  Patient is not reporting suicidal ideation. He has not been threatening or hostile. He does remain hyperverbal and somewhat grandiose and writes a great many pages of notes a day that he gives to all of his treatment providers but doesn't appear to be otherwise obviously psychotic. Tolerating medicine  Blood pressure under good control. Tolerating his current medicine well.  Principal Problem: Schizoaffective disorder, bipolar type (HCC) Diagnosis:   Patient Active Problem List   Diagnosis Date Noted  . Tobacco use disorder [F17.200] 12/27/2014  . HIV positive (HCC) [Z21] 12/26/2014  . Hypertension [I10] 12/26/2014  . Hypothyroidism [E03.9] 12/26/2014  . Dyslipidemia [E78.5] 12/26/2014  . Schizoaffective disorder, bipolar type (HCC) [F25.0] 10/25/2014  . Alcohol abuse [F10.10] 10/25/2014   Total Time spent with patient: 20 minutes  Past Psychiatric History: Long history of depression and substance use.  Past Medical History:  Past Medical History  Diagnosis Date  . HIV (human immunodeficiency virus infection) (HCC)   . BPH (benign prostatic hypertrophy)   . Dyslipidemia   .  Hypertension   . Hypothyroidism   . Schizoaffective disorder (HCC)    History reviewed. No pertinent past surgical history. Family History: History reviewed. No pertinent family history. Family Psychiatric  History: None reported. Social History:  History  Alcohol Use  . 1.2 - 1.8 oz/week  . 2-3 Cans of beer per week     History  Drug Use No    Social History   Social History  . Marital Status: Single    Spouse Name: N/A  . Number of Children: N/A  . Years of Education: N/A   Social History Main Topics  . Smoking status: Current Every Day Smoker -- 2.00 packs/day    Types: Cigarettes  . Smokeless tobacco: None  . Alcohol Use: 1.2 - 1.8 oz/week    2-3 Cans of beer per week  . Drug Use: No  . Sexual Activity: Not Asked   Other Topics Concern  . None   Social History Narrative   Additional Social History:                         Sleep: Good  Appetite:  Poor  Current Medications: Current Facility-Administered Medications  Medication Dose Route Frequency Provider Last Rate Last Dose  . acetaminophen (TYLENOL) tablet 650 mg  650 mg Oral Q6H PRN Audery Amel, MD      . alum & mag hydroxide-simeth (MAALOX/MYLANTA) 200-200-20 MG/5ML suspension 30 mL  30 mL Oral Q4H PRN Audery Amel, MD      . aspirin EC tablet 81 mg  81 mg Oral Daily Audery Amel, MD   [phone]  mg at 12/29/14 0858  . benztropine (COGENTIN) tablet 1 mg  1 mg Oral Daily Shari Prows, MD   1 mg at 12/29/14 0858  . carvedilol (COREG) tablet 3.125 mg  3.125 mg Oral BID WC Audery Amel, MD   3.125 mg at 12/29/14 0857  . cholecalciferol (VITAMIN D) tablet 2,000 Units  2,000 Units Oral Daily Audery Amel, MD   2,000 Units at 12/29/14 [phone]  . efavirenz-emtricitabine-tenofovir (ATRIPLA) 600-200-300 MG per tablet 1 tablet  1 tablet Oral QHS Audery Amel, MD   1 tablet at 12/28/14 2155  . fluPHENAZine (PROLIXIN) tablet 5 mg  5 mg Oral BID AC Audery Amel, MD   5 mg at 12/29/14 0858  .  levothyroxine (SYNTHROID, LEVOTHROID) tablet 25 mcg  25 mcg Oral QAC breakfast Audery Amel, MD   25 mcg at 12/29/14 0857  . lithium carbonate (LITHOBID) CR tablet 600 mg  600 mg Oral Q12H Audery Amel, MD   600 mg at 12/29/14 0858  . magnesium hydroxide (MILK OF MAGNESIA) suspension 30 mL  30 mL Oral Daily PRN Audery Amel, MD      . Melene Muller ON 01/01/2015] paliperidone (INVEGA SUSTENNA) injection 234 mg  234 mg Intramuscular Q28 days Jolanta B Pucilowska, MD      . paliperidone (INVEGA) 24 hr tablet 6 mg  6 mg Oral QHS Shari Prows, MD   6 mg at 12/28/14 2156  . simvastatin (ZOCOR) tablet 20 mg  20 mg Oral q1800 Audery Amel, MD   20 mg at 12/28/14 1732  . tamsulosin (FLOMAX) capsule 0.4 mg  0.4 mg Oral QPC supper Audery Amel, MD   0.4 mg at 12/28/14 1732  . terazosin (HYTRIN) capsule 1 mg  1 mg Oral QHS Audery Amel, MD   1 mg at 12/28/14 2155    Lab Results:  No results found for this or any previous visit (from the past 48 hour(s)).  Physical Findings: AIMS:  , ,  ,  ,    CIWA:    COWS:     Musculoskeletal: Strength & Muscle Tone: within normal limits Gait & Station: normal Patient leans: N/A  Psychiatric Specialty Exam: Review of Systems  Constitutional: Negative.   HENT: Negative.   Eyes: Negative.   Respiratory: Negative.   Cardiovascular: Negative.   Gastrointestinal: Negative.   Musculoskeletal: Negative.   Skin: Negative.   Neurological: Negative.   Psychiatric/Behavioral: Negative for depression, suicidal ideas, hallucinations, memory loss and substance abuse. The patient is nervous/anxious and has insomnia.   All other systems reviewed and are negative.   Blood pressure 124/79, pulse 78, temperature 98.2 F (36.8 C), temperature source Oral, resp. rate 20, height 6' (1.829 m), weight 79.379 kg (175 lb), SpO2 100 %.Body mass index is 23.73 kg/(m^2).  General Appearance: Fairly Groomed  Patent attorney::  Good  Speech:  Pressured and Slurred   Volume:  Normal  Mood:  Euphoric  Affect:  Mildly euphoric but not too unstable  Thought Process:  Goal Directed  Orientation:  Full (Time, Place, and Person)  Thought Content:  WDL  Suicidal Thoughts:  No  Homicidal Thoughts:  No  Memory:  Immediate;   Fair Recent;   Fair Remote;   Fair  Judgement:  Fair  Insight:  Fair  Psychomotor Activity:  Normal  Concentration:  Fair  Recall:  [employer] of Knowledge:Fair  Language: Fair  Akathisia:  No  Handed:  Right  AIMS (if indicated):     Assets:  Communication Skills Desire for Improvement Financial Resources/Insurance  ADL's:  Intact  Cognition: WNL  Sleep:  Number of Hours: 6.15   Treatment Plan Summary: Daily contact with patient to assess and evaluate symptoms and progress in treatment and Medication management   Mr. Hockey is a 54 year old male with long history of schizoaffective disorder admitted for agitated and threatening behavior at the group home in the context of medication noncompliance.  1. Agitation. This has resolved. He still is very talkative and at times slightly intrusive but he is showing better control of himself. Not threatening or hostile at all.  2. Mood and psychosis. We restarted lithium for mood stabilization and Prolixin for psychosis. The patient has been maintained on [place] injections. His next shot is due on 01/01/2015. We will also offer Prolixin Decanoate to improve compliance. Patient is tolerating and cooperating with medicine. Does not show any signs of acute side effects. 3. Hypothyroidism. We'll continue Synthroid.  4. HIV. We will continue Atripla.  5. Hypertension. We will continue antihypertensives. Blood pressure is under good control  6. Dyslipidemia. We'll continue Zocor.  7. BPH. We will continue Flomax.  8. Social. The patient is not allowed to return to his group home. Placement a must.  9. Disposition. To be established. Patient is requesting to be referred to  [facility]. I am dubious as to whether that is going to be a workable plan but encouraged him to continue to truck with his treatment team about it. He is attending groups appropriately.   John Clapacs 12/29/2014, 2:54 PM

## 2014-12-29 NOTE — Progress Notes (Signed)
D: Patient has c/o feeling hyper today but mood has remained calm and cooperative. He rates depression 9/10 and anxiety 8/10. Denies SI/HI/AVH. He does report seeing an illusion that the floor looks like steps to him. Reports good appetite and good concentration. A: On q 15 minute checks. Given meds. R: Cooperative. Neat and clean.

## 2014-12-29 NOTE — Plan of Care (Signed)
Problem: Ineffective individual coping Goal: LTG: Patient will report a decrease in negative feelings Outcome: Not Progressing Patient continues to rate depression and anxiety high.

## 2014-12-30 NOTE — Progress Notes (Signed)
[facility] MD Progress Note  12/30/2014 4:17 PM [name]  MRN:  [id number]  Subjective: Follow-up for this 54 year old man with schizoaffective disorder. He is complaining today because he is not being allowed to play basketball. He insists that he has not fallen in does not feel like he is a fall risk. I will continue to defer to the hospital policy. Patient states his mood is feeling a little better but he still feels anxious and worried about outpatient plan. He continues to be a little intrusive and hyperverbal but has not been threatening or truly obnoxious. No new physical complaints. Principal Problem: Schizoaffective disorder, bipolar type (HCC) Diagnosis:   Patient Active Problem List   Diagnosis Date Noted  . Tobacco use disorder [F17.200] 12/27/2014  . HIV positive (HCC) [Z21] 12/26/2014  . Hypertension [I10] 12/26/2014  . Hypothyroidism [E03.9] 12/26/2014  . Dyslipidemia [E78.5] 12/26/2014  . Schizoaffective disorder, bipolar type (HCC) [F25.0] 10/25/2014  . Alcohol abuse [F10.10] 10/25/2014   Total Time spent with patient: 25 minutes  Past Psychiatric History: Long history of depression and substance use.  Past Medical History:  Past Medical History  Diagnosis Date  . HIV (human immunodeficiency virus infection) (HCC)   . BPH (benign prostatic hypertrophy)   . Dyslipidemia   . Hypertension   . Hypothyroidism   . Schizoaffective disorder (HCC)    History reviewed. No pertinent past surgical history. Family History: History reviewed. No pertinent family history. Family Psychiatric  History: None reported. Social History:  History  Alcohol Use  . 1.2 - 1.8 oz/week  . 2-3 Cans of beer per week     History  Drug Use No    Social History   Social History  . Marital Status: Single    Spouse Name: N/A  . Number of Children: N/A  . Years of Education: N/A   Social History Main Topics  . Smoking status: Current Every Day Smoker -- 2.00 packs/day    Types:  Cigarettes  . Smokeless tobacco: None  . Alcohol Use: 1.2 - 1.8 oz/week    2-3 Cans of beer per week  . Drug Use: No  . Sexual Activity: Not Asked   Other Topics Concern  . None   Social History Narrative   Additional Social History:                         Sleep: Good  Appetite:  Poor  Current Medications: Current Facility-Administered Medications  Medication Dose Route Frequency Provider Last Rate Last Dose  . acetaminophen (TYLENOL) tablet 650 mg  650 mg Oral Q6H PRN Audery Amel, MD      . alum & mag hydroxide-simeth (MAALOX/MYLANTA) 200-200-20 MG/5ML suspension 30 mL  30 mL Oral Q4H PRN Audery Amel, MD      . aspirin EC tablet 81 mg  81 mg Oral Daily Audery Amel, MD   81 mg at 12/30/14 0800  . benztropine (COGENTIN) tablet 1 mg  1 mg Oral Daily Jolanta B Pucilowska, MD   1 mg at 12/30/14 0801  . carvedilol (COREG) tablet 3.125 mg  3.125 mg Oral BID WC Audery Amel, MD   3.125 mg at 12/30/14 1603  . cholecalciferol (VITAMIN D) tablet 2,000 Units  2,000 Units Oral Daily Audery Amel, MD   2,000 Units at 12/30/14 0759  . efavirenz-emtricitabine-tenofovir (ATRIPLA) 600-200-300 MG per tablet 1 tablet  1 tablet Oral QHS Audery Amel, MD   1 tablet  at 12/29/14 2159  . fluPHENAZine (PROLIXIN) tablet 5 mg  5 mg Oral BID AC Audery Amel, MD   5 mg at 12/30/14 1602  . levothyroxine (SYNTHROID, LEVOTHROID) tablet 25 mcg  25 mcg Oral QAC breakfast Audery Amel, MD   25 mcg at 12/30/14 [phone]  . lithium carbonate (LITHOBID) CR tablet 600 mg  600 mg Oral Q12H Audery Amel, MD   600 mg at 12/30/14 0800  . magnesium hydroxide (MILK OF MAGNESIA) suspension 30 mL  30 mL Oral Daily PRN Audery Amel, MD      . Melene Muller ON 01/01/2015] paliperidone (INVEGA SUSTENNA) injection 234 mg  234 mg Intramuscular Q28 days Jolanta B Pucilowska, MD      . paliperidone (INVEGA) 24 hr tablet 6 mg  6 mg Oral QHS Shari Prows, MD   6 mg at 12/29/14 2159  . simvastatin (ZOCOR)  tablet 20 mg  20 mg Oral q1800 Audery Amel, MD   20 mg at 12/29/14 1701  . tamsulosin (FLOMAX) capsule 0.4 mg  0.4 mg Oral QPC supper Audery Amel, MD   0.4 mg at 12/29/14 1701  . terazosin (HYTRIN) capsule 1 mg  1 mg Oral QHS Audery Amel, MD   1 mg at 12/29/14 2159    Lab Results:  No results found for this or any previous visit (from the past 48 hour(s)).  Physical Findings: AIMS:  , ,  ,  ,    CIWA:    COWS:     Musculoskeletal: Strength & Muscle Tone: within normal limits Gait & Station: normal Patient leans: N/A  Psychiatric Specialty Exam: Review of Systems  Constitutional: Negative.   HENT: Negative.   Eyes: Negative.   Respiratory: Negative.   Cardiovascular: Negative.   Gastrointestinal: Negative.   Musculoskeletal: Negative.   Skin: Negative.   Neurological: Negative.   Psychiatric/Behavioral: Negative for depression, suicidal ideas, hallucinations, memory loss and substance abuse. The patient is nervous/anxious and has insomnia.   All other systems reviewed and are negative.   Blood pressure 131/80, pulse 79, temperature 97.7 F (36.5 C), temperature source Oral, resp. rate 20, height 6' (1.829 m), weight 79.379 kg (175 lb), SpO2 100 %.Body mass index is 23.73 kg/(m^2).  General Appearance: Fairly Groomed  Patent attorney::  Good  Speech:  Normal Rate  Volume:  Normal  Mood:  Euphoric  Affect:  Mildly euphoric but not too unstable  Thought Process:  Goal Directed  Orientation:  Full (Time, Place, and Person)  Thought Content:  WDL  Suicidal Thoughts:  No  Homicidal Thoughts:  No  Memory:  Immediate;   Fair Recent;   Fair Remote;   Fair  Judgement:  Fair  Insight:  Fair  Psychomotor Activity:  Normal  Concentration:  Fair  Recall:  [employer] of Knowledge:Fair  Language: Fair  Akathisia:  No  Handed:  Right  AIMS (if indicated):     Assets:  Communication Skills Desire for Improvement Financial Resources/Insurance  ADL's:  Intact  Cognition:  WNL  Sleep:  Number of Hours: 5   Treatment Plan Summary: Daily contact with patient to assess and evaluate symptoms and progress in treatment and Medication management   [name] is a 55 year old male with long history of schizoaffective disorder admitted for agitated and threatening behavior at the group home in the context of medication noncompliance.  1. Agitation. This has resolved. He still is very talkative and at times slightly intrusive but he  is showing better control of himself. Not threatening or hostile at all.  2. Mood and psychosis. We restarted lithium for mood stabilization and Prolixin for psychosis. The patient has been maintained on [place] injections. His next shot is due on 01/01/2015. We will also offer Prolixin Decanoate to improve compliance. Patient is tolerating and cooperating with medicine. Does not show any signs of acute side effects. 3. Hypothyroidism. We'll continue Synthroid.  4. HIV. We will continue Atripla.  5. Hypertension. We will continue antihypertensives. Blood pressure is under good control  6. Dyslipidemia. We'll continue Zocor.  7. BPH. We will continue Flomax.  8. Social. The patient is not allowed to return to his group home. Placement a must.  9. Disposition. To be established. Patient is continuing to think about this a lot. He is starting to be able to talk about how he would need to change his behavior to live more independently. He tells me that the act team was in contact with him since yesterday and wants to talk with the treatment team here. Supportive counseling.  Reviewed nursing notes, reviewed vital signs, reviewed social work note, reviewed lab studies. No indication to change current medicine. Julissa Browning 12/30/2014, 4:17 PM

## 2014-12-30 NOTE — [facility] Group Notes (Signed)
[facility] Group Notes:  (Nursing/MHT/Case Management/Adjunct)  Date:  12/30/2014  Time:  1:57 AM  Type of Therapy:  Group Therapy  Participation Level:  Active  Participation Quality:  Appropriate, Monopolizing and Redirectable  Affect:  Appropriate  Cognitive:  Appropriate  Insight:  Appropriate  Engagement in Group:  Engaged and Monopolizing  Modes of Intervention:  Discussion  Summary of Progress/Problems:PT had been giving notes to fellow PT prior to the start of group. Being that this was addressed by his nurse. He tried to talk about this, and how it was unfair that the fellow PT had told on him. Staff had to redirect PT numerous times and finally, made aware that if he wanted to provide that PT with a apology it would have to be outside of group.   Fanny Skates Aunya Lemler 12/30/2014, 1:57 AM

## 2014-12-30 NOTE — Progress Notes (Signed)
Patient ID: [name], male   DOB: 1961/02/02, 54 y.o.   MRN: [id number]  CSW attempted to complete assessment. Pt was unable to participate due to current presentation.   Daisy Floro Okema Rollinson MSW, LCSWA  12/30/2014 11:29 AM

## 2014-12-30 NOTE — [facility] Group Notes (Signed)
[facility] LCSW Group Therapy  12/30/2014 3:55 PM  Type of Therapy:  Group Therapy  Participation Level:  Minimal  Participation Quality:  Inattentive  Affect:  Flat  Cognitive:  Disorganized  Insight:  Limited  Engagement in Therapy:  Limited  Modes of Intervention:  Discussion, Education, Socialization and Support  Summary of Progress/Problems:Todays topic: Grudges  Patients will be encouraged to discuss their thoughts, feelings, and behaviors as to why one holds on to grudges and reasons why people have grudges. Patients will process the impact of grudges on their daily lives and identify thoughts and feelings related to holding grudges. Patients will identify feelings and thoughts related to what life would look like without grudges. [name] discussed his group home situation. He had difficulties staying on topic.   [name] L Raphel Stickles MSW, LCSWA  12/30/2014, 3:55 PM

## 2014-12-30 NOTE — Progress Notes (Signed)
D: Pt is awake and active in the milieu this evening. Pt mood is irritable and his affect is flat. Pt denies SI/HI and AVH at this time. Pt is somewhat intrusive but is also cooperative with staff. Pt has been sending a male pt love letters which became a nuisance to her.   A: Writer provided emotional support and administered medications as prescribed. Passenger transport manager discussed his behavior and presented the letters to him.   R: Pt is receptive and understands that his behavior is unacceptable. Pt agreed to maintain healthy boundaries. H also apologized during group and then wrote additional letters for staff, all of which are in the pt chart. Pt has been pleasant and appropriate on the unit since our discussion. Pt went to bed after dayroom was closed.

## 2014-12-30 NOTE — Progress Notes (Signed)
D: Patient has been intrusive today. He is asking for shoes out of his locker so he can play basketball and asking staff to call his ACT team to bring his belongings here. Denies SI/HI or AVH. States he would like to be discharged tomorrow. A: On q 15 minute checks. Given meds. Limits set in terms of patient frequent requests. R: Cooperative; redirectable.

## 2014-12-30 NOTE — Plan of Care (Signed)
Problem: Alteration in mood Goal: STG-Patient is able to discuss feelings and issues (Patient is able to discuss feelings and issues leading to depression)  Outcome: Progressing Pt acknowledges inapropriate behavior and expresses understanding.

## 2014-12-30 NOTE — Plan of Care (Signed)
Problem: Ineffective individual coping Goal: LTG-Other (Specify)- Outcome: Progressing Pt playing cards with staff and peers and interacting appropriately

## 2014-12-31 MED ORDER — FLUPHENAZINE DECANOATE 25 MG/ML IJ SOLN
50.0000 mg | INTRAMUSCULAR | Status: DC
  Administered 2015-01-01 – 2015-01-29 (×3): 50 mg via INTRAMUSCULAR
  Filled 2014-12-31 (×4): qty 2

## 2014-12-31 MED ORDER — TUBERCULIN PPD 5 UNIT/0.1ML ID SOLN
5.0000 [IU] | Freq: Once | INTRADERMAL | Status: AC
Start: 2014-12-31 — End: 2015-01-02
  Administered 2014-12-31: 5 [IU] via INTRADERMAL
  Filled 2014-12-31 (×2): qty 0.1

## 2014-12-31 NOTE — Progress Notes (Signed)
[facility] MD Progress Note  12/31/2014 12:46 PM [name]  MRN:  [id number]  Subjective:  [name] feels better today but is still intrusive, disorganized and difficult to redirect. He continues to write copious notes. Today he is preoccupied with his discharge. He began agrees to go to a group home under pressure from his ACT team. He complains not being able to play basketball as his shoelaces were removed from his snickers. He insists that some documents that are still at the group home be delivered to the hospital. He filed Architect. He has no somatic problems. He tolerates medications well. He tries to participate in groups but has been rather disruptive.  Principal Problem: Schizoaffective disorder, bipolar type (HCC) Diagnosis:   Patient Active Problem List   Diagnosis Date Noted  . Tobacco use disorder [F17.200] 12/27/2014  . HIV positive (HCC) [Z21] 12/26/2014  . Hypertension [I10] 12/26/2014  . Hypothyroidism [E03.9] 12/26/2014  . Dyslipidemia [E78.5] 12/26/2014  . Schizoaffective disorder, bipolar type (HCC) [F25.0] 10/25/2014  . Alcohol abuse [F10.10] 10/25/2014   Total Time spent with patient: 20 minutes  Past Psychiatric History: chronic schizophrenia.  Past Medical History:  Past Medical History  Diagnosis Date  . HIV (human immunodeficiency virus infection) (HCC)   . BPH (benign prostatic hypertrophy)   . Dyslipidemia   . Hypertension   . Hypothyroidism   . Schizoaffective disorder (HCC)    History reviewed. No pertinent past surgical history. Family History: History reviewed. No pertinent family history. Family Psychiatric  History:  Social History:  History  Alcohol Use  . 1.2 - 1.8 oz/week  . 2-3 Cans of beer per week     History  Drug Use No    Social History   Social History  . Marital Status: Single    Spouse Name: N/A  . Number of Children: N/A  . Years of Education: N/A   Social History Main Topics  . Smoking status:  Current Every Day Smoker -- 2.00 packs/day    Types: Cigarettes  . Smokeless tobacco: None  . Alcohol Use: 1.2 - 1.8 oz/week    2-3 Cans of beer per week  . Drug Use: No  . Sexual Activity: Not Asked   Other Topics Concern  . None   Social History Narrative   Additional Social History:                         Sleep: Good  Appetite:  Good  Current Medications: Current Facility-Administered Medications  Medication Dose Route Frequency Provider Last Rate Last Dose  . acetaminophen (TYLENOL) tablet 650 mg  650 mg Oral Q6H PRN Audery Amel, MD      . alum & mag hydroxide-simeth (MAALOX/MYLANTA) 200-200-20 MG/5ML suspension 30 mL  30 mL Oral Q4H PRN Audery Amel, MD      . aspirin EC tablet 81 mg  81 mg Oral Daily Audery Amel, MD   81 mg at 12/31/14 4098  . benztropine (COGENTIN) tablet 1 mg  1 mg Oral Daily Shari Prows, MD   1 mg at 12/31/14 0833  . carvedilol (COREG) tablet 3.125 mg  3.125 mg Oral BID WC Audery Amel, MD   3.125 mg at 12/31/14 1191  . cholecalciferol (VITAMIN D) tablet 2,000 Units  2,000 Units Oral Daily Audery Amel, MD   2,000 Units at 12/31/14 [phone]  . efavirenz-emtricitabine-tenofovir (ATRIPLA) 600-200-300 MG per tablet 1 tablet  1 tablet Oral  QHS Audery Amel, MD   1 tablet at 12/30/14 2107  . fluPHENAZine (PROLIXIN) tablet 5 mg  5 mg Oral BID AC Audery Amel, MD   5 mg at 12/31/14 4098  . levothyroxine (SYNTHROID, LEVOTHROID) tablet 25 mcg  25 mcg Oral QAC breakfast Audery Amel, MD   25 mcg at 12/31/14 [phone]  . lithium carbonate (LITHOBID) CR tablet 600 mg  600 mg Oral Q12H Audery Amel, MD   600 mg at 12/31/14 4782  . magnesium hydroxide (MILK OF MAGNESIA) suspension 30 mL  30 mL Oral Daily PRN Audery Amel, MD      . Melene Muller ON 01/01/2015] paliperidone (INVEGA SUSTENNA) injection 234 mg  234 mg Intramuscular Q28 days Jolanta B Pucilowska, MD      . paliperidone (INVEGA) 24 hr tablet 6 mg  6 mg Oral QHS Jolanta B Pucilowska,  MD   6 mg at 12/30/14 2106  . simvastatin (ZOCOR) tablet 20 mg  20 mg Oral q1800 Audery Amel, MD   20 mg at 12/30/14 1726  . tamsulosin (FLOMAX) capsule 0.4 mg  0.4 mg Oral QPC supper Audery Amel, MD   0.4 mg at 12/30/14 1726  . terazosin (HYTRIN) capsule 1 mg  1 mg Oral QHS Audery Amel, MD   1 mg at 12/30/14 2106    Lab Results: No results found for this or any previous visit (from the past 48 hour(s)).  Physical Findings: AIMS:  , ,  ,  ,    CIWA:    COWS:     Musculoskeletal: Strength & Muscle Tone: within normal limits Gait & Station: normal Patient leans: N/A  Psychiatric Specialty Exam: Review of Systems  All other systems reviewed and are negative.   Blood pressure 125/83, pulse 83, temperature 97.7 F (36.5 C), temperature source Oral, resp. rate 20, height 6' (1.829 m), weight 79.379 kg (175 lb), SpO2 100 %.Body mass index is 23.73 kg/(m^2).  General Appearance: Casual  Eye Contact::  Good  Speech:  Pressured  Volume:  Increased  Mood:  Euphoric  Affect:  Labile  Thought Process:  Disorganized  Orientation:  Full (Time, Place, and Person)  Thought Content:  Delusions and Paranoid Ideation  Suicidal Thoughts:  No  Homicidal Thoughts:  No  Memory:  Immediate;   Fair Recent;   Fair Remote;   Fair  Judgement:  Fair  Insight:  Fair  Psychomotor Activity:  Increased  Concentration:  Fair  Recall:  [employer] of Knowledge:Fair  Language: Fair  Akathisia:  No  Handed:  Right  AIMS (if indicated):     Assets:  Communication Skills Desire for Improvement Financial Resources/Insurance Resilience  ADL's:  Intact  Cognition: WNL  Sleep:  Number of Hours: 5.75   Treatment Plan Summary: Daily contact with patient to assess and evaluate symptoms and progress in treatment and Medication management   [name] is a 54 year old male with long history of schizoaffective disorder admitted for agitated and threatening behavior at the group home in the context  of medication noncompliance.  1. Agitation. This has resolved.  2. Mood and psychosis. We restarted lithium for mood stabilization and Prolixin for psychosis. The patient has been maintained on [place] injections. His next shot is due on 01/01/2015. We will also offer Prolixin Decanoate to improve compliance.  3. Hypothyroidism. We'll continue Synthroid.  4. HIV. We will continue Atripla.  5. Hypertension. We will continue antihypertensives.  6. Dyslipidemia. We'll continue  Zocor.  7. BPH. We will continue Flomax.  8. Social. The patient is not allowed to return to his group home. Placement a must.  9. Disposition. To be established.  Jolanta Pucilowska 12/31/2014, 12:46 PM

## 2014-12-31 NOTE — Progress Notes (Addendum)
D: Pt is awake and active in the milieu this evening. Pt mood is depressed and his affect is sad. Pt denies SI/HI and AVH at this time. Pt is playing cards with staff and peers. There have been no further issues with male patients on the unit.   A: Writer provided emotional support and administered medications as prescribed.  R: Pt is interacting appropriately with staff and peers. Pt stayed up watching TV in the dayroom. Pt did have an episode of urinary incontinence during the night. Pt requested linens and remade his own bed.

## 2014-12-31 NOTE — Progress Notes (Signed)
He was intrusive with peers & staff this morning.Denies depression,suicidal ideation and hallucination.Compliant with medications.Attended groups.He went outside & played basket ball today.Stated that he enjoyed that.

## 2014-12-31 NOTE — [facility] Group Notes (Signed)
[facility] Group Notes:  (Nursing/MHT/Case Management/Adjunct)  Date:  12/31/2014  Time:  1:59 PM  Type of Therapy:  Psychoeducational Skills  Participation Level:  None left early    [name] 12/31/2014, 1:59 PM

## 2014-12-31 NOTE — Progress Notes (Deleted)
Recreation Therapy Notes  Date: 10.10.16 Time: 3:00 pm Location: Craft Room  Group Topic: Self-expression  Goal Area(s) Addresses:  Patient will identify one color per emotion listed on wheel.  Patient will verbalize benefit of using art as a means of self-expression. Patient will verbalize one emotion experienced during session. Patient will be educated on other forms of self-expression.  Behavioral Response: Attentive, Interactive  Intervention: Emotion Wheel  Activity: Patients were given a worksheet with 7 different emotions. Patients were instructed to pick a color for each emotion.   Education: LRT educated patients on different forms of self-expression.  Education Outcome: Acknowledges education/In group clarification offered/Needs additional education.   Clinical Observations/Feedback: Patient picked colors for each emotion. Patient contributed to group discussion by stating what colors he picked for certain emotions. Patient got sidetracked. LRT redirected patient. Patient compliant.  Jacquelynn Cree, LRT/CTRS 12/31/2014 4:26 PM

## 2014-12-31 NOTE — Progress Notes (Signed)
Recreation Therapy Notes  Date: 10.10.16 Time: 3:00 pm Location: Craft Room  Group Topic: Self-expression  Goal Area(s) Addresses:  Patient will identify one color per emotion listed on wheel.  Patient will verbalize benefit of using art as a means of self-expression. Patient will verbalize one emotion experienced during session. Patient will be educated on other forms of self-expression.  Behavioral Response: Attentive, Interactive  Intervention: Emotion Wheel  Activity: Patients were given a worksheet with 7 different emotions. Patients were instructed to pick a color for each emotion.   Education:LRT educated patients on different forms of self-expression.  Education Outcome: In group clarification offered   Clinical Observations/Feedback: Patient picked colors for each emotion. Patient contributed to group discussion by stating what colors he picked for certain emotions. Patient got sidetracked. LRT redirected patient. Patient compliant.  Jacquelynn Cree, LRT/CTRS 12/31/2014 4:40 PM

## 2015-01-01 MED ORDER — DIVALPROEX SODIUM 500 MG PO DR TAB
500.0000 mg | DELAYED_RELEASE_TABLET | Freq: Every day | ORAL | Status: DC
  Administered 2015-01-01 – 2015-01-13 (×10): 500 mg via ORAL
  Filled 2015-01-01 (×10): qty 1

## 2015-01-01 NOTE — Progress Notes (Signed)
D: Pt is awake and active in the milieu this evening. Pt mood is euthymic and his affect is flat. Pt denies SI/HI and AVH at this time. Pt is getting along well with staff and peers, although he is intrusive at times. Pt is redirectable.  A: Writer provided emotional support and administered medications as prescribed.  R: Pt behavior is appropriate on the unit, and he is pleasant and cooperative with staff. He stayed up late until dayroom was closed for the evening.

## 2015-01-01 NOTE — Plan of Care (Signed)
Problem: Alteration in mood Goal: LTG-Patient reports reduction in suicidal thoughts (Patient reports reduction in suicidal thoughts and is able to verbalize a safety plan for whenever patient is feeling suicidal)  Outcome: Progressing Patient denies any suicidal thoughts at this time.  Goal: LTG-Pt's behavior demonstrates decreased signs of depression (Patient's behavior demonstrates decreased signs of depression to the point the patient is safe to return home and continue treatment in an outpatient setting)  Outcome: Progressing Patient out in the milieu socializing with peers.  Goal: STG-Patient is able to discuss feelings and issues (Patient is able to discuss feelings and issues leading to depression)  Outcome: Progressing Patient readily discusses his feelings and issues with staff. Goal: STG-Patient reports thoughts of self-harm to staff Outcome: Progressing Patient denies any thoughts of self-harm at this time.
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
## 2015-01-01 NOTE — [facility] Group Notes (Signed)
[facility] LCSW Group Therapy  01/01/2015 2:52 PM  Type of Therapy:  Group Therapy  Participation Level:  Active  Participation Quality:  Intrusive and Redirectable  Affect:  Labile  Cognitive:  Alert and Disorganized  Insight:  Limited  Engagement in Therapy:  Improving  Modes of Intervention:  Socialization and Support  Summary of Progress/Problems:Patient attended and participated in group discussion. Patient was supportive of other group members who shared their struggle in relationships with family and friends. Patient was intrusive but redirectable  Lulu Riding, MSW, LCSWA 01/01/2015, 2:52 PM

## 2015-01-01 NOTE — Progress Notes (Signed)
Patient is alert oriented during the shift. Patient continues to voice concerns about his belongings and requesting staff to retrieve his belongings. Patient has written multiple notes to the MD, social worker and nursing staff requesting his belongings to be brought to the hospital, requesting to leave, requesting to smoke his cigars and cigarettes. Patient can be demanding at times but responds to redirection.

## 2015-01-01 NOTE — Progress Notes (Signed)
Recreation Therapy Notes  Date: 10.11.16 Time: 3:00 pm Location: Craft Room  Group Topic: Goal Setting  Goal Area(s) Addresses:  Patient will write at least one goal. Patient will write at least one obstacle.  Behavioral Response: Attentive, Disruptive, Left early  Intervention: Recovery Goal Chart  Activity: Patients were instructed to make a goal chart including goals, obstacles, the date they started working on their goal, and the date they achieved their goal.  Education: LRT educated patients on healthy way to celebrate reaching their goals.  Education Outcome: Patient left before LRT educated group.  Clinical Observations/Feedback: Patient completed activity by writing 3 goals and 3 obstacles. Patient left group at 3:13 pm to use the bathroom. Patient returned to group at 3:15 pm. Patient left group again at 3:16 pm to use the bathroom. Patient returned to group at approximately 3:20 pm. Patient started talking about writing another goal. LRT informed patient that was fine. Patient left group again at approximately 3:25 pm stating "I won't be back." Patient did not contribute to group discussion.  Jacquelynn Cree, LRT/CTRS 01/01/2015 4:37 PM

## 2015-01-01 NOTE — [facility] Group Notes (Signed)
[facility] Group Notes:  (Nursing/MHT/Case Management/Adjunct)  Date:  01/01/2015  Time:  1:45 PM  Type of Therapy:  Psychoeducational Skills  Participation Level:  Active  Participation Quality:  Appropriate  Affect:  Appropriate  Cognitive:  Appropriate  Insight:  Appropriate  Engagement in Group:  Engaged  Modes of Intervention:  Discussion, Education and Support  Summary of Progress/Problems:  Darrow Bussing 01/01/2015, 1:45 PM
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
## 2015-01-01 NOTE — Plan of Care (Signed)
Problem: Alteration in mood Goal: LTG-Pt's behavior demonstrates decreased signs of depression (Patient's behavior demonstrates decreased signs of depression to the point the patient is safe to return home and continue treatment in an outpatient setting)  Outcome: Progressing Pt mood has improved and he is interacting more with staff and peers.

## 2015-01-02 LAB — LITHIUM LEVEL: Lithium Lvl: 0.5 mmol/L — ABNORMAL LOW (ref 0.60–1.20)

## 2015-01-02 MED ORDER — LITHIUM CARBONATE ER 300 MG PO TBCR
600.0000 mg | EXTENDED_RELEASE_TABLET | Freq: Every day | ORAL | Status: DC
  Administered 2015-01-03 – 2015-01-10 (×7): 600 mg via ORAL
  Filled 2015-01-02 (×7): qty 2

## 2015-01-02 MED ORDER — LITHIUM CARBONATE 300 MG PO CAPS: 900.0000 mg | ORAL_CAPSULE | Freq: Every day | ORAL | Status: DC

## 2015-01-02 MED ORDER — LITHIUM CARBONATE ER 450 MG PO TBCR
900.0000 mg | EXTENDED_RELEASE_TABLET | Freq: Every day | ORAL | Status: DC
  Administered 2015-01-02 – 2015-01-10 (×7): 900 mg via ORAL
  Filled 2015-01-02 (×8): qty 2

## 2015-01-02 MED ORDER — BENZTROPINE MESYLATE 1 MG PO TABS
2.0000 mg | ORAL_TABLET | Freq: Every day | ORAL | Status: DC
  Administered 2015-01-03 – 2015-01-10 (×8): 2 mg via ORAL
  Filled 2015-01-02 (×8): qty 2

## 2015-01-02 NOTE — B[facility] Group Notes (Signed)
[facility] LCSW Group Therapy  01/02/2015 2:56 PM  Type of Therapy:  Group Therapy  Participation Level:  Minimal  Participation Quality:  Inattentive  Affect:  Labile  Cognitive:  Disorganized  Insight:  Limited  Engagement in Therapy:  Distracting  Modes of Intervention:  Discussion, Education, Socialization and Support  Summary of Progress/Problems:Emotional Regulation: Patients will identify both negative and positive emotions. They will discuss emotions they have difficulty regulating and how they impact their lives. Patients will be asked to identify healthy coping skills to combat unhealthy reactions to negative emotions.   Daniel MuirJamie attended group but had difficulties staying on topic. He asked when the sex education group was going to be held and for a sheet of paper so he could write a note. At the end of group he stated "I have HIV and I am Bisexual."   Sempra EnergyCandace L Margene Cherian MSW, LCSWA  01/02/2015, 2:56 PM

## 2015-01-02 NOTE — Progress Notes (Signed)
Recreation Therapy Notes  Date: 10.12.16 Time: 3:00 pm Location: Craft Room  Group Topic: Self-Esteem  Goal Area(s) Addresses:  Patient will write at least one positive trait about self. Patient will verbalize benefit of having a healthy self-esteem.  Behavioral Response: Attentive, Left early  Intervention: I Am  Activity: Patients were given a worksheet with the letter I on it and instructed to fill it with positive traits about themselves.  Education: LRT educated patients on ways they can increase their self-esteem.  Education Outcome: Patient left before LRT educated patients.   Clinical Observations/Feedback: Patient completed activity by writing positive traits about himself. Patient left group at 3:29 pm. Patient did not return to group.  Jacquelynn CreeGreene,Darey Hershberger M, LRT/CTRS 01/02/2015 4:17 PM

## 2015-01-02 NOTE — B[facility] Group Notes (Signed)
[facility] Group Notes:  (Nursing/MHT/Case Management/Adjunct)  Date:  01/02/2015  Time:  3:52 AM  Type of Therapy:  Group Therapy  Participation Level:  Minimal  Participation Quality:  Intrusive  Affect:  Defensive and Irritable  Cognitive:  Alert and Disorganized  Insight:  Limited  Engagement in Group:  Distracting and Monopolizing  Modes of Intervention:  Limit-setting  Summary of Progress/Problems: Pt was very intrusive as fellow PT attempted to comment on their disappointment with their Child psychotherapistsocial worker. Pt stated that "all these social workers are prejudice..." as his insults continued staff asked PT to please leave group. PT stated that he told us that he didn't want to be here anyway and left the group.   Fanny Skatesshley Imani Kamdyn Colborn 01/02/2015, 3:52 AM

## 2015-01-02 NOTE — Progress Notes (Signed)
Patient alert oriented x3, calm and cooperative during the shift. Patient attended groups and was medication compliant. Patient denies any SI/HI/AVH during the shift.

## 2015-01-02 NOTE — Progress Notes (Signed)
[facility]  01/02/2015 12:51 PM Daniel MoundJaime Pruett  MRN:  [id number]  Subjective:  [name] is laud, intrusive, argumentative. There is a conflict with a peer on the unit. He still writes copious letters and demands his belongings be brought to the hospital from the group home. He accepts medications but his lithium level today is subtherapeutic. Does not participate in groups and if he does he is disruptive and hard to redirect.  Principal Problem: Schizoaffective disorder, bipolar type (HCC) Diagnosis:   Patient Active Problem List   Diagnosis Date Noted  . Tobacco use disorder [F17.200] 12/27/2014  . HIV positive (HCC) [Z21] 12/26/2014  . Hypertension [I10] 12/26/2014  . Hypothyroidism [E03.9] 12/26/2014  . Dyslipidemia [E78.5] 12/26/2014  . Schizoaffective disorder, bipolar type (HCC) [F25.0] 10/25/2014  . Alcohol abuse [F10.10] 10/25/2014   Total Time spent with patient: 20 minutes  Past Psychiatric History: Long history of schizophrenia.  Past Medical History:  Past Medical History  Diagnosis Date  . HIV (human immunodeficiency virus infection) (HCC)   . BPH (benign prostatic hypertrophy)   . Dyslipidemia   . Hypertension   . Hypothyroidism   . Schizoaffective disorder (HCC)    History reviewed. No pertinent past surgical history. Family History: History reviewed. No pertinent family history. Family Psychiatric  History: None reported. Social History:  History  Alcohol Use  . 1.2 - 1.8 oz/week  . 2-3 Cans of beer per week     History  Drug Use No    Social History   Social History  . Marital Status: Single    Spouse Name: N/A  . Number of Children: N/A  . Years of Education: N/A   Social History Main Topics  . Smoking status: Current Every Day Smoker -- 2.00 packs/day    Types: Cigarettes  . Smokeless tobacco: None  . Alcohol Use: 1.2 - 1.8 oz/week    2-3 Cans of beer per week  . Drug Use: No  . Sexual Activity: Not Asked   Other Topics Concern   . None   Social History Narrative   Additional Social History:                         Sleep: Fair  Appetite:  Good  Current Medications: Current Facility-Administered Medications  Medication Dose Route Frequency Provider Last Rate Last Dose  . acetaminophen (TYLENOL) tablet 650 mg  650 mg Oral Q6H PRN Audery AmelJohn T Clapacs, MD      . alum & mag hydroxide-simeth (MAALOX/MYLANTA) 200-200-20 MG/5ML suspension 30 mL  30 mL Oral Q4H PRN Audery AmelJohn T Clapacs, MD      . aspirin EC tablet 81 mg  81 mg Oral Daily Audery AmelJohn T Clapacs, MD   81 mg at 01/02/15 1103  . benztropine (COGENTIN) tablet 1 mg  1 mg Oral Daily Karisa Nesser B Martesha Niedermeier, MD   1 mg at 01/02/15 1105  . carvedilol (COREG) tablet 3.125 mg  3.125 mg Oral BID WC Audery AmelJohn T Clapacs, MD   3.125 mg at 01/02/15 0841  . cholecalciferol (VITAMIN D) tablet 2,000 Units  2,000 Units Oral Daily Audery AmelJohn T Clapacs, MD   2,000 Units at 01/02/15 1104  . divalproex (DEPAKOTE) DR tablet 500 mg  500 mg Oral QHS Shari ProwsJolanta B Stephania Macfarlane, MD   500 mg at 01/01/15 2143  . efavirenz-emtricitabine-tenofovir (ATRIPLA) 600-200-300 MG per tablet 1 tablet  1 tablet Oral QHS Audery AmelJohn T Clapacs, MD   1 tablet at 01/01/15 2143  .  fluPHENAZine (PROLIXIN) tablet 5 mg  5 mg Oral BID AC Audery Amel, MD   5 mg at 01/02/15 0840  . fluPHENAZine decanoate (PROLIXIN) injection 50 mg  50 mg Intramuscular Q14 Days Shari Prows, MD   50 mg at 01/01/15 0811  . levothyroxine (SYNTHROID, LEVOTHROID) tablet 25 mcg  25 mcg Oral QAC breakfast Audery Amel, MD   50 mcg at 01/02/15 0645  . lithium carbonate (LITHOBID) CR tablet 600 mg  600 mg Oral Q12H Audery Amel, MD   600 mg at 01/02/15 1104  . magnesium hydroxide (MILK OF MAGNESIA) suspension 30 mL  30 mL Oral Daily PRN Audery Amel, MD      . paliperidone (INVEGA SUSTENNA) injection 234 mg  234 mg Intramuscular Q28 days Shari Prows, MD   234 mg at 01/01/15 0943  . paliperidone (INVEGA) 24 hr tablet 6 mg  6 mg Oral QHS Shari Prows, MD   6 mg at 01/01/15 2141  . simvastatin (ZOCOR) tablet 20 mg  20 mg Oral q1800 Audery Amel, MD   20 mg at 01/01/15 1826  . tamsulosin (FLOMAX) capsule 0.4 mg  0.4 mg Oral QPC supper Audery Amel, MD   0.4 mg at 01/01/15 1827  . terazosin (HYTRIN) capsule 1 mg  1 mg Oral QHS Audery Amel, MD   1 mg at 01/01/15 2141  . tuberculin injection 5 Units  5 Units Intradermal Once Shari Prows, MD   5 Units at 12/31/14 1755    Lab Results:  Results for orders placed or performed during the hospital encounter of 12/26/14 (from the past 48 hour(s))  Lithium level     Status: Abnormal   Collection Time: 01/02/15  7:07 AM  Result Value Ref Range   Lithium Lvl 0.50 (L) 0.60 - 1.20 mmol/L    Physical Findings: AIMS:  , ,  ,  ,    CIWA:    COWS:     Musculoskeletal: Strength & Muscle Tone: within normal limits Gait & Station: normal Patient leans: N/A  Psychiatric Specialty Exam: Review of Systems  All other systems reviewed and are negative.   Blood pressure 128/88, pulse 83, temperature 98.3 F (36.8 C), temperature source Oral, resp. rate 18, height 6' (1.829 m), weight 79.379 kg (175 lb), SpO2 100 %.Body mass index is 23.73 kg/(m^2).  General Appearance: Casual  Eye Contact::  Good  Speech:  Pressured  Volume:  Increased  Mood:  Dysphoric and Irritable  Affect:  Labile  Thought Process:  Goal Directed  Orientation:  Full (Time, Place, and Person)  Thought Content:  Delusions and Paranoid Ideation  Suicidal Thoughts:  No  Homicidal Thoughts:  No  Memory:  Immediate;   Fair Recent;   Fair Remote;   Fair  Judgement:  Impaired  Insight:  Shallow  Psychomotor Activity:  Increased  Concentration:  Fair  Recall:  [employer] of Knowledge:Fair  Language: Fair  Akathisia:  No  Handed:  Right  AIMS (if indicated):     Assets:  Communication Skills Desire for Improvement Financial Resources/Insurance Physical Health Resilience  ADL's:  Intact   Cognition: WNL  Sleep:  Number of Hours: 5.5   Treatment Plan Summary: Daily contact with patient to assess and evaluate symptoms and progress in treatment and Medication management   [name] is a 54 year old male with long history of schizoaffective disorder admitted for agitated and threatening behavior at the group home in  the context of medication noncompliance.  1. Agitation. This has resolved.  2. Mood and psychosis. We restarted lithium for mood stabilization and Prolixin for psychosis. He was given injections of [place] and Prolixin Decanoate to improve compliance. Lithium level is 0.5. Will increase Lithium to 1200 mg and add low dose depakote. He has a history of high ammonia on Depakote.  3. Hypothyroidism. We'll continue Synthroid. TSH is normal.  4. HIV. We will continue Atripla.  5. Hypertension. We will continue antihypertensives.  6. Dyslipidemia. We'll continue Zocor. Lipid panel is not elevated.  7. BPH. We will continue Flomax.  8. Social. The patient is not allowed to return to his group home. Placement a must.  9. Disposition. To be established.  [name] 01/02/2015, 12:51 PM

## 2015-01-02 NOTE — [facility] Group Notes (Signed)
[facility]   01/02/2015 10:26 AM  Participation Quality:   Patient did not attend group   Lulu RidingIngle, Navdeep Halt T, MSW, [employer] IncLCSWA

## 2015-01-02 NOTE — Plan of Care (Signed)
Problem: Alteration in mood Goal: LTG-Patient reports reduction in suicidal thoughts (Patient reports reduction in suicidal thoughts and is able to verbalize a safety plan for whenever patient is feeling suicidal)  Outcome: Progressing Patient reports that he is not feeling suicidal

## 2015-01-02 NOTE — Plan of Care (Signed)
Problem: [facility] Patient Education Outcome: Not Progressing Patient needs a lot of redirection and coaching.

## 2015-01-02 NOTE — Progress Notes (Signed)
Patient denies SI. Patient can be pleasant but irritable at times. Dismissive when trying to assess. Patient med compliant. Patient provider with diaper for sleep. This writer woke patient up during the night for toileting. Patient soaked through diaper, bed changed. Safety maintained during shift. Will continue to monitor.

## 2015-01-02 NOTE — [facility] Group Notes (Signed)
[facility] Group Notes:  (Nursing/MHT/Case Management/Adjunct)  Date:  01/02/2015  Time:  10:41 PM  Type of Therapy:  Group Therapy  Participation Level:  Active  Participation Quality:  Appropriate  Affect:  Appropriate  Cognitive:  Appropriate  Insight:  Appropriate  Engagement in Group:  Engaged  Modes of Intervention:  Discussion  Summary of Progress/Problems:  Daniel EkJanice Marie Francella Benitez 01/02/2015, 10:41 PM

## 2015-01-03 NOTE — Progress Notes (Signed)
D.Patient denies suicidal ideations. Patient has been calmer today and less irritable. Patient is compliant with medications and socializes with peers.  A. Patient instructed on medications and verbalized understanding. Q 15 min checks maintained during shift for safety.Will continue to monitor. R. Patient voiced no complaints.

## 2015-01-03 NOTE — Progress Notes (Signed)
[facility]  01/03/2015 1:20 PM Lavona MoundJaime Verville  MRN:  [id number]  Subjective: [name] reports feeling much better. He is cool and collected. He is no longer upset by his peers and staff. He stopped writing copious letters. He now on Midol only twice in 2 days. Last night he got ahold over" and found for group homes that he has never been to. He wants the social worker checked the places if any beds are available. He accepts medications and tolerates them well. There is slight tremor that doesn't bother him. Sleep and appetite are good. There is better participation as the patient is less intrusive.  Principal Problem: Schizoaffective disorder, bipolar type (HCC) Diagnosis:   Patient Active Problem List   Diagnosis Date Noted  . Tobacco use disorder [F17.200] 12/27/2014  . HIV positive (HCC) [Z21] 12/26/2014  . Hypertension [I10] 12/26/2014  . Hypothyroidism [E03.9] 12/26/2014  . Dyslipidemia [E78.5] 12/26/2014  . Schizoaffective disorder, bipolar type (HCC) [F25.0] 10/25/2014  . Alcohol abuse [F10.10] 10/25/2014   Total Time spent with patient: 20 minutes  Past Psychiatric History:  Long history of schizophrenia.  Past Medical History:  Past Medical History  Diagnosis Date  . HIV (human immunodeficiency virus infection) (HCC)   . BPH (benign prostatic hypertrophy)   . Dyslipidemia   . Hypertension   . Hypothyroidism   . Schizoaffective disorder (HCC)    History reviewed. No pertinent past surgical history. Family History: History reviewed. No pertinent family history. Family Psychiatric  History:  None reported. Social History:  History  Alcohol Use  . 1.2 - 1.8 oz/week  . 2-3 Cans of beer per week     History  Drug Use No    Social History   Social History  . Marital Status: Single    Spouse Name: N/A  . Number of Children: N/A  . Years of Education: N/A   Social History Main Topics  . Smoking status: Current Every Day Smoker -- 2.00 packs/day     Types: Cigarettes  . Smokeless tobacco: None  . Alcohol Use: 1.2 - 1.8 oz/week    2-3 Cans of beer per week  . Drug Use: No  . Sexual Activity: Not Asked   Other Topics Concern  . None   Social History Narrative   Additional Social History:                         Sleep: Good  Appetite:  Good  Current Medications: Current Facility-Administered Medications  Medication Dose Route Frequency Provider Last Rate Last Dose  . acetaminophen (TYLENOL) tablet 650 mg  650 mg Oral Q6H PRN Audery AmelJohn T Clapacs, MD      . alum & mag hydroxide-simeth (MAALOX/MYLANTA) 200-200-20 MG/5ML suspension 30 mL  30 mL Oral Q4H PRN Audery AmelJohn T Clapacs, MD      . aspirin EC tablet 81 mg  81 mg Oral Daily Audery AmelJohn T Clapacs, MD   81 mg at 01/03/15 0918  . benztropine (COGENTIN) tablet 2 mg  2 mg Oral Daily Shari ProwsJolanta B Pucilowska, MD   2 mg at 01/03/15 0919  . carvedilol (COREG) tablet 3.125 mg  3.125 mg Oral BID WC Audery AmelJohn T Clapacs, MD   3.125 mg at 01/03/15 0748  . cholecalciferol (VITAMIN D) tablet 2,000 Units  2,000 Units Oral Daily Audery AmelJohn T Clapacs, MD   2,000 Units at 01/03/15 0919  . divalproex (DEPAKOTE) DR tablet 500 mg  500 mg Oral QHS Jolanta B  Pucilowska, MD   500 mg at 01/02/15 2240  . efavirenz-emtricitabine-tenofovir (ATRIPLA) 600-200-300 MG per tablet 1 tablet  1 tablet Oral QHS Audery Amel, MD   1 tablet at 01/02/15 2240  . fluPHENAZine (PROLIXIN) tablet 5 mg  5 mg Oral BID AC Audery Amel, MD   5 mg at 01/03/15 0748  . fluPHENAZine decanoate (PROLIXIN) injection 50 mg  50 mg Intramuscular Q14 Days Shari Prows, MD   50 mg at 01/01/15 0811  . levothyroxine (SYNTHROID, LEVOTHROID) tablet 25 mcg  25 mcg Oral QAC breakfast Audery Amel, MD   25 mcg at 01/03/15 0733  . lithium carbonate (ESKALITH) CR tablet 900 mg  900 mg Oral QHS Shari Prows, MD   900 mg at 01/02/15 2239  . lithium carbonate (LITHOBID) CR tablet 600 mg  600 mg Oral Q breakfast Jolanta B Pucilowska, MD   600 mg at  01/03/15 0747  . magnesium hydroxide (MILK OF MAGNESIA) suspension 30 mL  30 mL Oral Daily PRN Audery Amel, MD      . paliperidone (INVEGA SUSTENNA) injection 234 mg  234 mg Intramuscular Q28 days Shari Prows, MD   234 mg at 01/01/15 0943  . paliperidone (INVEGA) 24 hr tablet 6 mg  6 mg Oral QHS Jolanta B Pucilowska, MD   6 mg at 01/02/15 2240  . simvastatin (ZOCOR) tablet 20 mg  20 mg Oral q1800 Audery Amel, MD   20 mg at 01/02/15 1744  . tamsulosin (FLOMAX) capsule 0.4 mg  0.4 mg Oral QPC supper Audery Amel, MD   0.4 mg at 01/02/15 1744  . terazosin (HYTRIN) capsule 1 mg  1 mg Oral QHS Audery Amel, MD   1 mg at 01/02/15 2240    Lab Results:  Results for orders placed or performed during the hospital encounter of 12/26/14 (from the past 48 hour(s))  Lithium level     Status: Abnormal   Collection Time: 01/02/15  7:07 AM  Result Value Ref Range   Lithium Lvl 0.50 (L) 0.60 - 1.20 mmol/L    Physical Findings: AIMS:  , ,  ,  ,    CIWA:    COWS:     Musculoskeletal: Strength & Muscle Tone: within normal limits Gait & Station: normal Patient leans: N/A  Psychiatric Specialty Exam: Review of Systems  All other systems reviewed and are negative.   Blood pressure 148/81, pulse 79, temperature 98.5 F (36.9 C), temperature source Oral, resp. rate 20, height 6' (1.829 m), weight 79.379 kg (175 lb), SpO2 100 %.Body mass index is 23.73 kg/(m^2).  General Appearance: Casual  Eye Contact::  Good  Speech:  Clear and Coherent  Volume:  Normal  Mood:  Euphoric  Affect:  Labile  Thought Process:  Goal Directed  Orientation:  Full (Time, Place, and Person)  Thought Content:  Delusions and Paranoid Ideation  Suicidal Thoughts:  No  Homicidal Thoughts:  No  Memory:  Immediate;   Fair Recent;   Fair Remote;   Fair  Judgement:  Impaired  Insight:  Present  Psychomotor Activity:  Normal  Concentration:  Fair  Recall:  [employer] of Knowledge:Fair  Language: Fair   Akathisia:  No  Handed:  Right  AIMS (if indicated):     Assets:  Communication Skills Desire for Improvement Financial Resources/Insurance Physical Health Resilience  ADL's:  Intact  Cognition: WNL  Sleep:  Number of Hours: 6   Treatment Plan Summary:  Daily contact with patient to assess and evaluate symptoms and progress in treatment and Medication management   Mr. Tiberio is a 54 year old male with long history of schizoaffective disorder admitted for agitated and threatening behavior at the group home in the context of medication noncompliance.  1. Agitation. This has resolved.  2. Mood and psychosis. We restarted lithium for mood stabilization and Prolixin for psychosis. He was given injections of [place] and Prolixin Decanoate to improve compliance. Lithium level is 0.5. Will increase Lithium to 1200 mg and add low dose depakote. He has a history of high ammonia on Depakote.  3. Hypothyroidism. We'll continue Synthroid. TSH is normal.  4. HIV. We will continue Atripla.  5. Hypertension. We will continue antihypertensives.  6. Dyslipidemia. We'll continue Zocor. Lipid panel is not elevated.  7. BPH. We will continue Flomax.  8. Social. The patient is not allowed to return to his group home. Placement a must.  9. Disposition. To be established.   Jolanta Pucilowska 01/03/2015, 1:20 PM

## 2015-01-03 NOTE — [facility] Group Notes (Signed)
[facility] Group Notes:  (Nursing/MHT/Case Management/Adjunct)  Date:  01/03/2015  Time:  2:20 PM  Type of Therapy:  Group Therapy  Participation Level:  Active  Participation Quality:  Appropriate  Affect:  Appropriate  Cognitive:  Appropriate  Insight:  Appropriate  Engagement in Group:  Monopolizing  Modes of Intervention:  Activity  Summary of Progress/Problems:  Mayra NeerJackie L Amarylis Rovito 01/03/2015, 2:20 PM

## 2015-01-03 NOTE — Progress Notes (Signed)
Recreation Therapy Notes  Date: 10.13.16 Time: 3:00 pm Location: Craft Room  Group Topic: Leisure Education   Goal Area(s) Addresses:  Patient will identify activities for each letter of the alphabet. Patient will verbalize ability to integrate positive leisure into life post d/c. Patient will verbalize ability to use leisure as a Associate Professorcoping skill.  Behavioral Response: Attentive, Interactive, Left early  Intervention: Leisure Alphabet  Activity: Patients were given a leisure alphabet worksheet and instructed to write a healthy leisure activity for each letter of the alphabet.  Education: LRT educated patients on what they needed to participate in leisure.  Education Outcome: In group clarification offered   Clinical Observations/Feedback: Patient completed activity by filling worksheet. Patient wrote some activities like "jumping" but put some words down like "eyes". Patient contributed to group discussion by stating what he wrote down for certain letters. Patient left group at approximately 3:40 pm. Patient did not return to group.  Jacquelynn CreeGreene,Chirag Krueger M, LRT/CTRS 01/03/2015 4:42 PM

## 2015-01-03 NOTE — Progress Notes (Signed)
D: Pt denies SI/HI/AVH. Pt is very upset about his ACT team and does not want to go to the Group home.   A: Pt was offered support and encouragement. Pt was given scheduled medications. Pt was encourage to attend groups. Q 15 minute checks were done for safety.   R:Pt attends groups and interacts well with peers and staff. Pt is taking medication. Pt receptive to treatment and safety maintained on unit.

## 2015-01-03 NOTE — Progress Notes (Signed)
Patient ID: [name], male   DOB: 1960/05/17, 54 y.o.   MRN: [id number] PER STATE REGULATIONS 482.30  THIS CHART WAS REVIEWED FOR MEDICAL NECESSITY WITH RESPECT TO THE PATIENT'S ADMISSION/ DURATION OF STAY.  NEXT REVIEW DATE:   12/30/2014 Willa RoughJENNIFER JONES Blenda Wisecup, RN, BSN CASE MANAGER

## 2015-01-03 NOTE — Progress Notes (Signed)
Patient ID: [name], male   DOB: 1960-12-01, 54 y.o.   MRN: [id number] PER STATE REGULATIONS 482.30  THIS CHART WAS REVIEWED FOR MEDICAL NECESSITY WITH RESPECT TO THE PATIENT'S ADMISSION/ DURATION OF STAY.  NEXT REVIEW DATE:  01/03/2015  [name], RN, BSN CASE MANAGER

## 2015-01-03 NOTE — Plan of Care (Signed)
Problem: Alteration in mood Goal: LTG-Patient reports reduction in suicidal thoughts (Patient reports reduction in suicidal thoughts and is able to verbalize a safety plan for whenever patient is feeling suicidal)  Outcome: Progressing Denies suicidal thoughts      

## 2015-01-03 NOTE — Progress Notes (Signed)
Patient ID: [name], male   DOB: Aug 03, 1960, 54 y.o.   MRN: [id number] PER STATE REGULATIONS 482.30  THIS CHART WAS REVIEWED FOR MEDICAL NECESSITY WITH RESPECT TO THE PATIENT'S ADMISSION/ DURATION OF STAY.  NEXT REVIEW DATE:   01/07/2015  Willa RoughJENNIFER JONES [name], RN, BSN CASE MANAGER

## 2015-01-03 NOTE — Progress Notes (Signed)
NUTRITION NOTE:   Daniel MoundJaime Benitez is a 54 y.o. male with Schizoaffective disorder, bipolar type (HCC)  PMH:  Past Medical History  Diagnosis Date  . HIV (human immunodeficiency virus infection) (HCC)   . BPH (benign prostatic hypertrophy)   . Dyslipidemia   . Hypertension   . Hypothyroidism   . Schizoaffective disorder (HCC)     Diet Order: Regular  Current Nutrition: pt eating mostly 90-100% of all meal trays  Anthropometrics:  Body mass index is 23.73 kg/(m^2).   Pt is at no nutrition risk due to diagnosis/current problem, BMI of 24, Regular diet order, 90-100% po intake. No nutrition intervention warranted at this time. Will sign off. Please re-consult RD if nutritional issues arise.   Romelle Starcherate Janesha Brissette MS, RD, LDN [phone] Pager

## 2015-01-03 NOTE — Plan of Care (Signed)
Problem: Ineffective individual coping Goal: LTG: Patient will report a decrease in negative feelings Outcome: Not Progressing Pt was upset most of the evening  Problem: Alteration in mood Goal: LTG-Patient reports reduction in suicidal thoughts (Patient reports reduction in suicidal thoughts and is able to verbalize a safety plan for whenever patient is feeling suicidal)  Outcome: Progressing Pt denies SI at this time

## 2015-01-04 NOTE — Plan of Care (Signed)
Problem: Spiritual Needs Goal: Ability to function at adequate level Outcome: Progressing Patient is taking medications as prescribed and is compliant with care.  Patient is able to function at a adequate level.

## 2015-01-04 NOTE — Tx Team (Signed)
Interdisciplinary Treatment Plan Update (Adult)  Date:  01/04/2015 Time Reviewed:  3:14 PM  Progress in Treatment: Attending groups: Yes. Participating in groups:  Yes. Taking medication as prescribed:  Yes. Tolerating medication:  Yes. Family/Significant othe contact made:  Yes, individual(s) contacted:  Act Team Patient understands diagnosis:  No. and As evidenced by:  Limited insight  Discussing patient identified problems/goals with staff:  Yes. Medical problems stabilized or resolved:  Yes. Denies suicidal/homicidal ideation: Yes. Issues/concerns per patient self-inventory:  Yes. Other:  New problem(s) identified: No, Describe:  Na  Discharge Plan or Barriers: Pt plans to go to a group home and follow up with his ACT team.   Reason for Continuation of Hospitalization: Aggression Mania Medication stabilization Suicidal ideation  Comments:[name] reports feeling much better. He is cool and collected. He is no longer upset by his peers and staff. He stopped writing copious letters. He now on Midol only twice in 2 days. Last night he got ahold over" and found for group homes that he has never been to. He wants the social worker checked the places if any beds are available. He accepts medications and tolerates them well. There is slight tremor that doesn't bother him. Sleep and appetite are good. There is better participation as the patient is less intrusive.  Estimated length of stay: 7 days   New goal(s):  Review of initial/current patient goals per problem list:   1.  Goal(s): Patient will participate in aftercare plan * Met:  * Target date: at discharge * As evidenced by: Patient will participate within aftercare plan AEB aftercare provider and housing plan at discharge being identified.   2.  Goal (s): Patient will exhibit decreased depressive symptoms and suicidal ideations. * Met:  *  Target date: at discharge * As evidenced by: Patient will utilize self rating of  depression at 3 or below and demonstrate decreased signs of depression or be deemed stable for discharge by MD.   3.  Goal(s): Patient will demonstrate decreased signs and symptoms of anxiety. * Met:  * Target date: at discharge * As evidenced by: Patient will utilize self rating of anxiety at 3 or below and demonstrated decreased signs of anxiety, or be deemed stable for discharge by MD  4.  Goal (s): Patient will demonstrate decreased symptoms of psychosis. * Met: No  *  Target date: at discharge * As evidenced by: Patient will not endorse signs of psychosis or be deemed stable for discharge by MD.   Attendees: Patient:  [name] 10/14/20163:14 PM  Family:   10/14/20163:14 PM  Physician:  Dr. Bary Leriche  10/14/20163:14 PM  Nursing:   Meredith Mody, RN  10/14/20163:14 PM  Case Manager:   10/14/20163:14 PM  Counselor:   10/14/20163:14 PM  Other:  Wray Kearns, [place] 10/14/20163:14 PM  Other:  Everitt Amber, [place]  10/14/20163:14 PM  Other:   10/14/20163:14 PM  Other:  10/14/20163:14 PM  Other:  10/14/20163:14 PM  Other:  10/14/20163:14 PM  Other:  10/14/20163:14 PM  Other:  10/14/20163:14 PM  Other:  10/14/20163:14 PM  Other:   10/14/20163:14 PM   Scribe for Treatment Team:   Wray Kearns, MSW, [place]  01/04/2015, 3:14 PM

## 2015-01-04 NOTE — Progress Notes (Signed)
Recreation Therapy Notes  Date: 10.14.16 Time: 3:00 pm Location: Craft Room  Group Topic: Coping Skills  Goal Area(s) Addresses:  Patient will participate in coping skills. Patient will verbalize benefit of using art as a coping skill.  Behavioral Response: Attentive, Interactive, Disruptive  Intervention: Coloring  Activity: Patients were given coloring sheets and instructed to color thinking about what emotions they were feeling and what they were focused on.  Education: LRT educated patients on why art is a good Associate Professorcoping skill.   Education Outcome: In group clarification offered   Clinical Observations/Feedback: Patient colored 4 sheets. Patient contributed to group discussion by stating how he felt while coloring. LRT had to redirect patient multiple times from having side conversations and talking about group homes. Patient left group at approximately 3:28 pm and returned at 3:33 pm.  Jacquelynn CreeGreene,Channing Savich M, LRT/CTRS 01/04/2015 5:12 PM

## 2015-01-04 NOTE — Plan of Care (Signed)
Problem: Consults Goal: [facility] General Treatment Patient Education Outcome: Progressing Pt is progressing towards achieving this goal.     

## 2015-01-04 NOTE — Progress Notes (Signed)
Pt denies any SI/HI/VAH, appears to be in a much improved mood, said he'll will be discharging next Friday but that he'll be back again. Remains compliant with medication regimen, will continue to monitor for safety.

## 2015-01-04 NOTE — Progress Notes (Signed)
Patient ID: [name], male   DOB: 1960/09/16, 54 y.o.   MRN: [id number]  [facility]   Daisy FloroCandace L Brettney Ficken MSW, LCSWA  01/04/2015 11:05 AM

## 2015-01-04 NOTE — [facility] Group Notes (Signed)
[facility] LCSW Group Therapy  01/04/2015 12:34 PM  Type of Therapy:  Group Therapy  Participation Level:  Active  Participation Quality:  Appropriate and Attentive  Affect:  Appropriate  Cognitive:  Alert, Appropriate and Oriented  Insight:  Engaged  Engagement in Therapy:  Engaged  Modes of Intervention:  Socialization and Support  Summary of Progress/Problems: Patient attended group and participated appropriately. Patient introduced himself and participated in an introduction exercise sharing the 3 things he would take with him [place] stranded [place] were "a flashlight, paper, and a pen" as patient shared he likes to write. Patient participated in a group discussion about coping skills and shared that he has to cope with anger sometimes.   Lulu RidingIngle, Esequiel Kleinfelter T, MSW, LCSWA 01/04/2015, 12:34 PM

## 2015-01-04 NOTE — [facility] Group Notes (Signed)
[facility]   01/04/2015 10:48 AM  Participation Quality:  Patient attended group and shared his SMART goal is to "go to groups and work on my discharge plan". Patient asked "do you want me to leave" as patient writes during group and one of the group rules is no writing during group discussion however this writer allows patient to write as he is not disruptive and does participate during group discussion.   Mood/Affect:  Labile  Thoughts of Suicide:  No Will you contract for safety?   NA  Current AVH:  No   Daniel RidingIngle, [name] T, MSW, [employer]LCSWA

## 2015-01-04 NOTE — Progress Notes (Signed)
[facility] MD Progress Note  01/04/2015 8:00 PM [name]  MRN:  [id number]  Subjective:  [name] has a long history of schizoaffective disorder admitted for manic psychotic break in the context of treatment noncompliance.  [name] has much improved. He no longer is intrusive and disorganized. He is able to participate in discharge planning and wihes to be placed in a group home. He initially reused placement. He has been hospitalized frequently and burned many bridge with local group home owners. We are negotiating additional funds from [employer]. Sleep and appetite are good. He is able to participate in programming. He tolerates medications well.   Principal Problem: Schizoaffective disorder, bipolar type (HCC) Diagnosis:   Patient Active Problem List   Diagnosis Date Noted  . Tobacco use disorder [F17.200] 12/27/2014  . HIV positive (HCC) [Z21] 12/26/2014  . Hypertension [I10] 12/26/2014  . Hypothyroidism [E03.9] 12/26/2014  . Dyslipidemia [E78.5] 12/26/2014  . Schizoaffective disorder, bipolar type (HCC) [F25.0] 10/25/2014  . Alcohol abuse [F10.10] 10/25/2014   Total Time spent with patient: 20 minutes  Past Psychiatric History: Long history of psychosis and mood instability.  Past Medical History:  Past Medical History  Diagnosis Date  . HIV (human immunodeficiency virus infection) (HCC)   . BPH (benign prostatic hypertrophy)   . Dyslipidemia   . Hypertension   . Hypothyroidism   . Schizoaffective disorder (HCC)    History reviewed. No pertinent past surgical history. Family History: History reviewed. No pertinent family history. Family Psychiatric  History: None reported. Social History:  History  Alcohol Use  . 1.2 - 1.8 oz/week  . 2-3 Cans of beer per week     History  Drug Use No    Social History   Social History  . Marital Status: Single    Spouse Name: N/A  . Number of Children: N/A  . Years of Education: N/A   Social History Main Topics   . Smoking status: Current Every Day Smoker -- 2.00 packs/day    Types: Cigarettes  . Smokeless tobacco: None  . Alcohol Use: 1.2 - 1.8 oz/week    2-3 Cans of beer per week  . Drug Use: No  . Sexual Activity: Not Asked   Other Topics Concern  . None   Social History Narrative   Additional Social History:                         Sleep: Good  Appetite:  Good  Current Medications: Current Facility-Administered Medications  Medication Dose Route Frequency Provider Last Rate Last Dose  . acetaminophen (TYLENOL) tablet 650 mg  650 mg Oral Q6H PRN Audery Amel, MD      . alum & mag hydroxide-simeth (MAALOX/MYLANTA) 200-200-20 MG/5ML suspension 30 mL  30 mL Oral Q4H PRN Audery Amel, MD      . aspirin EC tablet 81 mg  81 mg Oral Daily Audery Amel, MD   81 mg at 01/04/15 0941  . benztropine (COGENTIN) tablet 2 mg  2 mg Oral Daily Shari Prows, MD   2 mg at 01/04/15 0941  . carvedilol (COREG) tablet 3.125 mg  3.125 mg Oral BID WC Audery Amel, MD   3.125 mg at 01/04/15 0803  . cholecalciferol (VITAMIN D) tablet 2,000 Units  2,000 Units Oral Daily Audery Amel, MD   2,000 Units at 01/04/15 0941  . divalproex (DEPAKOTE) DR tablet 500 mg  500 mg Oral QHS Maryanna Stuber B  Keasha Malkiewicz, MD   500 mg at 01/03/15 2124  . efavirenz-emtricitabine-tenofovir (ATRIPLA) 600-200-300 MG per tablet 1 tablet  1 tablet Oral QHS Audery Amel, MD   1 tablet at 01/03/15 2124  . fluPHENAZine (PROLIXIN) tablet 5 mg  5 mg Oral BID AC Audery Amel, MD   5 mg at 01/04/15 0803  . fluPHENAZine decanoate (PROLIXIN) injection 50 mg  50 mg Intramuscular Q14 Days Shari Prows, MD   50 mg at 01/01/15 0811  . levothyroxine (SYNTHROID, LEVOTHROID) tablet 25 mcg  25 mcg Oral QAC breakfast Audery Amel, MD   25 mcg at 01/04/15 0803  . lithium carbonate (ESKALITH) CR tablet 900 mg  900 mg Oral QHS Shari Prows, MD   900 mg at 01/03/15 2124  . lithium carbonate (LITHOBID) CR tablet 600 mg   600 mg Oral Q breakfast Baldemar Dady B Maysen Bonsignore, MD   600 mg at 01/04/15 0803  . magnesium hydroxide (MILK OF MAGNESIA) suspension 30 mL  30 mL Oral Daily PRN Audery Amel, MD      . paliperidone (INVEGA SUSTENNA) injection 234 mg  234 mg Intramuscular Q28 days Shari Prows, MD   234 mg at 01/01/15 0943  . paliperidone (INVEGA) 24 hr tablet 6 mg  6 mg Oral QHS Shari Prows, MD   6 mg at 01/03/15 2124  . simvastatin (ZOCOR) tablet 20 mg  20 mg Oral q1800 Audery Amel, MD   20 mg at 01/03/15 1749  . tamsulosin (FLOMAX) capsule 0.4 mg  0.4 mg Oral QPC supper Audery Amel, MD   0.4 mg at 01/03/15 1750  . terazosin (HYTRIN) capsule 1 mg  1 mg Oral QHS Audery Amel, MD   1 mg at 01/03/15 2124    Lab Results: No results found for this or any previous visit (from the past 48 hour(s)).  Physical Findings: AIMS:  , ,  ,  ,    CIWA:    COWS:     Musculoskeletal: Strength & Muscle Tone: within normal limits Gait & Station: normal Patient leans: N/A  Psychiatric Specialty Exam: Review of Systems  Psychiatric/Behavioral: The patient is nervous/anxious.   All other systems reviewed and are negative.   Blood pressure 151/96, pulse 87, temperature 98.6 F (37 C), temperature source Oral, resp. rate 20, height 6' (1.829 m), weight 79.379 kg (175 lb), SpO2 100 %.Body mass index is 23.73 kg/(m^2).  General Appearance: Casual  Eye Contact::  Good  Speech:  Clear and Coherent  Volume:  Normal  Mood:  Euphoric  Affect:  Labile  Thought Process:  Goal Directed  Orientation:  Full (Time, Place, and Person)  Thought Content:  WDL  Suicidal Thoughts:  No  Homicidal Thoughts:  No  Memory:  Immediate;   Fair Recent;   Fair Remote;   Fair  Judgement:  Fair  Insight:  Fair  Psychomotor Activity:  Increased  Concentration:  Fair  Recall:  [employer] of Knowledge:Fair  Language: Fair  Akathisia:  No  Handed:  Right  AIMS (if indicated):     Assets:  Communication  Skills Desire for Improvement Financial Resources/Insurance Physical Health Resilience  ADL's:  Intact  Cognition: WNL  Sleep:  Number of Hours: 7.15   Treatment Plan Summary: Daily contact with patient to assess and evaluate symptoms and progress in treatment and Medication management   [name] is a 54 year old male with long history of schizoaffective disorder admitted for agitated and  threatening behavior at the group home in the context of medication noncompliance.  1. Agitation. This has resolved.  2. Mood and psychosis. We restarted lithium for mood stabilization and Prolixin for psychosis. He was given injections of [place]vega Sustenna and Prolixin Decanoate to improve compliance. Lithium level is 0.5. Will increase Lithium to 1500 mg and add low dose depakote. He has a history of high ammonia on Depakote.  3. Hypothyroidism. We'll continue Synthroid. TSH is normal.  4. HIV. We will continue Atripla.  5. Hypertension. We will continue antihypertensives.  6. Dyslipidemia. We'll continue Zocor. Lipid panel is not elevated.  7. BPH. We will continue Flomax.  8. Social. The patient is not allowed to return to his group home. Placement a must.  9. Disposition. To be established.   Veda Arrellano 01/04/2015, 8:00 PM

## 2015-01-04 NOTE — Plan of Care (Signed)
Problem: Spiritual Needs Goal: Ability to function at adequate level Outcome: Progressing Pt functions adequately.   Problem: [facility] Education Outcome: Not Progressing Patient perseverates on medication and is unable to state why he does not want what is prescribed other than to note when he leaves the hospital he is going to live on the streets. He states he does not like any of the 21 group homes he has lived in so far, and when he tells people he has had that many placements they no longer want to take him. He does state he wants placement but has not found the right home.   Problem: Ineffective individual coping Goal: LTG: Patient will report a decrease in negative feelings Outcome: Not Progressing Not progressing.  Goal: STG: Pt will be able to identify effective and ineffective STG: Pt will be able to identify effective and ineffective coping patterns  Outcome: Not Progressing Patient perseverating on medication and unable to process any other information. Does not recognize the need for coping skills as he is currently blaming others.  Goal: STG: Patient will remain free from self harm Outcome: Progressing No self harm.

## 2015-01-04 NOTE — [facility] Group Notes (Signed)
[facility] LCSW Group Therapy  01/04/2015 3:06 PM  Type of Therapy:  Group Therapy  Participation Level:  Minimal  Participation Quality:  Inattentive  Affect:  Labile  Cognitive:  Disorganized  Insight:  Limited  Engagement in Therapy:  Limited  Modes of Intervention:  Discussion, Education, Socialization and Support  Summary of Progress/Problems:Feelings around Relapse. Group members discussed the meaning of relapse and shared personal stories of relapse, how it affected them and others, and how they perceived themselves during this time. Group members were encouraged to identify triggers, warning signs and coping skills used when facing the possibility of relapse. Social supports were discussed and explored in detail. Marijean NiemannJaime attended group and stayed most of the time. He had difficulties staying on topic. He is very focused on being discharged and writing his "[place]" letters.   [name] L [name] MSW, LCSWA  01/04/2015, 3:06 PM

## 2015-01-05 NOTE — [facility] Group Notes (Signed)
[facility] Group Notes:  (Nursing/MHT/Case Management/Adjunct)  Date:  01/05/2015  Time:  6:22 PM  Type of Therapy:  Group Therapy  Participation Level:  Did Not Attend  Participation Quality:  N/A  Affect:  N/A  Cognitive:  N/A  Insight:  None  Engagement in Group:  N/A  Modes of Intervention:  N/A  Summary of Progress/Problems:  [name] 01/05/2015, 6:22 PM

## 2015-01-05 NOTE — [facility] Group Notes (Signed)
[facility] LCSW Group Therapy  01/05/2015 11:08 AM  Type of Therapy:  Group Therapy  Participation Level:  Did Not Attend  Modes of Intervention:  Discussion, Education, Socialization and Support  Summary of Progress/Problems: Balance in life: Patients will discuss the concept of balance and how it looks and feels to be unbalanced. Pt will identify areas in their life that is unbalanced and ways to become more balanced.    Joesiah Lonon L Vangie Henthorn MSW, LCSWA  01/05/2015, 11:08 AM  

## 2015-01-05 NOTE — Progress Notes (Signed)
[facility] MD Progress Note  01/05/2015 10:56 AM [name]  MRN:  [id number]  Subjective:  [name] has a long history of schizoaffective disorder admitted for manic psychotic break in the context of treatment noncompliance.  [name] has much improved. He no longer is intrusive and disorganized. Patient is fixated on wanting to be discharged to the streets. He refuses to go to a group home. "I belong to the streets". Patient states he plans to refuse some of his medications because he feels he is taking too many. Nurses report patient has refused some of his medications at times.  He has been somewhat argumentative at times. No episodes of agitation, or aggression.  . When asked about suicidality, homicidality or hallucinations the patient is states "I can handle that".  He was calm and calm while talking to me but is still somewhat hyperverbal and perseverates on the placement issues.   Per nursing: Patient refused all but three of his medications. States his goal is to live on the street, do drugs and drink. Also states he would like to live alone but cannot afford it. States he has had 21 group homes and has not like any of them. Argumentative in regards to medications, redirected and calmed continually until he went to bed. A copy of his medication list was given to him per his request. He reports his intention to take certain medications, which he wrote on the med sheet and plans to discuss with the doctor. No needs were noted. Patient disruptive in the milieu with loud talking and perseveration on medications. Denies current SI, HI, and AVH. Reports all of his actions are geared toward his living on the street. Unable to process any alternative at this time.   Principal Problem: Schizoaffective disorder, bipolar type (HCC) Diagnosis:   Patient Active Problem List   Diagnosis Date Noted  . Tobacco use disorder [F17.200] 12/27/2014  . HIV positive (HCC) [Z21] 12/26/2014  . Hypertension [I10]  12/26/2014  . Hypothyroidism [E03.9] 12/26/2014  . Dyslipidemia [E78.5] 12/26/2014  . Schizoaffective disorder, bipolar type (HCC) [F25.0] 10/25/2014  . Alcohol abuse [F10.10] 10/25/2014   Total Time spent with patient: 30 minutes  Past Psychiatric History: Long history of psychosis and mood instability.  Past Medical History:  Past Medical History  Diagnosis Date  . HIV (human immunodeficiency virus infection) (HCC)   . BPH (benign prostatic hypertrophy)   . Dyslipidemia   . Hypertension   . Hypothyroidism   . Schizoaffective disorder (HCC)    History reviewed. No pertinent past surgical history. Family History: History reviewed. No pertinent family history. Family Psychiatric  History: None reported. Social History:  History  Alcohol Use  . 1.2 - 1.8 oz/week  . 2-3 Cans of beer per week     History  Drug Use No    Social History   Social History  . Marital Status: Single    Spouse Name: N/A  . Number of Children: N/A  . Years of Education: N/A   Social History Main Topics  . Smoking status: Current Every Day Smoker -- 2.00 packs/day    Types: Cigarettes  . Smokeless tobacco: None  . Alcohol Use: 1.2 - 1.8 oz/week    2-3 Cans of beer per week  . Drug Use: No  . Sexual Activity: Not Asked   Other Topics Concern  . None   Social History Narrative    Sleep: Fair  Appetite:  Good  Current Medications: Current Facility-Administered Medications  Medication Dose  Route Frequency Provider Last Rate Last Dose  . acetaminophen (TYLENOL) tablet 650 mg  650 mg Oral Q6H PRN Audery AmelJohn T Clapacs, MD      . alum & mag hydroxide-simeth (MAALOX/MYLANTA) 200-200-20 MG/5ML suspension 30 mL  30 mL Oral Q4H PRN Audery AmelJohn T Clapacs, MD      . aspirin EC tablet 81 mg  81 mg Oral Daily Audery AmelJohn T Clapacs, MD   81 mg at 01/05/15 0925  . benztropine (COGENTIN) tablet 2 mg  2 mg Oral Daily Shari ProwsJolanta B Pucilowska, MD   2 mg at 01/05/15 0928  . carvedilol (COREG) tablet 3.125 mg  3.125 mg Oral BID  WC Audery AmelJohn T Clapacs, MD   3.125 mg at 01/05/15 0927  . cholecalciferol (VITAMIN D) tablet 2,000 Units  2,000 Units Oral Daily Audery AmelJohn T Clapacs, MD   2,000 Units at 01/05/15 [phone]  . divalproex (DEPAKOTE) DR tablet 500 mg  500 mg Oral QHS Jolanta B Pucilowska, MD   500 mg at 01/04/15 2200  . efavirenz-emtricitabine-tenofovir (ATRIPLA) 600-200-300 MG per tablet 1 tablet  1 tablet Oral QHS Audery AmelJohn T Clapacs, MD   1 tablet at 01/04/15 2200  . fluPHENAZine (PROLIXIN) tablet 5 mg  5 mg Oral BID AC Audery AmelJohn T Clapacs, MD   5 mg at 01/05/15 [id number]  . fluPHENAZine decanoate (PROLIXIN) injection 50 mg  50 mg Intramuscular Q14 Days Shari ProwsJolanta B Pucilowska, MD   50 mg at 01/01/15 0811  . levothyroxine (SYNTHROID, LEVOTHROID) tablet 25 mcg  25 mcg Oral QAC breakfast Audery AmelJohn T Clapacs, MD   25 mcg at 01/05/15 0926  . lithium carbonate (ESKALITH) CR tablet 900 mg  900 mg Oral QHS Shari ProwsJolanta B Pucilowska, MD   900 mg at 01/03/15 2124  . lithium carbonate (LITHOBID) CR tablet 600 mg  600 mg Oral Q breakfast Jolanta B Pucilowska, MD   600 mg at 01/05/15 0925  . magnesium hydroxide (MILK OF MAGNESIA) suspension 30 mL  30 mL Oral Daily PRN Audery AmelJohn T Clapacs, MD      . paliperidone (INVEGA SUSTENNA) injection 234 mg  234 mg Intramuscular Q28 days Shari ProwsJolanta B Pucilowska, MD   234 mg at 01/01/15 0943  . paliperidone (INVEGA) 24 hr tablet 6 mg  6 mg Oral QHS Shari ProwsJolanta B Pucilowska, MD   6 mg at 01/03/15 2124  . simvastatin (ZOCOR) tablet 20 mg  20 mg Oral q1800 Audery AmelJohn T Clapacs, MD   20 mg at 01/03/15 1749  . tamsulosin (FLOMAX) capsule 0.4 mg  0.4 mg Oral QPC supper Audery AmelJohn T Clapacs, MD   0.4 mg at 01/03/15 1750  . terazosin (HYTRIN) capsule 1 mg  1 mg Oral QHS Audery AmelJohn T Clapacs, MD   1 mg at 01/04/15 2241    Lab Results: No results found for this or any previous visit (from the past 48 hour(s)).  Physical Findings: AIMS:  , ,  ,  ,    CIWA:    COWS:     Musculoskeletal: Strength & Muscle Tone: within normal limits Gait & Station: shuffle Patient  leans: Front  Psychiatric Specialty Exam: Review of Systems  Constitutional: Negative.   HENT: Negative.   Eyes: Negative.   Respiratory: Negative.   Cardiovascular: Negative.   Gastrointestinal: Negative.   Genitourinary: Negative.   Musculoskeletal: Negative.   Skin: Negative.   Neurological: Negative.   Endo/Heme/Allergies: Negative.   Psychiatric/Behavioral: Negative.  The patient is not nervous/anxious.     Blood pressure 138/87, pulse 85, temperature 98.2 F (36.8 C),  temperature source Oral, resp. rate 20, height 6' (1.829 m), weight 79.379 kg (175 lb), SpO2 100 %.Body mass index is 23.73 kg/(m^2).  General Appearance: Casual  Eye Contact::  Good  Speech:  Clear and Coherent  Volume:  Normal  Mood:  Euphoric  Affect:  Labile  Thought Process:  Goal Directed  Orientation:  Full (Time, Place, and Person)  Thought Content:  WDL  Suicidal Thoughts:  No  Homicidal Thoughts:  No  Memory:  Immediate;   Fair Recent;   Fair Remote;   Fair  Judgement:  Fair  Insight:  Fair  Psychomotor Activity:  Increased  Concentration:  Fair  Recall:  [employer] of Knowledge:Fair  Language: Fair  Akathisia:  No  Handed:  Right  AIMS (if indicated):     Assets:  Communication Skills Desire for Improvement Financial Resources/Insurance Physical Health Resilience  ADL's:  Intact  Cognition: WNL  Sleep:  Number of Hours: 5.5   Treatment Plan Summary: Daily contact with patient to assess and evaluate symptoms and progress in treatment and Medication management   [name] is a 54 year old male with long history of schizoaffective disorder admitted for agitated and threatening behavior at the group home in the context of medication noncompliance.  1. Agitation. This has resolved.  2. Mood and psychosis. We restarted lithium for mood stabilization and Prolixin for psychosis. He was given injections of [place] and Prolixin Decanoate to improve compliance. Lithium level is  0.5.  Lithium has been increased to 1500 mg and add low dose depakote. He has a history of high ammonia on Depakote. No changes today as patient seems to be improving.  3. Hypothyroidism:  continue Synthroid. TSH is normal.  4. HIV:  continue Atripla.  5. Hypertension: continue antihypertensives.  6. Dyslipidemia: continue Zocor. Lipid panel is not elevated.  7. BPH. We will continue Flomax.  8. Social. The patient is not allowed to return to his group home. Currently homeless. Unable to care for self without supervision.  9. Disposition. To be established.   Jimmy Footman 01/05/2015, 10:56 AM

## 2015-01-05 NOTE — Progress Notes (Signed)
Patient refused all but three of his medications. States his goal is to live on the street, do drugs and drink. Also states he would like to live alone but cannot afford it. States he has had 21 group homes and has not like any of them. Argumentative in regards to medications, redirected and calmed continually until he went to bed. A copy of his medication list was given to him per his request. He reports his intention to take certain medications, which he wrote on the med sheet and plans to discuss with the doctor. No needs were noted. Patient disruptive in the milieu with loud talking and perseveration on medications. Denies current SI, HI, and AVH. Reports all of his actions are geared toward his living on the street. Unable to process any alternative at this time.

## 2015-01-05 NOTE — Progress Notes (Signed)
Pt continues to be argumentative in regards to his medication. Pt states he is taking to many meds and that he will only take 8-pills today. Pt denies having any complaints at this time. Will continue to observe and redirect as needed.

## 2015-01-06 NOTE — Progress Notes (Signed)
Pacing hallways and being loud and intrusive at times. Is easily redirected. Did agree to take Coreg that was refused on previous shift. Was compliant with all PM meds. Requested clean linens for bed change. Stated that he wont stay past Friday and when he gets discharged he will stop taking all his meds so [facility]. Wrote details of his plans in a manifesto placed in chart. Did deny AVH. SI, and HI.

## 2015-01-06 NOTE — [facility] Group Notes (Signed)
[facility] LCSW Group Therapy  01/06/2015 11:50 AM  Type of Therapy:  Group Therapy  Participation Level:  Minimal  Participation Quality:  Inattentive  Affect:  Flat  Cognitive:  Disorganized  Insight:  Limited  Engagement in Therapy:  Limited  Modes of Intervention:  Discussion, Education, Socialization and Support  Summary of Progress/Problems: Pt will identify unhealthy thoughts and how they impact their emotions and behavior. Pt will be encouraged to discuss these thoughts, emotions and behaviors with the group. [name] attended group and stayed the entire time. He sat quietly and listened to other group members.   Daron Breeding L Keya Wynes MSW, LCSWA  01/06/2015, 11:50 AM

## 2015-01-06 NOTE — Plan of Care (Signed)
Problem: Ineffective individual coping Goal: LTG: Patient will report a decrease in negative feelings Outcome: Progressing Patient states he is feeling better    Goal: STG: Patient will remain free from self harm Outcome: Progressing No self harm.

## 2015-01-06 NOTE — Progress Notes (Signed)
[facility] MD Progress Note  01/06/2015 8:14 AM [name]  MRN:  [id number]  Subjective:  [name] has a long history of schizoaffective disorder admitted for manic psychotic break in the context of treatment noncompliance.  [name] has much improved. He no longer is intrusive and disorganized. Patient is fixated on wanting to be discharged to the streets. He refuses to go to a group home. "I belong to the streets". Patient states he plans to refuse some of his medications because he feels he is taking too many. Nurses report patient has refused some of his medications at times.  He has been somewhat argumentative at times. No episodes of agitation, or aggression.  . When asked about suicidality, homicidality or hallucinations the patient is states "I can handle that".  He was calm and calm while talking to me.  Later in the day he was seen playing basketball with peers appears to have appropriate interactions with them. Does not seem agitated.  Per nursing: Pacing hallways and being loud and intrusive at times. Is easily redirected. Did agree to take Coreg that was refused on previous shift. Was compliant with all PM meds. Requested clean linens for bed change. Stated that he wont stay past Friday and when he gets discharged he will stop taking all his meds [facility]. Wrote details of his plans in a manifesto placed in chart. Did deny AVH. SI, and HI.          Principal Problem: Schizoaffective disorder, bipolar type (HCC) Diagnosis:   Patient Active Problem List   Diagnosis Date Noted  . Tobacco use disorder [F17.200] 12/27/2014  . HIV positive (HCC) [Z21] 12/26/2014  . Hypertension [I10] 12/26/2014  . Hypothyroidism [E03.9] 12/26/2014  . Dyslipidemia [E78.5] 12/26/2014  . Schizoaffective disorder, bipolar type (HCC) [F25.0] 10/25/2014  . Alcohol abuse [F10.10] 10/25/2014   Total Time spent with patient: 30 minutes  Past Psychiatric History:  Long history of psychosis and mood instability.  Past Medical History:  Past Medical History  Diagnosis Date  . HIV (human immunodeficiency virus infection) (HCC)   . BPH (benign prostatic hypertrophy)   . Dyslipidemia   . Hypertension   . Hypothyroidism   . Schizoaffective disorder (HCC)    History reviewed. No pertinent past surgical history. Family History: History reviewed. No pertinent family history. Family Psychiatric  History: None reported. Social History:  History  Alcohol Use  . 1.2 - 1.8 oz/week  . 2-3 Cans of beer per week     History  Drug Use No    Social History   Social History  . Marital Status: Single    Spouse Name: N/A  . Number of Children: N/A  . Years of Education: N/A   Social History Main Topics  . Smoking status: Current Every Day Smoker -- 2.00 packs/day    Types: Cigarettes  . Smokeless tobacco: None  . Alcohol Use: 1.2 - 1.8 oz/week    2-3 Cans of beer per week  . Drug Use: No  . Sexual Activity: Not Asked   Other Topics Concern  . None   Social History Narrative    Sleep: Fair  Appetite:  Good  Current Medications: Current Facility-Administered Medications  Medication Dose Route Frequency Provider Last Rate Last Dose  . acetaminophen (TYLENOL) tablet 650 mg  650 mg Oral Q6H PRN Audery Amel, MD      . alum & mag hydroxide-simeth (MAALOX/MYLANTA) 200-200-20 MG/5ML suspension 30 mL  30 mL Oral Q4H PRN Audery AmelJohn T Clapacs, MD      . aspirin EC tablet 81 mg  81 mg Oral Daily Audery AmelJohn T Clapacs, MD   81 mg at 01/05/15 0925  . benztropine (COGENTIN) tablet 2 mg  2 mg Oral Daily Shari ProwsJolanta B Pucilowska, MD   2 mg at 01/05/15 0928  . carvedilol (COREG) tablet 3.125 mg  3.125 mg Oral BID WC Audery AmelJohn T Clapacs, MD   3.125 mg at 01/05/15 2057  . cholecalciferol (VITAMIN D) tablet 2,000 Units  2,000 Units Oral Daily Audery AmelJohn T Clapacs, MD   2,000 Units at 01/05/15 [phone]  . divalproex (DEPAKOTE) DR tablet 500 mg  500 mg Oral QHS Jolanta B Pucilowska, MD   500  mg at 01/05/15 2100  . efavirenz-emtricitabine-tenofovir (ATRIPLA) 600-200-300 MG per tablet 1 tablet  1 tablet Oral QHS Audery AmelJohn T Clapacs, MD   1 tablet at 01/05/15 2100  . fluPHENAZine (PROLIXIN) tablet 5 mg  5 mg Oral BID AC Audery AmelJohn T Clapacs, MD   5 mg at 01/05/15 [id number]  . fluPHENAZine decanoate (PROLIXIN) injection 50 mg  50 mg Intramuscular Q14 Days Shari ProwsJolanta B Pucilowska, MD   50 mg at 01/01/15 0811  . levothyroxine (SYNTHROID, LEVOTHROID) tablet 25 mcg  25 mcg Oral QAC breakfast Audery AmelJohn T Clapacs, MD   25 mcg at 01/06/15 [phone]  . lithium carbonate (ESKALITH) CR tablet 900 mg  900 mg Oral QHS Shari ProwsJolanta B Pucilowska, MD   900 mg at 01/05/15 2100  . lithium carbonate (LITHOBID) CR tablet 600 mg  600 mg Oral Q breakfast Jolanta B Pucilowska, MD   600 mg at 01/05/15 0925  . magnesium hydroxide (MILK OF MAGNESIA) suspension 30 mL  30 mL Oral Daily PRN Audery AmelJohn T Clapacs, MD      . paliperidone (INVEGA SUSTENNA) injection 234 mg  234 mg Intramuscular Q28 days Shari ProwsJolanta B Pucilowska, MD   234 mg at 01/01/15 0943  . paliperidone (INVEGA) 24 hr tablet 6 mg  6 mg Oral QHS Jolanta B Pucilowska, MD   6 mg at 01/05/15 2100  . simvastatin (ZOCOR) tablet 20 mg  20 mg Oral q1800 Audery AmelJohn T Clapacs, MD   20 mg at 01/03/15 1749  . tamsulosin (FLOMAX) capsule 0.4 mg  0.4 mg Oral QPC supper Audery AmelJohn T Clapacs, MD   0.4 mg at 01/03/15 1750  . terazosin (HYTRIN) capsule 1 mg  1 mg Oral QHS Audery AmelJohn T Clapacs, MD   1 mg at 01/05/15 2100    Lab Results: No results found for this or any previous visit (from the past 48 hour(s)).  Physical Findings: AIMS:  , ,  ,  ,    CIWA:    COWS:     Musculoskeletal: Strength & Muscle Tone: within normal limits Gait & Station: shuffle Patient leans: Front  Psychiatric Specialty Exam: Review of Systems  Constitutional: Negative.   HENT: Negative.   Eyes: Negative.   Respiratory: Negative.   Cardiovascular: Negative.   Gastrointestinal: Negative.   Genitourinary: Negative.   Musculoskeletal:  Negative.   Skin: Negative.   Neurological: Negative.   Endo/Heme/Allergies: Negative.   Psychiatric/Behavioral: Negative.  The patient is not nervous/anxious.     Blood pressure 138/93, pulse 75, temperature 97.6 F (36.4 C), temperature source Oral, resp. rate 20, height 6' (1.829 m), weight 79.379 kg (175 lb), SpO2 100 %.Body mass index is 23.73 kg/(m^2).  General Appearance: Casual  Eye Contact::  Good  Speech:  Clear and Coherent  Volume:  Normal  Mood:  Euphoric  Affect:  Labile  Thought Process:  Goal Directed  Orientation:  Full (Time, Place, and Person)  Thought Content:  WDL  Suicidal Thoughts:  No  Homicidal Thoughts:  No  Memory:  Immediate;   Fair Recent;   Fair Remote;   Fair  Judgement:  Fair  Insight:  Fair  Psychomotor Activity:  Increased  Concentration:  Fair  Recall:  [employer] of Knowledge:Fair  Language: Fair  Akathisia:  No  Handed:  Right  AIMS (if indicated):     Assets:  Communication Skills Desire for Improvement Financial Resources/Insurance Physical Health Resilience  ADL's:  Intact  Cognition: WNL  Sleep:  Number of Hours: 5.5   Treatment Plan Summary: Daily contact with patient to assess and evaluate symptoms and progress in treatment and Medication management   [name] is a 53 year old male with long history of schizoaffective disorder admitted for agitated and threatening behavior at the group home in the context of medication noncompliance.  1. Agitation. This has resolved.  2. Mood and psychosis. We restarted lithium for mood stabilization and Prolixin for psychosis. He was given injections of [place] and Prolixin Decanoate to improve compliance. Lithium level is 0.5.  Lithium has been increased to 1500 mg and add low dose depakote. He has a history of high ammonia on Depakote. No changes today as patient is stable.  3. Hypothyroidism:  continue Synthroid. TSH is normal.  4. HIV:  continue Atripla.  5. Hypertension:  continue antihypertensives. VS well controlled  6. Dyslipidemia: continue Zocor. Lipid panel is not elevated.  7. BPH. We will continue Flomax.  8. Social. The patient is not allowed to return to his group home. Currently homeless. Unable to care for self without supervision.  9. Disposition. To be established.   Jimmy Footman 01/06/2015, 8:14 AM

## 2015-01-06 NOTE — Progress Notes (Signed)
Pt's mood and affect has been much better today Pt slept late(up about 1030hrs). Pt took all of his morning meds without coaxing or strongely  encouraging. Pt was not loud or intrusive.

## 2015-01-07 NOTE — Progress Notes (Signed)
[facility] MD Progress Note  01/07/2015 1:09 PM [name]  MRN:  [id number]  Subjective:  [name] has no complaints. He denies symptoms of depression, anxiety, or psychosis. He still appears psychotic talking to himself in the hallway, restless at times. Today he is focused again on his discharge plans. He wrote a 2 page letter explaining that he does not wish to go to [facility] [place] and would like to be discharged to a group home. He realizes that he burned a lot of bridges and placement is not easy. There are no somatic complaints. Sleep and appetite are good. He accepts and tolerates medications well.  Principal Problem: Schizoaffective disorder, bipolar type (HCC) Diagnosis:   Patient Active Problem List   Diagnosis Date Noted  . Tobacco use disorder [F17.200] 12/27/2014  . HIV positive (HCC) [Z21] 12/26/2014  . Hypertension [I10] 12/26/2014  . Hypothyroidism [E03.9] 12/26/2014  . Dyslipidemia [E78.5] 12/26/2014  . Schizoaffective disorder, bipolar type (HCC) [F25.0] 10/25/2014  . Alcohol abuse [F10.10] 10/25/2014   Total Time spent with patient: 20 minutes  Past Psychiatric History: Schizoaffective disorder.   Past Medical History:  Past Medical History  Diagnosis Date  . HIV (human immunodeficiency virus infection) (HCC)   . BPH (benign prostatic hypertrophy)   . Dyslipidemia   . Hypertension   . Hypothyroidism   . Schizoaffective disorder (HCC)    History reviewed. No pertinent past surgical history. Family History: History reviewed. No pertinent family history. Family Psychiatric  History: unknown. Social History:  History  Alcohol Use  . 1.2 - 1.8 oz/week  . 2-3 Cans of beer per week     History  Drug Use No    Social History   Social History  . Marital Status: Single    Spouse Name: N/A  . Number of Children: N/A  . Years of Education: N/A   Social History Main Topics  . Smoking status: Current Every Day Smoker -- 2.00 packs/day   Types: Cigarettes  . Smokeless tobacco: None  . Alcohol Use: 1.2 - 1.8 oz/week    2-3 Cans of beer per week  . Drug Use: No  . Sexual Activity: Not Asked   Other Topics Concern  . None   Social History Narrative   Additional Social History:                         Sleep: Good  Appetite:  Good  Current Medications: Current Facility-Administered Medications  Medication Dose Route Frequency Provider Last Rate Last Dose  . acetaminophen (TYLENOL) tablet 650 mg  650 mg Oral Q6H PRN Audery Amel, MD      . alum & mag hydroxide-simeth (MAALOX/MYLANTA) 200-200-20 MG/5ML suspension 30 mL  30 mL Oral Q4H PRN Audery Amel, MD      . aspirin EC tablet 81 mg  81 mg Oral Daily Audery Amel, MD   81 mg at 01/07/15 0856  . benztropine (COGENTIN) tablet 2 mg  2 mg Oral Daily Shari Prows, MD   2 mg at 01/07/15 0855  . carvedilol (COREG) tablet 3.125 mg  3.125 mg Oral BID WC Audery Amel, MD   3.125 mg at 01/07/15 0853  . cholecalciferol (VITAMIN D) tablet 2,000 Units  2,000 Units Oral Daily Audery Amel, MD   2,000 Units at 01/07/15 [phone]  . divalproex (DEPAKOTE) DR tablet 500 mg  500 mg Oral QHS Shari Prows, MD   500  mg at 01/06/15 2057  . efavirenz-emtricitabine-tenofovir (ATRIPLA) 600-200-300 MG per tablet 1 tablet  1 tablet Oral QHS Audery AmelJohn T Clapacs, MD   1 tablet at 01/06/15 2057  . fluPHENAZine (PROLIXIN) tablet 5 mg  5 mg Oral BID AC Audery AmelJohn T Clapacs, MD   5 mg at 01/07/15 0853  . fluPHENAZine decanoate (PROLIXIN) injection 50 mg  50 mg Intramuscular Q14 Days Shari ProwsJolanta B Terald Jump, MD   50 mg at 01/01/15 0811  . levothyroxine (SYNTHROID, LEVOTHROID) tablet 25 mcg  25 mcg Oral QAC breakfast Audery AmelJohn T Clapacs, MD   25 mcg at 01/07/15 0855  . lithium carbonate (ESKALITH) CR tablet 900 mg  900 mg Oral QHS Shari ProwsJolanta B Isac Lincks, MD   900 mg at 01/06/15 2058  . lithium carbonate (LITHOBID) CR tablet 600 mg  600 mg Oral Q breakfast Jernard Reiber B Sanjeev Main, MD   600 mg at  01/07/15 0852  . magnesium hydroxide (MILK OF MAGNESIA) suspension 30 mL  30 mL Oral Daily PRN Audery AmelJohn T Clapacs, MD      . paliperidone (INVEGA SUSTENNA) injection 234 mg  234 mg Intramuscular Q28 days Shari ProwsJolanta B Elijahjames Fuelling, MD   234 mg at 01/01/15 0943  . paliperidone (INVEGA) 24 hr tablet 6 mg  6 mg Oral QHS Shari ProwsJolanta B Zaniya Mcaulay, MD   6 mg at 01/06/15 2058  . simvastatin (ZOCOR) tablet 20 mg  20 mg Oral q1800 Audery AmelJohn T Clapacs, MD   20 mg at 01/06/15 1716  . tamsulosin (FLOMAX) capsule 0.4 mg  0.4 mg Oral QPC supper Audery AmelJohn T Clapacs, MD   0.4 mg at 01/06/15 1716  . terazosin (HYTRIN) capsule 1 mg  1 mg Oral QHS Audery AmelJohn T Clapacs, MD   1 mg at 01/06/15 2057    Lab Results: No results found for this or any previous visit (from the past 48 hour(s)).  Physical Findings: AIMS:  , ,  ,  ,    CIWA:    COWS:     Musculoskeletal: Strength & Muscle Tone: within normal limits Gait & Station: normal Patient leans: N/A  Psychiatric Specialty Exam: Review of Systems  All other systems reviewed and are negative.   Blood pressure 120/81, pulse 80, temperature 97.8 F (36.6 C), temperature source Oral, resp. rate 20, height 6' (1.829 m), weight 79.379 kg (175 lb), SpO2 100 %.Body mass index is 23.73 kg/(m^2).  General Appearance: Casual  Eye Contact::  Good  Speech:  Pressured  Volume:  Normal  Mood:  Anxious  Affect:  Appropriate  Thought Process:  Goal Directed  Orientation:  Full (Time, Place, and Person)  Thought Content:  WDL  Suicidal Thoughts:  No  Homicidal Thoughts:  No  Memory:  Immediate;   Fair Recent;   Fair Remote;   Fair  Judgement:  Fair  Insight:  Fair  Psychomotor Activity:  Tremor  Concentration:  Fair  Recall:  [employer]Fair  Fund of Knowledge:Fair  Language: Fair  Akathisia:  No  Handed:  Right  AIMS (if indicated):     Assets:  Communication Skills Desire for Improvement Financial Resources/Insurance Physical Health Resilience  ADL's:  Intact  Cognition: WNL  Sleep:   Number of Hours: 6.5   Treatment Plan Summary: Daily contact with patient to assess and evaluate symptoms and progress in treatment and Medication management   [name] is a 54 year old male with long history of schizoaffective disorder admitted for agitated and threatening behavior at the group home in the context of medication noncompliance.  1.  Agitation. This has resolved.  2. Mood and psychosis. We restarted lithium for mood stabilization and Prolixin for psychosis. He was given injections of [place] and Prolixin Decanoate to improve compliance. Lithium level is 0.5. Lithium has been increased to 1500 mg and add low dose depakote. He has a history of high ammonia on Depakote. Will recheck Li, VPA and Ammonia level.  3. Hypothyroidism. Continue Synthroid. TSH is normal.  4. HIV. Continue Atripla.  5. Hypertension. Continue antihypertensives. VS well controlled  6. Dyslipidemia. Continue Zocor. Lipid panel is not elevated.  7. BPH. We will continue Flomax.  8. Social. The patient is not allowed to return to his group home. Currently homeless. Unable to care for self without supervision.  9. Disposition. To be established.  Samyia Motter 01/07/2015, 1:09 PM

## 2015-01-07 NOTE — [facility] Group Notes (Signed)
[facility] Group Notes:  (Nursing/MHT/Case Management/Adjunct)  Date:  01/07/2015  Time:  9:05 PM  Type of Therapy:  Group Therapy  Participation Level:  Active  Participation Quality:  Appropriate  Affect:  Appropriate  Cognitive:  Appropriate  Insight:  Appropriate  Engagement in Group:  Engaged  Modes of Intervention:  Discussion  Summary of Progress/Problems:  [name] 01/07/2015, 9:05 PM

## 2015-01-07 NOTE — Progress Notes (Signed)
Patient still has a labile mood but has not acted out aggressively. His thoughts are rambling and at times he says he wants to be discharged to the streets and other times [facility]. Reports intermittent SI. No plan. Denies sx of psychosis. Will continue to monitor.

## 2015-01-07 NOTE — Plan of Care (Signed)
Problem: Alteration in mood Goal: LTG-Patient reports reduction in suicidal thoughts (Patient reports reduction in suicidal thoughts and is able to verbalize a safety plan for whenever patient is feeling suicidal)  Outcome: Not Met (add Reason) Pt remains anxious and agitated towards staff. No injuries noted. No voiced thoughts of hurting himself. q 15 min checks maintained for safety.

## 2015-01-07 NOTE — [facility] Group Notes (Signed)
[facility] LCSW Aftercare Discharge Planning Group Note   01/07/2015 4:02 PM  Participation Quality:  Did not attend group   Juell Radney T, MSW, LCSWA   

## 2015-01-07 NOTE — [facility] Group Notes (Signed)
[facility] Group Notes:  (Nursing/MHT/Case Management/Adjunct)  Date:  01/07/2015  Time:  2:02 PM  Type of Therapy:  Psychoeducational Skills  Participation Level:  Active  Participation Quality:  Appropriate  Affect:  Appropriate  Cognitive:  Appropriate  Insight:  Appropriate  Engagement in Group:  Engaged  Modes of Intervention:  Discussion, Education and Support  Summary of Progress/Problems:  [name] 01/07/2015, 2:02 PM

## 2015-01-07 NOTE — Progress Notes (Signed)
Recreation Therapy Notes  Date: 10.17.16 Time: 3:00 pm Location: Craft Room  Group Topic: Self-expression  Goal Area(s) Addresses:  Patient will be able to identify a color that represents each emotion. Patient will verbalize benefit of using art as a means of self-expression. Patient will verbalize one positive emotion experienced while participating in activity.  Behavioral Response: Attentive, Interactive, Disruptive  Intervention: The Colors Within Me  Activity: Patients were given blank face worksheets and instructed to pick a color for each emotion they were experiencing and show how much of the emotion they were experiencing on the face.  Education: LRT educated patients on different forms of self-expression.  Education Outcome: In group clarification offered   Clinical Observations/Feedback: Patient completed worksheet. Patient contributed to group discussion by stating what he was feeling. LRT had to redirect patient multiple times. Patient complied.  Jacquelynn CreeGreene,Delbra Zellars M, LRT/CTRS 01/07/2015 4:26 PM

## 2015-01-07 NOTE — Progress Notes (Signed)
Patient more cooperative today although he continues to be loud and labile. He was med compliant, noting he was happy if he can take medications only twice a day. He notes he is happy he will be "on the street soon". No needs or distress noted. Denies SI, HI, and AVH.

## 2015-01-08 LAB — VALPROIC ACID LEVEL: VALPROIC ACID LVL: 12 ug/mL — AB (ref 50.0–100.0)

## 2015-01-08 LAB — LITHIUM LEVEL: Lithium Lvl: 0.35 mmol/L — ABNORMAL LOW (ref 0.60–1.20)

## 2015-01-08 LAB — AMMONIA: Ammonia: 27 umol/L (ref 9–35)

## 2015-01-08 NOTE — [facility] Group Notes (Signed)
[facility] LCSW Group Therapy  01/08/2015 2:55 PM  Type of Therapy:  Group Therapy  Participation Level:  Did Not Attend   Krystel Fletchall T, MSW, LCSWA 01/08/2015, 2:55 PM  

## 2015-01-08 NOTE — Progress Notes (Signed)
Disruptive, loud, communicating threat, angry and hostile because he was not discharge, "I am ready to go to jail, I will call 911 myself, give me bag to pack my things ...." Medications given earlier to stabilize mood, patient agreed to stay in his room until he can constructively interact with others.

## 2015-01-08 NOTE — Plan of Care (Signed)
Problem: Alteration in mood Goal: LTG-Patient reports reduction in suicidal thoughts (Patient reports reduction in suicidal thoughts and is able to verbalize a safety plan for whenever patient is feeling suicidal)  Outcome: Progressing Patient denies suicidal thought, no plans to harm self.

## 2015-01-08 NOTE — Tx Team (Signed)
Interdisciplinary Treatment Plan Update (Adult)  Date:  01/08/2015 Time Reviewed:  4:37 PM  Progress in Treatment: Attending groups: Yes. Participating in groups:  Yes. Taking medication as prescribed:  No. Tolerating medication:  Yes. Family/Significant othe contact made:  No, will contact:  ACT TEAM, MAIN SUPPORT Patient understands diagnosis:  YES Discussing patient identified problems/goals with staff:  Yes. Medical problems stabilized or resolved:  Yes. Denies suicidal/homicidal ideation: Yes. Issues/concerns per patient self-inventory:  Yes. Other:  New problem(s) identified: No, Describe:     Discharge Plan or Barriers: Pt difficult to place due to high utilization of group homes, [place] the [place].  PT continues to express he will go to group home but is not likely to stay, [facility]  Reason for Continuation of Hospitalization: Mania Medication stabilization Other; describe labile, argumentative  Comments:Busler is loud, agitated, hard to redirect and intrusive. He demands to be discharged immediately. He refuses vital signs and threatens to refuse his medications. He feels that we are not making adequate effort to find him a place. He is not allowed to return to his group home due to unwanted behaviors.  Estimated length of stay: up to 7 days  New goal(s):  Review of initial/current patient goals per problem list:   See plan of care  Attendees: Patient:  Daniel MoundJaime Sramek 10/18/20164:37 PM  Family:   10/18/20164:37 PM  Physician:  Kristine LineaJolanta Pucilowska 10/18/20164:37 PM  Nursing:    10/18/20164:37 PM  Case Manager:   10/18/20164:37 PM  Counselor:  Jake SharkSara Trapper Meech, LCSW 10/18/20164:37 PM  Other:  Hershal CoriaBeth Greene, LRT 10/18/20164:37 PM  Other:  Beryl MeagerJason [name], LCSWA 10/18/20164:37 PM  Other:   10/18/20164:37 PM  Other:  10/18/20164:37 PM  Other:  10/18/20164:37 PM  Other:  10/18/20164:37 PM  Other:  10/18/20164:37 PM  Other:  10/18/20164:37 PM   Other:  10/18/20164:37 PM  Other:   10/18/20164:37 PM   Scribe for Treatment Team:   Glennon MacLaws, Tykwon Fera P, 01/08/2015, 4:37 PM, MSW, LCSW

## 2015-01-08 NOTE — Psychosocial Assessment (Signed)
Patient is alert and oriented, ambulating around nursing station, will sit in floor at times, attempts to engaged staff in conversation, but will start to yell and scream ,, curse, He is agitated, states " I want to leave this place" patient did take his morning medications late , states " my social worker told me take medications so I could get out of here" Patient refuses to do self inventory and told nurse " Leave me alone with that" I am never doing that" Nurse agreed not to insist on taking self inventory. Patient denies pain or physical discomfort.

## 2015-01-08 NOTE — Progress Notes (Signed)
Recreation Therapy Notes  Date: 10.18.16 Time: 3:00 pm Location: Craft Room  Group Topic: Goal Setting  Goal Area(s) Addresses:  Patient will write one goal. Patient will verbalize benefit of setting goals.  Behavioral Response: Attentive, Left early  Intervention: Step By Step  Activity: Patients were given a worksheet with a foot on it. Patients were instructed to write a goal on the inside of the foot and write positive words on the outside of the foot.  Education: LRT educated patients on ways the can keep themselves focused on their goals.  Education Outcome: Patient left before LRT educated group.  Clinical Observations/Feedback: Patient completed activity. Patient left group at approximately 3:23 pm. Patient did not return to group.  Jacquelynn CreeGreene,Lita Flynn M, LRT/CTRS 01/08/2015 4:11 PM

## 2015-01-08 NOTE — [facility] Group Notes (Signed)
[facility] Group Notes:  (Nursing/MHT/Case Management/Adjunct)  Date:  01/08/2015  Time:  1:52 PM  Type of Therapy:  Psychoeducational Skills  Participation Level:  Did Not Attend    Mickey Farberamela M Moses Ellison 01/08/2015, 1:52 PM

## 2015-01-08 NOTE — Progress Notes (Signed)
Patient is irritable and agitated. Refused all po bedtime medications. [facility] says he wont take any medications until he's discharged. Denies SI/HI/AV/H. No behavior problems noted. No c/o pain/discomfort noted.

## 2015-01-08 NOTE — Progress Notes (Signed)
Patient is restless, ambulating around nursing station, yells out at times, Patient will become fixated on going home, states " I'm going to walk the streets, get me out of here", nurses have encouraged him to talk lower and He can be redirected at times. Patient denies pain or suicidal ideation.

## 2015-01-08 NOTE — Progress Notes (Signed)
[facility] MD Progress Note  01/08/2015 12:58 PM [name]  MRN:  [id number]  Subjective: [name] is loud, agitated, hard to redirect and intrusive. He demands to be discharged immediately. He refuses vital signs and threatens to refuse his medications. He feels that we are not making adequate effort to find him a place. He is not allowed to return to his group home due to unwanted behaviors.  Principal Problem: Schizoaffective disorder, bipolar type (HCC) Diagnosis:   Patient Active Problem List   Diagnosis Date Noted  . Tobacco use disorder [F17.200] 12/27/2014  . HIV positive (HCC) [Z21] 12/26/2014  . Hypertension [I10] 12/26/2014  . Hypothyroidism [E03.9] 12/26/2014  . Dyslipidemia [E78.5] 12/26/2014  . Schizoaffective disorder, bipolar type (HCC) [F25.0] 10/25/2014  . Alcohol abuse [F10.10] 10/25/2014   Total Time spent with patient: 20 minutes  Past Psychiatric History: Long history of schizoaffective disorder.  Past Medical History:  Past Medical History  Diagnosis Date  . HIV (human immunodeficiency virus infection) (HCC)   . BPH (benign prostatic hypertrophy)   . Dyslipidemia   . Hypertension   . Hypothyroidism   . Schizoaffective disorder (HCC)    History reviewed. No pertinent past surgical history. Family History: History reviewed. No pertinent family history. Family Psychiatric  History: None reported. Social History:  History  Alcohol Use  . 1.2 - 1.8 oz/week  . 2-3 Cans of beer per week     History  Drug Use No    Social History   Social History  . Marital Status: Single    Spouse Name: N/A  . Number of Children: N/A  . Years of Education: N/A   Social History Main Topics  . Smoking status: Current Every Day Smoker -- 2.00 packs/day    Types: Cigarettes  . Smokeless tobacco: None  . Alcohol Use: 1.2 - 1.8 oz/week    2-3 Cans of beer per week  . Drug Use: No  . Sexual Activity: Not Asked   Other Topics Concern  . None   Social History  Narrative   Additional Social History:                         Sleep: Fair  Appetite:  Fair  Current Medications: Current Facility-Administered Medications  Medication Dose Route Frequency Provider Last Rate Last Dose  . acetaminophen (TYLENOL) tablet 650 mg  650 mg Oral Q6H PRN Audery Amel, MD      . alum & mag hydroxide-simeth (MAALOX/MYLANTA) 200-200-20 MG/5ML suspension 30 mL  30 mL Oral Q4H PRN Audery Amel, MD      . aspirin EC tablet 81 mg  81 mg Oral Daily Audery Amel, MD   81 mg at 01/07/15 0856  . benztropine (COGENTIN) tablet 2 mg  2 mg Oral Daily Shari Prows, MD   2 mg at 01/07/15 0855  . carvedilol (COREG) tablet 3.125 mg  3.125 mg Oral BID WC Audery Amel, MD   3.125 mg at 01/07/15 1717  . cholecalciferol (VITAMIN D) tablet 2,000 Units  2,000 Units Oral Daily Audery Amel, MD   2,000 Units at 01/07/15 [phone]  . divalproex (DEPAKOTE) DR tablet 500 mg  500 mg Oral QHS Shari Prows, MD   500 mg at 01/06/15 2057  . efavirenz-emtricitabine-tenofovir (ATRIPLA) 600-200-300 MG per tablet 1 tablet  1 tablet Oral QHS Audery Amel, MD   1 tablet at 01/06/15 2057  . fluPHENAZine (PROLIXIN) tablet 5 mg  5 mg Oral BID AC Audery AmelJohn T Clapacs, MD   5 mg at 01/07/15 1717  . fluPHENAZine decanoate (PROLIXIN) injection 50 mg  50 mg Intramuscular Q14 Days Shari ProwsJolanta B Camerin Jimenez, MD   50 mg at 01/01/15 0811  . levothyroxine (SYNTHROID, LEVOTHROID) tablet 25 mcg  25 mcg Oral QAC breakfast Audery AmelJohn T Clapacs, MD   25 mcg at 01/07/15 0855  . lithium carbonate (ESKALITH) CR tablet 900 mg  900 mg Oral QHS Shari ProwsJolanta B Don Tiu, MD   900 mg at 01/06/15 2058  . lithium carbonate (LITHOBID) CR tablet 600 mg  600 mg Oral Q breakfast Timmie Dugue B Meldon Hanzlik, MD   600 mg at 01/07/15 0852  . magnesium hydroxide (MILK OF MAGNESIA) suspension 30 mL  30 mL Oral Daily PRN Audery AmelJohn T Clapacs, MD      . paliperidone (INVEGA SUSTENNA) injection 234 mg  234 mg Intramuscular Q28 days Shari ProwsJolanta B  Noralyn Karim, MD   234 mg at 01/01/15 0943  . paliperidone (INVEGA) 24 hr tablet 6 mg  6 mg Oral QHS Shari ProwsJolanta B October Peery, MD   6 mg at 01/06/15 2058  . simvastatin (ZOCOR) tablet 20 mg  20 mg Oral q1800 Audery AmelJohn T Clapacs, MD   20 mg at 01/07/15 1716  . tamsulosin (FLOMAX) capsule 0.4 mg  0.4 mg Oral QPC supper Audery AmelJohn T Clapacs, MD   0.4 mg at 01/07/15 1717  . terazosin (HYTRIN) capsule 1 mg  1 mg Oral QHS Audery AmelJohn T Clapacs, MD   1 mg at 01/06/15 2057    Lab Results:  Results for orders placed or performed during the hospital encounter of 12/26/14 (from the past 48 hour(s))  Lithium level     Status: Abnormal   Collection Time: 01/08/15  7:46 AM  Result Value Ref Range   Lithium Lvl 0.35 (L) 0.60 - 1.20 mmol/L  Ammonia     Status: None   Collection Time: 01/08/15  7:46 AM  Result Value Ref Range   Ammonia 27 9 - 35 umol/L  Valproic acid level     Status: Abnormal   Collection Time: 01/08/15  7:46 AM  Result Value Ref Range   Valproic Acid Lvl 12 (L) 50.0 - 100.0 ug/mL    Physical Findings: AIMS:  , ,  ,  ,    CIWA:    COWS:     Musculoskeletal: Strength & Muscle Tone: within normal limits Gait & Station: normal Patient leans: N/A  Psychiatric Specialty Exam: Review of Systems  All other systems reviewed and are negative.   Blood pressure 120/81, pulse 80, temperature 97.8 F (36.6 C), temperature source Oral, resp. rate 20, height 6' (1.829 m), weight 79.379 kg (175 lb), SpO2 100 %.Body mass index is 23.73 kg/(m^2).  General Appearance: Casual  Eye Contact::  Fair  Speech:  Pressured  Volume:  Increased  Mood:  Angry, Dysphoric and Irritable  Affect:  Labile  Thought Process:  Goal Directed  Orientation:  Full (Time, Place, and Person)  Thought Content:  Delusions and Paranoid Ideation  Suicidal Thoughts:  No  Homicidal Thoughts:  No  Memory:  Immediate;   Fair Recent;   Fair Remote;   Fair  Judgement:  Poor  Insight:  Lacking  Psychomotor Activity:  Increased   Concentration:  Fair  Recall:  [employer]Fair  Fund of Knowledge:Fair  Language: Fair  Akathisia:  No  Handed:  Right  AIMS (if indicated):     Assets:  Communication Skills Desire for Improvement Financial Resources/Insurance Physical  Health Resilience  ADL's:  Intact  Cognition: WNL  Sleep:  Number of Hours: 6.15   Treatment Plan Summary: Daily contact with patient to assess and evaluate symptoms and progress in treatment and Medication management   [name] is a 54 year old male with long history of schizoaffective disorder admitted for agitated and threatening behavior at the group home in the context of medication noncompliance.  1. Agitation. This has resolved.  2. Mood and psychosis. We restarted lithium for mood stabilization and Prolixin for psychosis. He was given injections of [place] and Prolixin Decanoate to improve compliance. Lithium level is 0.5. Lithium has been increased to 1500 mg and add low dose depakote. He has a history of high ammonia on Depakote. Will recheck Li, VPA and Ammonia level.  3. Hypothyroidism. Continue Synthroid. TSH is normal.  4. HIV. Continue Atripla.  5. Hypertension. Continue antihypertensives. VS well controlled  6. Dyslipidemia. Continue Zocor. Lipid panel is not elevated.  7. BPH. We will continue Flomax.  8. Social. The patient is not allowed to return to his group home. Currently homeless. Unable to care for self without supervision.  9. Disposition. To be established.   Sundy Houchins 01/08/2015, 12:58 PM

## 2015-01-09 MED ORDER — TEMAZEPAM 15 MG PO CAPS
30.0000 mg | ORAL_CAPSULE | Freq: Every evening | ORAL | Status: DC | PRN
  Administered 2015-01-10 – 2015-01-12 (×2): 30 mg via ORAL
  Filled 2015-01-09 (×2): qty 2

## 2015-01-09 NOTE — Progress Notes (Signed)
More blank papers given to patient as requested to write concerns, questions and feelings.

## 2015-01-09 NOTE — [facility] Group Notes (Signed)
[facility] Group Notes:  (Nursing/MHT/Case Management/Adjunct)  Date:  01/09/2015  Time:  2:32 PM  Type of Therapy:  Psychoeducational Skills  Participation Level:  Did Not Attend   Lynelle SmokeCara Travis Hoag Orthopedic InstituteMadoni 01/09/2015, 2:32 PM

## 2015-01-09 NOTE — Plan of Care (Signed)
Problem: Ineffective individual coping Goal: STG: Patient will remain free from self harm Outcome: Progressing Medications administered as ordered by the physician, medications Therapeutic Effects, SEs and Adverse effects discussed, questions encouraged; no PRN given, 15 minute checks maintained for safety, room closer to nurses' station, clinical and moral support provided, patient encouraged to continue to express feelings and demonstrate safe care. Patient remain free from harm, will continue to monitor.

## 2015-01-09 NOTE — Progress Notes (Signed)
D: Patient stated slept good last night .Stated appetite is good and energy level  Is normal.0-10 high) Denies suicidal  homicidal ideations  Patient  Continue to verbalize  Wanting to go home . Periods of loud talking  And cursing at staff. Continue to write essays of wanting to go home   A: Encourage patient participation with unit programming . Instruction  Given on  Medication , verbalize understanding. R: Voice no other concerns. Staff continue to monitor

## 2015-01-09 NOTE — Progress Notes (Signed)
[facility] MD Progress Note  01/09/2015 2:15 PM [name]  MRN:  [id number]  Subjective: [name] remains loud, agitated, intrusive, disorganized, heard to redirect. He is focused on his discharge again today. He gets phone number is 2 multiple group homes and assisted living facilities from our old phonebook and has been harassing them with his calls. This morning she insisted that he goes to jail immediately. He agreed to take his medications after some refusals. He believes that he feels slightly better on the weekend. He tries to participate in programming. There are no somatic complaints. There are no side effects of medications reported.  Principal Problem: Schizoaffective disorder, bipolar type (HCC) Diagnosis:   Patient Active Problem List   Diagnosis Date Noted  . Tobacco use disorder [F17.200] 12/27/2014  . HIV positive (HCC) [Z21] 12/26/2014  . Hypertension [I10] 12/26/2014  . Hypothyroidism [E03.9] 12/26/2014  . Dyslipidemia [E78.5] 12/26/2014  . Schizoaffective disorder, bipolar type (HCC) [F25.0] 10/25/2014  . Alcohol abuse [F10.10] 10/25/2014   Total Time spent with patient: 20 minutes  Past Psychiatric History: long history of schizophrenia.  Past Medical History:  Past Medical History  Diagnosis Date  . HIV (human immunodeficiency virus infection) (HCC)   . BPH (benign prostatic hypertrophy)   . Dyslipidemia   . Hypertension   . Hypothyroidism   . Schizoaffective disorder (HCC)    History reviewed. No pertinent past surgical history. Family History: History reviewed. No pertinent family history. Family Psychiatric  History: unknown. Social History:  History  Alcohol Use  . 1.2 - 1.8 oz/week  . 2-3 Cans of beer per week     History  Drug Use No    Social History   Social History  . Marital Status: Single    Spouse Name: N/A  . Number of Children: N/A  . Years of Education: N/A   Social History Main Topics  . Smoking status: Current Every Day Smoker  -- 2.00 packs/day    Types: Cigarettes  . Smokeless tobacco: None  . Alcohol Use: 1.2 - 1.8 oz/week    2-3 Cans of beer per week  . Drug Use: No  . Sexual Activity: Not Asked   Other Topics Concern  . None   Social History Narrative   Additional Social History:                         Sleep: Poor  Appetite:  Fair  Current Medications: Current Facility-Administered Medications  Medication Dose Route Frequency Provider Last Rate Last Dose  . acetaminophen (TYLENOL) tablet 650 mg  650 mg Oral Q6H PRN Audery Amel, MD      . alum & mag hydroxide-simeth (MAALOX/MYLANTA) 200-200-20 MG/5ML suspension 30 mL  30 mL Oral Q4H PRN Audery Amel, MD      . aspirin EC tablet 81 mg  81 mg Oral Daily Audery Amel, MD   81 mg at 01/09/15 0932  . benztropine (COGENTIN) tablet 2 mg  2 mg Oral Daily Shari Prows, MD   2 mg at 01/09/15 0932  . carvedilol (COREG) tablet 3.125 mg  3.125 mg Oral BID WC Audery Amel, MD   3.125 mg at 01/08/15 1315  . cholecalciferol (VITAMIN D) tablet 2,000 Units  2,000 Units Oral Daily Audery Amel, MD   2,000 Units at 01/09/15 0932  . divalproex (DEPAKOTE) DR tablet 500 mg  500 mg Oral QHS Laquonda Welby B Jerico Grisso, MD   500 mg at  01/08/15 2001  . efavirenz-emtricitabine-tenofovir (ATRIPLA) 600-200-300 MG per tablet 1 tablet  1 tablet Oral QHS Audery AmelJohn T Clapacs, MD   1 tablet at 01/08/15 2003  . fluPHENAZine (PROLIXIN) tablet 5 mg  5 mg Oral BID AC Audery AmelJohn T Clapacs, MD   5 mg at 01/09/15 0754  . fluPHENAZine decanoate (PROLIXIN) injection 50 mg  50 mg Intramuscular Q14 Days Shari ProwsJolanta B Shyheem Whitham, MD   50 mg at 01/01/15 0811  . levothyroxine (SYNTHROID, LEVOTHROID) tablet 25 mcg  25 mcg Oral QAC breakfast Audery AmelJohn T Clapacs, MD   25 mcg at 01/09/15 0727  . lithium carbonate (ESKALITH) CR tablet 900 mg  900 mg Oral QHS Shari ProwsJolanta B Dazani Norby, MD   900 mg at 01/08/15 2005  . lithium carbonate (LITHOBID) CR tablet 600 mg  600 mg Oral Q breakfast Marisa Hage B  Julyan Gales, MD   600 mg at 01/09/15 0757  . magnesium hydroxide (MILK OF MAGNESIA) suspension 30 mL  30 mL Oral Daily PRN Audery AmelJohn T Clapacs, MD      . paliperidone (INVEGA SUSTENNA) injection 234 mg  234 mg Intramuscular Q28 days Shari ProwsJolanta B Lileigh Fahringer, MD   234 mg at 01/01/15 0943  . paliperidone (INVEGA) 24 hr tablet 6 mg  6 mg Oral QHS Shari ProwsJolanta B Norman Piacentini, MD   6 mg at 01/08/15 2002  . simvastatin (ZOCOR) tablet 20 mg  20 mg Oral q1800 Audery AmelJohn T Clapacs, MD   20 mg at 01/08/15 1847  . tamsulosin (FLOMAX) capsule 0.4 mg  0.4 mg Oral QPC supper Audery AmelJohn T Clapacs, MD   0.4 mg at 01/08/15 1848  . terazosin (HYTRIN) capsule 1 mg  1 mg Oral QHS Audery AmelJohn T Clapacs, MD   1 mg at 01/08/15 2003    Lab Results:  Results for orders placed or performed during the hospital encounter of 12/26/14 (from the past 48 hour(s))  Lithium level     Status: Abnormal   Collection Time: 01/08/15  7:46 AM  Result Value Ref Range   Lithium Lvl 0.35 (L) 0.60 - 1.20 mmol/L  Ammonia     Status: None   Collection Time: 01/08/15  7:46 AM  Result Value Ref Range   Ammonia 27 9 - 35 umol/L  Valproic acid level     Status: Abnormal   Collection Time: 01/08/15  7:46 AM  Result Value Ref Range   Valproic Acid Lvl 12 (L) 50.0 - 100.0 ug/mL    Physical Findings: AIMS:  , ,  ,  ,    CIWA:    COWS:     Musculoskeletal: Strength & Muscle Tone: within normal limits Gait & Station: normal Patient leans: N/A  Psychiatric Specialty Exam: Review of Systems  Gastrointestinal: Positive for constipation.  All other systems reviewed and are negative.   Blood pressure 88/48, pulse 74, temperature 98.4 F (36.9 C), temperature source Oral, resp. rate 20, height 6' (1.829 m), weight 79.379 kg (175 lb), SpO2 100 %.Body mass index is 23.73 kg/(m^2).  General Appearance: Casual  Eye Contact::  Good  Speech:  Pressured  Volume:  Increased  Mood:  Euphoric  Affect:  Labile  Thought Process:  Disorganized  Orientation:  Full (Time, Place,  and Person)  Thought Content:  WDL  Suicidal Thoughts:  No  Homicidal Thoughts:  No  Memory:  Immediate;   Fair Recent;   Fair Remote;   Fair  Judgement:  Impaired  Insight:  Fair and Shallow  Psychomotor Activity:  Normal  Concentration:  Fair  Recall:  Jennelle Human of Knowledge:Fair  Language: Fair  Akathisia:  No  Handed:  Right  AIMS (if indicated):     Assets:  Communication Skills Desire for Improvement Financial Resources/Insurance Housing Leisure Time Physical Health Resilience Social Support  ADL's:  Intact  Cognition: WNL  Sleep:  Number of Hours: 1.5   Treatment Plan Summary: Daily contact with patient to assess and evaluate symptoms and progress in treatment and Medication management   Mr. Kabel is a 54 year old male with long history of schizoaffective disorder admitted for agitated and threatening behavior at the group home in the context of medication noncompliance.  1. Agitation. This has resolved.  2. Mood and psychosis. We restarted lithium for mood stabilization and Prolixin for psychosis. He was given injections of [place] and Prolixin Decanoate to improve compliance. Lithium level is 0.5. Lithium has been increased to 1500 mg and add low dose depakote added. He has a history of high ammonia on Depakote. Will recheck Li, VPA and Ammonia level.  3. Hypothyroidism. Continue Synthroid. TSH is normal.  4. HIV. Continue Atripla.  5. Hypertension. Continue antihypertensives. VS well controlled  6. Dyslipidemia. Continue Zocor. Lipid panel is not elevated.  7. BPH. We will continue Flomax.  8. Social. The patient is not allowed to return to his group home. Currently homeless. Unable to care for self without supervision.  9. Insomnia. He has not been sleeping we will add Restoril.  10. Disposition. To be established.  Azie Mcconahy 01/09/2015, 2:15 PM

## 2015-01-09 NOTE — [facility] Group Notes (Signed)
[facility]   01/09/2015 11:27 AM  Participation Quality:  Active  Mood/Affect:  Appropriate  Thoughts of Suicide:  NA Will you contract for safety?   NA  Current AVH:  NA  Plan for Discharge/Comments:  Patient actively and appropriately participated in the session.   Transportation Means: Patient was informed about different transportation resources.  Supports: Patient was informed about community resources.   Jenel LucksJasmine Lewis, Clinical Social Work Intern 01/09/15  Beryl MeagerJason Keion Neels, MSW, Theresia MajorsLCSWA 01/10/15

## 2015-01-09 NOTE — Plan of Care (Signed)
Problem: Alteration in mood Goal: STG-Patient is able to discuss feelings and issues (Patient is able to discuss feelings and issues leading to depression)  Outcome: Progressing Patient 's mood up and down periods  Of yelling at staff .

## 2015-01-09 NOTE — Progress Notes (Signed)
Recreation Therapy Notes  Date: 10.19.16 Time: 3:00 pm Location: Craft Room  Group Topic: Self-esteem  Goal Area(s) Addresses:  Patient will identify positive attributes about self. Patient will identify at least one healthy coping skill.  Behavioral Response: Attentive, Left early  Intervention: All About Me  Activity: Patients were instructed to make an All About Me pamphlet listing their life's motto, positive traits, healthy coping skills, and their healthy support system.  Education: LRT educated patient on ways they can increase their self-esteem   Education Outcome: Patient left before LRT educated group.  Clinical Observations/Feedback: Patient completed activity by listing his life's motto, positive traits, healthy coping skills, and his healthy support system. Patient left group at approximately 3:35 pm and did not return to group.  Jacquelynn CreeGreene,[name], LRT/CTRS  01/09/2015 4:35 PM

## 2015-01-10 MED ORDER — LITHIUM CARBONATE ER 450 MG PO TBCR
900.0000 mg | EXTENDED_RELEASE_TABLET | Freq: Once | ORAL | Status: DC
  Filled 2015-01-10: qty 2

## 2015-01-10 MED ORDER — ASPIRIN EC 81 MG PO TBEC
81.0000 mg | DELAYED_RELEASE_TABLET | Freq: Every day | ORAL | Status: DC
  Administered 2015-01-11 – 2015-01-31 (×22): 81 mg via ORAL
  Filled 2015-01-10 (×21): qty 1

## 2015-01-10 MED ORDER — ACETAMINOPHEN 500 MG PO TABS
500.0000 mg | ORAL_TABLET | Freq: Once | ORAL | Status: AC
  Administered 2015-01-10: 500 mg via ORAL
  Filled 2015-01-10 (×2): qty 1

## 2015-01-10 MED ORDER — FLUPHENAZINE HCL 5 MG PO TABS
5.0000 mg | ORAL_TABLET | Freq: Every day | ORAL | Status: DC
  Administered 2015-01-13 – 2015-01-14 (×2): 5 mg via ORAL
  Filled 2015-01-10 (×3): qty 1

## 2015-01-10 MED ORDER — VITAMIN D 1000 UNITS PO TABS
2000.0000 [IU] | ORAL_TABLET | Freq: Every day | ORAL | Status: DC
  Administered 2015-01-11 – 2015-01-31 (×21): 2000 [IU] via ORAL
  Filled 2015-01-10 (×21): qty 2

## 2015-01-10 MED ORDER — PALIPERIDONE ER 3 MG PO TB24
3.0000 mg | ORAL_TABLET | Freq: Every day | ORAL | Status: DC
  Administered 2015-01-10 – 2015-01-14 (×3): 3 mg via ORAL
  Filled 2015-01-10 (×3): qty 1

## 2015-01-10 MED ORDER — BENZTROPINE MESYLATE 1 MG PO TABS
2.0000 mg | ORAL_TABLET | Freq: Every day | ORAL | Status: DC
  Administered 2015-01-11 – 2015-01-31 (×21): 2 mg via ORAL
  Filled 2015-01-10 (×22): qty 2

## 2015-01-10 MED ORDER — LITHIUM CITRATE 300 MG/5 ML PO SYRP
600.0000 mg | Freq: Three times a day (TID) | ORAL | Status: DC
  Administered 2015-01-11 – 2015-01-13 (×2): 600 mg via ORAL
  Filled 2015-01-10 (×12): qty 10

## 2015-01-10 MED ORDER — MAGNESIUM CITRATE PO SOLN
1.0000 | Freq: Once | ORAL | Status: DC
  Filled 2015-01-10: qty 296

## 2015-01-10 NOTE — Plan of Care (Signed)
Problem: Ineffective individual coping Goal: STG:Pt. will utilize relaxation techniques to reduce stress STG: Patient will utilize relaxation techniques to reduce stress levels  Outcome: Not Progressing Unable to have any insight into technique

## 2015-01-10 NOTE — [facility] Group Notes (Signed)
[facility] LCSW Group Therapy  01/10/2015 1:13 PM  Type of Therapy:  Group Therapy  Participation Level:  Did Not Attend  Modes of Intervention:  Discussion, Education, Socialization and Support  Summary of Progress/Problems: Balance in life: Patients will discuss the concept of balance and how it looks and feels to be unbalanced. Pt will identify areas in their life that is unbalanced and ways to become more balanced. Pt attended group very briefly before returning to his room.   Romain Erion L Naesha Buckalew MSW, LCSWA  01/10/2015, 1:13 PM

## 2015-01-10 NOTE — Progress Notes (Signed)
Screaming, incoherent, belligerent, disruptive, c/o 10/10 scrotal and hypogastric pain, assessment of groin area indicated unusually swelling right scrotum and hypogastric swelling, Dr Garnetta BuddyFaheem paged, Hospitalist Consult ordered; awaiting Hospitalist.

## 2015-01-10 NOTE — Tx Team (Signed)
Interdisciplinary Treatment Plan Update (Adult)  Date:  01/10/2015 Time Reviewed:  2:05 PM  Progress in Treatment: Attending groups: Yes. Participating in groups:  Yes. Taking medication as prescribed:  Yes. Tolerating medication:  Yes. Family/Significant othe contact made:  Yes, individual(s) contacted:  ACT team Patient understands diagnosis:  No. and As evidenced by:  Limited insight  Discussing patient identified problems/goals with staff:  Yes. Medical problems stabilized or resolved:  Yes. Denies suicidal/homicidal ideation: Yes. Issues/concerns per patient self-inventory:  Yes. Other:  New problem(s) identified: No, Describe:  NA  Discharge Plan or Barriers: Pt plans to d/c to a group home. However, pt states he will commit a crime once discharged so he can go to jail. PASRR evaluation has been completed twice due to his acuity. The authorization number has not been release yet. Pt is still on [place] waitlist.    Reason for Continuation of Hospitalization: Aggression Mania Medication stabilization  Comments: [name] remains loud, agitated, intrusive, disorganized, heard to redirect. He is focused on his discharge again today. He gets phone number is 2 multiple group homes and assisted living facilities from our old phonebook and has been harassing them with his calls. This morning she insisted that he goes to jail immediately. He agreed to take his medications after some refusals. He believes that he feels slightly better on the weekend. He tries to participate in programming. There are no somatic complaints. There are no side effects of medications reported  Estimated length of stay: 7 days   New goal(s): NA  Review of initial/current patient goals per problem list:   1.  Goal(s): Patient will participate in aftercare plan * Met:  * Target date: at discharge * As evidenced by: Patient will participate within aftercare plan AEB aftercare provider and housing plan at  discharge being identified.   2.  Goal (s): Patient will exhibit decreased depressive symptoms and suicidal ideations. * Met:  *  Target date: at discharge * As evidenced by: Patient will utilize self rating of depression at 3 or below and demonstrate decreased signs of depression or be deemed stable for discharge by MD.   3.  Goal(s): Patient will demonstrate decreased signs and symptoms of anxiety. * Met:  * Target date: at discharge * As evidenced by: Patient will utilize self rating of anxiety at 3 or below and demonstrated decreased signs of anxiety, or be deemed stable for discharge by MD  4.  Goal (s): Patient will demonstrate decreased symptoms of psychosis. * Met: No  *  Target date: at discharge * As evidenced by: Patient will not endorse signs of psychosis or be deemed stable for discharge by MD.   Attendees: Patient:  [name] 10/20/20162:05 PM  Family:   10/20/20162:05 PM  Physician: Dr. Bary Leriche   10/20/20162:05 PM  Nursing:   Meredith Mody, RN 10/20/20162:05 PM  Case Manager:   10/20/20162:05 PM  Counselor:   10/20/20162:05 PM  Other:  [place], [place]  10/20/20162:05 PM  Other:  [place]itt Amber, [place]  10/20/20162:05 PM  Other:   10/20/20162:05 PM  Other:  10/20/20162:05 PM  Other:  10/20/20162:05 PM  Other:  10/20/20162:05 PM  Other:  10/20/20162:05 PM  Other:  10/20/20162:05 PM  Other:  10/20/20162:05 PM  Other:   10/20/20162:05 PM   Scribe for Treatment Team:   Wray Kearns MSW, [place] , 01/10/2015, 2:05 PM

## 2015-01-10 NOTE — Progress Notes (Signed)
Tylenol 650 mg PO and Restoril 30 mg PO PRN given, will monitor and provide feedback.

## 2015-01-10 NOTE — Progress Notes (Signed)
Recreation Therapy Notes  Date: 10.20.16 Time: 3:00 pm Location: Craft Room  Group Topic: Leisure Education  Goal Area(s) Addresses:  Patient will write at least one leisure activity. Patient will write a leisure goal.  Behavioral Response: Attentive, Disruptive, Left early  Intervention: Leisure Bucket List  Activity: Patients were given a leisure bucket list worksheet and instructed to list different leisure activity they had not tried, but wanted to.  Education:LRT educated patients on what is needed to participate in leisure.  Education Outcome: Patient left before LRT educated group.  Clinical Observations/Feedback: Patient completed activity. Patient left group talking loudly at approximately 3:32 pm. Patient did not return to group.  Jacquelynn CreeGreene,Kelisha Dall M, LRT/CTRS 01/10/2015 4:25 PM

## 2015-01-10 NOTE — Progress Notes (Signed)
[facility] MD Progress Note  01/10/2015 12:32 PM [name]  MRN:  [id number]  Subjective:  [name] is loud and agitated today. He met with the PASSR assessor today. He was unable to keep his cool. He threatened to hurt people if released from the hospital so there were questions about the stability of his mental illness. He will most likely not be granted [name] which he admits our disposition options. He is on a wait list for [facility]. He has severe bipolar illness his mood has been labile. The patient has had a difficult time controlling his behaviors but he's never been threatening or aggressive towards staff or peers while in the hospital. Today she complains of abdominal pain due to constipation. He was given laxatives and had 1 bowel movement. We will additional medications. The patient has a history of hernia but has not been complaining of an inguinal pain or pain in his scrotum. Today he complains of being mistreated by our nurses as they wake him up at 6:00 in the morning to give him Synthroid. He does not believe he needs it. He does accept medication as prescribed but complains that there is too much medication.Marland Kitchen He is left-handed is shaking today more than before. We may need to discontinue oral medications as she is on injectable antipsychotics already.  Principal Problem: Schizoaffective disorder, bipolar type ([place]) Diagnosis:   Patient Active Problem List   Diagnosis Date Noted  . Tobacco use disorder [F17.200] 12/27/2014  . HIV positive ([place]) [Z21] 12/26/2014  . Hypertension [I10] 12/26/2014  . Hypothyroidism [E03.9] 12/26/2014  . Dyslipidemia [E78.5] 12/26/2014  . Schizoaffective disorder, bipolar type ([place]) [F25.0] 10/25/2014  . Alcohol abuse [F10.10] 10/25/2014   Total Time spent with patient: 30 minutes  Past Psychiatric History: Schizoaffective disorder.  Past Medical History:  Past Medical History  Diagnosis Date  . HIV (human immunodeficiency  virus infection) ([place])   . BPH (benign prostatic hypertrophy)   . Dyslipidemia   . Hypertension   . Hypothyroidism   . Schizoaffective disorder ([place])    History reviewed. No pertinent past surgical history. Family History: History reviewed. No pertinent family history. Family Psychiatric  History: None reported. Social History:  History  Alcohol Use  . 1.2 - 1.8 oz/week  . 2-3 Cans of beer per week     History  Drug Use No    Social History   Social History  . Marital Status: Single    Spouse Name: N/A  . Number of Children: N/A  . Years of Education: N/A   Social History Main Topics  . Smoking status: Current Every Day Smoker -- 2.00 packs/day    Types: Cigarettes  . Smokeless tobacco: None  . Alcohol Use: 1.2 - 1.8 oz/week    2-3 Cans of beer per week  . Drug Use: No  . Sexual Activity: Not Asked   Other Topics Concern  . None   Social History Narrative   Additional Social History:                         Sleep: Fair  Appetite:  Fair  Current Medications: Current Facility-Administered Medications  Medication Dose Route Frequency Provider Last Rate Last Dose  . acetaminophen (TYLENOL) tablet 650 mg  650 mg Oral Q6H PRN Gonzella Lex, MD   650 mg at 01/10/15 1014  . alum & mag hydroxide-simeth (MAALOX/MYLANTA) 200-200-20 MG/5ML suspension 30 mL  30 mL Oral Q4H PRN  Gonzella Lex, MD      . aspirin EC tablet 81 mg  81 mg Oral Daily Gonzella Lex, MD   81 mg at 01/10/15 0914  . benztropine (COGENTIN) tablet 2 mg  2 mg Oral Daily Clovis Fredrickson, MD   2 mg at 01/10/15 0914  . carvedilol (COREG) tablet 3.125 mg  3.125 mg Oral BID WC Gonzella Lex, MD   3.125 mg at 01/10/15 0840  . cholecalciferol (VITAMIN D) tablet 2,000 Units  2,000 Units Oral Daily Gonzella Lex, MD   2,000 Units at 01/10/15 0914  . divalproex (DEPAKOTE) DR tablet 500 mg  500 mg Oral QHS Jolanta B Pucilowska, MD   500 mg at 01/09/15 2225  . efavirenz-emtricitabine-tenofovir  (ATRIPLA) 600-200-300 MG per tablet 1 tablet  1 tablet Oral QHS Gonzella Lex, MD   1 tablet at 01/09/15 2108  . fluPHENAZine (PROLIXIN) tablet 5 mg  5 mg Oral BID AC Gonzella Lex, MD   5 mg at 01/10/15 0839  . fluPHENAZine decanoate (PROLIXIN) injection 50 mg  50 mg Intramuscular Q14 Days Clovis Fredrickson, MD   50 mg at 01/01/15 0811  . levothyroxine (SYNTHROID, LEVOTHROID) tablet 25 mcg  25 mcg Oral QAC breakfast Gonzella Lex, MD   25 mcg at 01/10/15 [phone]  . lithium carbonate (ESKALITH) CR tablet 900 mg  900 mg Oral QHS Clovis Fredrickson, MD   900 mg at 01/09/15 2108  . lithium carbonate (LITHOBID) CR tablet 600 mg  600 mg Oral Q breakfast Jolanta B Pucilowska, MD   600 mg at 01/10/15 0839  . magnesium hydroxide (MILK OF MAGNESIA) suspension 30 mL  30 mL Oral Daily PRN Gonzella Lex, MD   30 mL at 01/10/15 0840  . paliperidone (INVEGA SUSTENNA) injection 234 mg  234 mg Intramuscular Q28 days Clovis Fredrickson, MD   234 mg at 01/01/15 0943  . paliperidone (INVEGA) 24 hr tablet 6 mg  6 mg Oral QHS Jolanta B Pucilowska, MD   6 mg at 01/09/15 2225  . simvastatin (ZOCOR) tablet 20 mg  20 mg Oral q1800 Gonzella Lex, MD   20 mg at 01/09/15 1753  . tamsulosin (FLOMAX) capsule 0.4 mg  0.4 mg Oral QPC supper Gonzella Lex, MD   0.4 mg at 01/09/15 1752  . temazepam (RESTORIL) capsule 30 mg  30 mg Oral QHS PRN Jolanta B Pucilowska, MD      . terazosin (HYTRIN) capsule 1 mg  1 mg Oral QHS Gonzella Lex, MD   1 mg at 01/09/15 2108    Lab Results: No results found for this or any previous visit (from the past 48 hour(s)).  Physical Findings: AIMS:  , ,  ,  ,    CIWA:    COWS:     Musculoskeletal: Strength & Muscle Tone: within normal limits Gait & Station: normal Patient leans: N/A  Psychiatric Specialty Exam: Review of Systems  Gastrointestinal: Positive for abdominal pain and constipation.  All other systems reviewed and are negative.   Blood pressure 125/84, pulse 67,  temperature 97.8 F (36.6 C), temperature source Oral, resp. rate 20, height 6' (1.829 m), weight 79.379 kg (175 lb), SpO2 100 %.Body mass index is 23.73 kg/(m^2).  General Appearance: Casual  Eye Contact::  Good  Speech:  Garbled and Pressured  Volume:  Increased  Mood:  Euphoric and Irritable  Affect:  Labile  Thought Process:  Goal Directed  Orientation:  Full (Time, Place, and Person)  Thought Content:  Delusions and Paranoid Ideation  Suicidal Thoughts:  No  Homicidal Thoughts:  No  Memory:  Immediate;   Fair Recent;   Fair Remote;   Fair  Judgement:  Poor  Insight:  Fair  Psychomotor Activity:  Increased  Concentration:  Fair  Recall:  [employer] of Knowledge:Fair  Language: Fair  Akathisia:  No  Handed:  Right  AIMS (if indicated):     Assets:  Communication Skills Desire for Improvement Financial Resources/Insurance Physical Health Resilience  ADL's:  Intact  Cognition: WNL  Sleep:  Number of Hours: 7.3   Treatment Plan Summary: Daily contact with patient to assess and evaluate symptoms and progress in treatment and Medication management   [name] is a 54 year old male with long history of schizoaffective disorder admitted for agitated and threatening behavior at the group home in the context of medication noncompliance.  1. Agitation. [name] is loud, agitated and disruptive again.   2. Mood and psychosis. We restarted lithium for mood stabilization and Prolixin for psychosis. He was given injections of [place] and Prolixin Decanoate to improve compliance. Lithium level is 0.5. Lithium has been increased to 1500 mg and low dose of depakote added. He has a history of high ammonia on Depakote. Li, VPA and Ammonia level are all low. We will increase Lithium to 1800 mg and switch to liquid to improve comliance.  3. Hypothyroidism. Continue Synthroid. TSH is normal.  4. HIV. Continue Atripla.  5. Hypertension. Continue antihypertensives. VS well  controlled  6. Dyslipidemia. Continue Zocor. Lipid panel is not elevated.  7. BPH. We will continue Flomax.  8. Social. The patient is not allowed to return to his group home. Currently homeless. Unable to care for self without supervision.  9. Insomnia. He has not been sleeping we will add Restoril.  10. Disposition. To be established.  Jolanta Pucilowska 01/10/2015, 12:32 PM

## 2015-01-10 NOTE — Progress Notes (Signed)
D: Patient stated slept fair last night .Stated appetite is good and energy level  Is normal. Stated concentration is good . Stated on Depression scale 5 , hopeless 5 and anxiety 5 . ( low 0-10 high) Denies suicidal  homicidal ideations  .  No auditory hallucinations  No pain concerns . Appropriate ADL'S. Interacting with peers and staff. Continue to voice of  Wanting to go home . Patient compliant of inguinal pain . MD informed of. Additional medication given . Patient voice he had been playing basketball all week A: Encourage patient participation with unit programming . Instruction  Given on  Medication , verbalize understanding. R: Voice no other concerns. Staff continue to monitor

## 2015-01-10 NOTE — Progress Notes (Signed)
Daniel MuirJamie did not have to use the PRN Restoril for insomnia, had a good and restful night, Estimated Sleep hours is 7.3, overall, the milieu was therapeutic, quiet and calm.

## 2015-01-10 NOTE — Plan of Care (Signed)
Problem: Ineffective individual coping Goal: STG: Patient will remain free from self harm Outcome: Progressing Medications administered at bedtime as ordered by the physician, complied with all medications, interacting with others well today, voice volume soft an not loud, did not communicate threat to staffs, very polite in his attitude during this shift. He has not written any notes and currently sleeping in bed since he was medicated; Restoril PRN available for insomnia, 15 minute checks maintained for safety, clinical and moral support provided, patient encouraged to continue to express feelings and demonstrate safe care. Patient remain free from harm, will continue to monitor.

## 2015-01-10 NOTE — Progress Notes (Signed)
Quiet, visible in the milieu and interacting well. Will continue to monitor behavior and manage symptoms.

## 2015-01-10 NOTE — [facility] Group Notes (Signed)
[facility] Group Notes:  (Nursing/MHT/Case Management/Adjunct)  Date:  01/10/2015  Time:  3:38 PM  Type of Therapy:  Psychoeducational Skills  Participation Level:  Did Not Attend   [name] 01/10/2015, 3:38 PM 

## 2015-01-11 ENCOUNTER — Inpatient Hospital Stay: Payer: Medicare Other

## 2015-01-11 ENCOUNTER — Encounter: Payer: Self-pay | Admitting: Internal Medicine

## 2015-01-11 DIAGNOSIS — N5082 Scrotal pain: Secondary | ICD-10-CM | POA: Diagnosis not present

## 2015-01-11 LAB — BASIC METABOLIC PANEL
ANION GAP: 6 (ref 5–15)
BUN: 10 mg/dL (ref 6–20)
CALCIUM: 8.6 mg/dL — AB (ref 8.9–10.3)
CO2: 27 mmol/L (ref 22–32)
CREATININE: 0.84 mg/dL (ref 0.61–1.24)
Chloride: 108 mmol/L (ref 101–111)
GFR calc Af Amer: >60 mL/min (ref >60)
GLUCOSE: 89 mg/dL (ref 65–99)
Potassium: 4.2 mmol/L (ref 3.5–5.1)
Sodium: 141 mmol/L (ref 135–145)

## 2015-01-11 MED ORDER — IOHEXOL 240 MG/ML SOLN: 50.0000 mL | Freq: Once | INTRAMUSCULAR | Status: DC | PRN

## 2015-01-11 MED ORDER — IOHEXOL 300 MG/ML  SOLN
100.0000 mL | Freq: Once | INTRAMUSCULAR | Status: AC | PRN
  Administered 2015-01-11: 100 mL via INTRAVENOUS

## 2015-01-11 MED ORDER — PROMETHAZINE HCL 25 MG PO TABS
25.0000 mg | ORAL_TABLET | Freq: Four times a day (QID) | ORAL | Status: DC | PRN
  Administered 2015-01-11 – 2015-01-15 (×3): 25 mg via ORAL
  Filled 2015-01-11 (×4): qty 1

## 2015-01-11 MED ORDER — IOHEXOL 240 MG/ML SOLN: 25.0000 mL | INTRAMUSCULAR | Status: DC

## 2015-01-11 NOTE — Progress Notes (Signed)
Recreation Therapy Notes  Date: 10.21.16 Time: 3:00 pm Location: Craft Room  Group Topic: Coping Skills  Goal Area(s) Addresses:  Patient will participate in coloring activity. Patient will verbalize benefit of art as a coping skills  Behavioral Response: Attentive, Interactive, Disruptive, Left early  Intervention: Coloring  Activity: Patients were given different coloring sheets and instructed to think about what emotions they were feeling and what they were focused on while they were coloring.  Education: LRT educated patients on healthy coping skills and why they are important.  Education Outcome: Patient left before LRT educated group.  Clinical Observations/Feedback: Patient colored two pictures. LRT had to redirect patient multiple times for talking. Patient difficult to redirect. Patient contributed to group discussion by stating what emotions he felt while he was coloring. Patient left group at approximately 3:45 pm. Patient did not return to group.  [name],[name], LRT/CTRS 01/11/2015 4:18 PM

## 2015-01-11 NOTE — Progress Notes (Signed)
PIV placement and Labs specimen required prior to CT Scan, nursing supervisor, Ms Judeth CornfieldStephanie informed.

## 2015-01-11 NOTE — Consult Note (Signed)
[facility] [place] [facility]   PATIENT NAME: Daniel MoundJaime Benitez    MR#:  [id number]  DATE OF BIRTH:  06/15/1960  DATE OF ADMISSION:  12/26/2014  PRIMARY CARE PHYSICIAN: No PCP Per Patient    REQUESTING/REFERRING PHYSICIAN: Faheem  CHIEF COMPLAINT:  No chief complaint on file.  scrotal pain  HISTORY OF PRESENT ILLNESS:  [name]  is a 54 y.o. male with a known history of essential hypertension, hypothyroidism unspecified, HIV complaining of scrotal pain. He is originally admitted by the psychiatric services 12/27/2014 for agitation and psychosis from his group home. He now has one day duration complaints of scrotal pain or right sided per documentation his first complaint occurred around 5 PM on 10/20 for which she received some Tylenol the second complaint was around 10:30 PM of the same day at that time he complained scrotal pain described only as "pain" 10/10 intensity. On my evaluation he actually has no complaints at this time. When asked about his scrotal pain and swelling he states that he had swelling for approximately 3-4 years however is been relatively asymptomatic but it did hurt earlier today.  PAST MEDICAL HISTORY:   Past Medical History  Diagnosis Date  . HIV (human immunodeficiency virus infection) ([facility])   . BPH (benign prostatic hypertrophy)   . Dyslipidemia   . Hypertension   . Hypothyroidism   . Schizoaffective disorder ([facility])     PAST SURGICAL HISTOIRY:  History reviewed. No pertinent past surgical history.  SOCIAL HISTORY:   Social History  Substance Use Topics  . Smoking status: Current Every Day Smoker -- 2.00 packs/day    Types: Cigarettes  . Smokeless tobacco: Not on file  . Alcohol Use: 1.2 - 1.8 oz/week    2-3 Cans of beer per week    FAMILY HISTORY:   Family History  Problem Relation Age of Onset  . Hypertension Other     DRUG ALLERGIES:  No Known Allergies  REVIEW OF SYSTEMS:  CONSTITUTIONAL: No fever,  fatigue or weakness.  EYES: No blurred or double vision.  EARS, NOSE, AND THROAT: No tinnitus or ear pain.  RESPIRATORY: No cough, shortness of breath, wheezing or hemoptysis.  CARDIOVASCULAR: No chest pain, orthopnea, edema.  GASTROINTESTINAL: No nausea, vomiting, diarrhea or abdominal pain.  GENITOURINARY: No dysuria, hematuria.  ENDOCRINE: No polyuria, nocturia,  HEMATOLOGY: No anemia, easy bruising or bleeding SKIN: No rash or lesion. MUSCULOSKELETAL: No joint pain or arthritis.   NEUROLOGIC: No tingling, numbness, weakness.  PSYCHIATRY: No anxiety or depression.   MEDICATIONS AT HOME:   Prior to Admission medications   Medication Sig Start Date End Date Taking? Authorizing Provider  aspirin EC 81 MG tablet Take 81 mg by mouth daily.   Yes Historical Provider, MD  benztropine (COGENTIN) 1 MG tablet Take 1 mg by mouth at bedtime.   Yes Historical Provider, MD  carvedilol (COREG) 3.125 MG tablet Take 3.125 mg by mouth 2 (two) times daily.    Yes Historical Provider, MD  Cholecalciferol (VITAMIN D) 2000 UNITS CAPS Take 2,000 Units by mouth daily.   Yes Historical Provider, MD  divalproex (DEPAKOTE ER) 250 MG 24 hr tablet Take 250 mg by mouth at bedtime.   Yes Historical Provider, MD  docusate sodium (COLACE) 100 MG capsule Take 100 mg by mouth at bedtime.    Yes Historical Provider, MD  fluPHENAZine (PROLIXIN) 5 MG tablet Take 5 mg by mouth 2 (two) times daily.    Yes Historical Provider, MD  levothyroxine (SYNTHROID, LEVOTHROID) 25 MCG tablet Take 25 mcg by mouth daily.    Yes Historical Provider, MD  lithium carbonate 300 MG capsule Take 600 mg by mouth 2 (two) times daily.   Yes Historical Provider, MD  paliperidone (INVEGA SUSTENNA) 234 MG/1.5ML SUSP injection Inject 234 mg into the muscle every 30 (thirty) days.   Yes Historical Provider, MD  simvastatin (ZOCOR) 20 MG tablet Take 20 mg by mouth at bedtime.    Yes Historical Provider, MD  tamsulosin (FLOMAX) 0.4 MG CAPS capsule Take  0.4 mg by mouth at bedtime.   Yes Historical Provider, MD  terazosin (HYTRIN) 1 MG capsule Take 1 mg by mouth at bedtime.   Yes Historical Provider, MD      VITAL SIGNS:  Blood pressure 144/91, pulse 81, temperature 98.7 F (37.1 C), temperature source Oral, resp. rate 22, height 6' (1.829 m), weight 175 lb (79.379 kg), SpO2 100 %.  PHYSICAL EXAMINATION:  GENERAL:  55 y.o.-year-old patient lying in his chair in no acute distress.  EYES: Pupils equal, round, reactive to light and accommodation. No scleral icterus. Extraocular muscles intact.  HEENT: Head atraumatic, normocephalic. Oropharynx and nasopharynx clear.  NECK:  Supple, no jugular venous distention. No thyroid enlargement, no tenderness.  LUNGS: Normal breath sounds bilaterally, no wheezing, rales,rhonchi or crepitation. No use of accessory muscles of respiration.  CARDIOVASCULAR: S1, S2 normal. No murmurs, rubs, or gallops.  ABDOMEN: Soft, nontender, nondistended. Bowel sounds present. No organomegaly or mass.  Genitourinary: Large swelling within the right scrotum, difficult to reduce however nontender to palpation EXTREMITIES: No pedal edema, cyanosis, or clubbing.  NEUROLOGIC: Cranial nerves II through XII are intact. Muscle strength 5/5 in all extremities. Sensation intact. Gait not checked.  PSYCHIATRIC: The patient is alert and oriented x 3.  SKIN: No obvious rash, lesion, or ulcer.   LABORATORY PANEL:   CBC No results for input(s): WBC, HGB, HCT, PLT in the last 168 hours. ------------------------------------------------------------------------------------------------------------------  Chemistries  No results for input(s): NA, K, CL, CO2, GLUCOSE, BUN, CREATININE, CALCIUM, MG, AST, ALT, ALKPHOS, BILITOT in the last 168 hours.  Invalid input(s): GFRCGP ------------------------------------------------------------------------------------------------------------------  Cardiac Enzymes No results for input(s):  TROPONINI in the last 168 hours. ------------------------------------------------------------------------------------------------------------------  RADIOLOGY:  No results found.  EKG:   Orders placed or performed in visit on 02/23/14  . EKG 12-Lead    IMPRESSION AND PLAN:   54 year old African-American gentleman history of essential hypertension as well as HIV complaining of scrotal pain  1. Scrotal pain: Concerning for incarcerated inguinal hernia. However, given his pain was relieved by Tylenol there is no tenderness on palpation this is somewhat reassuring. We will perform a CAT scan to see if this is actually incarcerated hernia. If that is the case we'll get Gen. surgery involvement.  2. Essential hypertension Coreg 3. Hyperlipidemia unspecified: Statin therapy 4. Psychosis with mood disorder: Care per primary services   All the records are reviewed and case discussed with Consulting provider. Management plans discussed with the patient, family and they are in agreement.  CODE STATUS: Full  TOTAL TIME TAKING CARE OF THIS PATIENT: 35 minutes.    Secilia Apps,  Mardi Mainland.D on 01/11/2015 at 12:08 AM  Between 7am to 6pm - Pager - [phone]  After 6pm: House Pager: - [phone]  [facility]  Office  [phone]  CC: Primary care Physician: No PCP Per Patient

## 2015-01-11 NOTE — Plan of Care (Signed)
Problem: Consults Goal: Depression Patient Education See Patient Education Module for education specifics.  Outcome: Progressing Patient states that depression is decreasing and that he is ready for d/c

## 2015-01-11 NOTE — Progress Notes (Signed)
Patient demonstrating questionable reaction formation; he wants to be discharged and at the same time saying "if you discharge me, I will be right back here in 2 weeks .Marland Kitchen..." [facility].

## 2015-01-11 NOTE — Progress Notes (Signed)
D: patient seen in the milieu several times this shift.  Patient less irritable this shift.  Patient states that his depression is a 10 and anxiety is a 10.  Patient denies any SI at this time.  Patient dressed in his clothes with a angry affect.  Patient complaint with medications in the mornings but as the evenings come patient denies all medications.  Patient in no distress at this time.  A: support and encouragement provided.  Medications given as prescribed.  q 15 min checks done R: patient not receptive of the information and continues to be non compliant with treatment at times.

## 2015-01-11 NOTE — Progress Notes (Signed)
Patient ID: [name], male   DOB: 1960/12/01, 54 y.o.   MRN: [id number] [facility] PROGRESS NOTE  PCP: No PCP Per Patient  HPI/Subjective: Patient states that he does not want surgery on his hernia. Had pain last night. He has very pressured speech and continuously talking the entire time I was in there. Patient has had a hernia for years.  Objective: Filed Vitals:   01/11/15 0710  BP: 117/78  Pulse: 76  Temp: 97.8 F (36.6 C)  Resp: 20    Filed Weights   12/26/14 2253  Weight: 79.379 kg (175 lb)    ROS: Review of Systems  Unable to perform ROS  difficult to obtain with the patient's pressured speech  Exam: Physical Exam  HENT:  Nose: No mucosal edema.  Mouth/Throat: No oropharyngeal exudate or posterior oropharyngeal edema.  Eyes: Conjunctivae, EOM and lids are normal. Pupils are equal, round, and reactive to light.  Neck: No JVD present. Carotid bruit is not present. No edema present. No thyroid mass and no thyromegaly present.  Cardiovascular: S1 normal and S2 normal.  Exam reveals no gallop.   No murmur heard. Pulses:      Dorsalis pedis pulses are 2+ on the right side, and 2+ on the left side.  Respiratory: No respiratory distress. He has no wheezes. He has no rhonchi. He has no rales.  GI: Soft. Bowel sounds are normal. There is no tenderness. A hernia is present. Hernia confirmed positive in the right inguinal area.  Genitourinary:  Right inguinal hernia going down into the scrotum. No pain to palpation over hernia or testicles.  Musculoskeletal:       Right ankle: He exhibits no swelling.       Left ankle: He exhibits no swelling.  Lymphadenopathy:    He has no cervical adenopathy.  Neurological: He is alert. No cranial nerve deficit.  Skin: Skin is warm. No rash noted. Nails show no clubbing.  Psychiatric: His speech is rapid and/or pressured.      Data Reviewed: Basic Metabolic Panel:  Recent Labs Lab 01/11/15 0706  NA 141  K  4.2  CL 108  CO2 27  GLUCOSE 89  BUN 10  CREATININE 0.84  CALCIUM 8.6*     Recent Labs Lab 01/08/15 0746  AMMONIA 27     Studies: Ct Abdomen Pelvis W Contrast  01/11/2015  CLINICAL DATA:  Right inguinal/scrotal pain.  HIV. EXAM: CT ABDOMEN AND PELVIS WITH CONTRAST TECHNIQUE: Multidetector CT imaging of the abdomen and pelvis was performed using the standard protocol following bolus administration of intravenous contrast. CONTRAST:  OMNIPAQUE IOHEXOL 300 MG/ML  SOLN COMPARISON:  None. FINDINGS: Lower chest: Trace layering bilateral pleural effusions. Hepatobiliary: Normal liver with no liver mass. Normal gallbladder with no radiopaque cholelithiasis. No biliary ductal dilatation. Pancreas: Normal, with no mass or duct dilation. Spleen: Normal size. No mass. Adrenals/Urinary Tract: Normal adrenals. Mild fullness of the right renal collecting system without overt right hydronephrosis. No left hydronephrosis. Suggestion of a nonobstructing 2 mm lower left renal stone. Exophytic simple 1.6 cm renal cyst in the upper left kidney. Subcentimeter hypodense anterior upper right renal lesion, too small to characterize. Normal bladder. Stomach/Bowel: Grossly normal fluid and debris filled stomach. Normal caliber small bowel with no small bowel wall thickening. Normal appendix. There is focal colonic narrowing and apparent wall thickening at the junction of the splenic flexure and transverse colon (series 2/ image 42), which could be artifactual due to underdistention/peristalsis, however a colonic  mass cannot be excluded. Otherwise normal large bowel. Vascular/Lymphatic: Normal caliber abdominal aorta. Patent portal, splenic, hepatic and renal veins. No pathologically enlarged lymph nodes in the abdomen or pelvis. Reproductive:  Top normal size prostate. Other: No pneumoperitoneum, ascites or focal fluid collection. Musculoskeletal: No aggressive appearing focal osseous lesions. Severe degenerative  disc disease is present at L5-S1. Mild to moderate degenerative disc disease is present in the remaining lumbar spine. There is a moderate right inguinal hernia containing simple density fluid. IMPRESSION: 1. Moderate right inguinal hernia containing simple density fluid. 2. No evidence of bowel obstruction or acute bowel inflammation. Normal appendix. 3. Focal colonic narrowing and apparent wall thickening at the junction of the splenic flexure and transverse colon, cannot exclude a colonic mass. Recommend correlation with colonoscopy. 4. Trace layering bilateral pleural effusions. 5. Possible nonobstructing 2 mm lower left renal stone. No hydronephrosis. Electronically Signed   By: Delbert PhenixJason A Poff M.D.   On: 01/11/2015 09:26    Scheduled Meds: . aspirin EC  81 mg Oral QAC breakfast  . benztropine  2 mg Oral QAC breakfast  . carvedilol  3.125 mg Oral BID WC  . cholecalciferol  2,000 Units Oral QAC breakfast  . divalproex  500 mg Oral QHS  . efavirenz-emtricitabine-tenofovir  1 tablet Oral QHS  . fluPHENAZine  5 mg Oral QHS  . fluPHENAZine decanoate  50 mg Intramuscular Q14 Days  . levothyroxine  25 mcg Oral QAC breakfast  . lithium carbonate  900 mg Oral Once  . lithium citrate  600 mg Oral TID  . magnesium citrate  1 Bottle Oral Once  . paliperidone  234 mg Intramuscular Q28 days  . paliperidone  3 mg Oral QHS  . simvastatin  20 mg Oral q1800  . tamsulosin  0.4 mg Oral QPC supper  . terazosin  1 mg Oral QHS     Assessment/Plan:   1. Right inguinal hernia into the scrotum. Patient refused any surgical evaluation. This has been going on for 3 or 4 years. This is not an incarcerated hernia. 2. HIV positive- on a Atripla. Follows with Dr. [name] as outpatient 3. Essential hypertension- blood pressure stable on usual medications 4. Hyperlipidemia unspecified on pravastatin 5. BPH without urinary obstruction- change combination of Terazosin and Flomax 6. Hypothyroidism unspecified-  continue levothyroxine 7. Psychiatry managing the psychiatric issues.  Code Status:     Code Status Orders        Start     Ordered   12/26/14 2308  Full code   Continuous     12/26/14 2307      Disposition Plan: As per psychiatry  Time spent: 35 minutes  Alford HighlandWIETING, Lugene Beougher  [facility]

## 2015-01-11 NOTE — Progress Notes (Signed)
[facility] MD Progress Note  01/11/2015 11:04 AM [name]  MRN:  [id number]  Subjective: [name] is loud, irritable, intrusive, disruptive, and difficult to redirect. At this morning he was excited about seeing his act team representative at noon. He now is complaining bitterly about difficulties walking and asks for a wheelchair. He tells me that he will stop taking medications immediately when he leaves the hospital. He believes that his medications gather in his hernia and make it hurt. He is had hernia 4 for 5 years. He is very adamant about not wanting to have surgery for. He had several bowel movements yesterday when given laxatives. His abdomen is not acute. We are uncertain if the patient will get a [name] as he is considered to be unstable and dangerous by the [facility] assessor.   Principal Problem: Schizoaffective disorder, bipolar type ([place]) Diagnosis:   Patient Active Problem List   Diagnosis Date Noted  . Scrotal pain [N50.82] 01/11/2015  . Tobacco use disorder [F17.200] 12/27/2014  . HIV positive ([place]) [Z21] 12/26/2014  . Hypertension [I10] 12/26/2014  . Hypothyroidism [E03.9] 12/26/2014  . Dyslipidemia [E78.5] 12/26/2014  . Schizoaffective disorder, bipolar type ([place]) [F25.0] 10/25/2014  . Alcohol abuse [F10.10] 10/25/2014   Total Time spent with patient: 20 minutes  Past Psychiatric History: Long history of schizoaffective disorder.  Past Medical History:  Past Medical History  Diagnosis Date  . HIV (human immunodeficiency virus infection) ([place])   . BPH (benign prostatic hypertrophy)   . Dyslipidemia   . Hypertension   . Hypothyroidism   . Schizoaffective disorder ([place])    History reviewed. No pertinent past surgical history. Family History:  Family History  Problem Relation Age of Onset  . Hypertension Other    Family Psychiatric  History: None reported. Social History:  History  Alcohol Use  . 1.2 - 1.8 oz/week  . 2-3 Cans of beer per week      History  Drug Use No    Social History   Social History  . Marital Status: Single    Spouse Name: N/A  . Number of Children: N/A  . Years of Education: N/A   Social History Main Topics  . Smoking status: Current Every Day Smoker -- 2.00 packs/day    Types: Cigarettes  . Smokeless tobacco: None  . Alcohol Use: 1.2 - 1.8 oz/week    2-3 Cans of beer per week  . Drug Use: No  . Sexual Activity: Not Asked   Other Topics Concern  . None   Social History Narrative   Additional Social History:                         Sleep: Good  Appetite:  Good  Current Medications: Current Facility-Administered Medications  Medication Dose Route Frequency Provider Last Rate Last Dose  . acetaminophen (TYLENOL) tablet 650 mg  650 mg Oral Q6H PRN Gonzella Lex, MD   650 mg at 01/10/15 2226  . alum & mag hydroxide-simeth (MAALOX/MYLANTA) 200-200-20 MG/5ML suspension 30 mL  30 mL Oral Q4H PRN Gonzella Lex, MD      . aspirin EC tablet 81 mg  81 mg Oral QAC breakfast Clovis Fredrickson, MD   81 mg at 01/11/15 0807  . benztropine (COGENTIN) tablet 2 mg  2 mg Oral QAC breakfast Clovis Fredrickson, MD   2 mg at 01/11/15 0808  . carvedilol (COREG) tablet 3.125 mg  3.125 mg Oral BID WC John  T Clapacs, MD   3.125 mg at 01/11/15 0807  . cholecalciferol (VITAMIN D) tablet 2,000 Units  2,000 Units Oral QAC breakfast Clovis Fredrickson, MD   2,000 Units at 01/11/15 0807  . divalproex (DEPAKOTE) DR tablet 500 mg  500 mg Oral QHS Clovis Fredrickson, MD   500 mg at 01/10/15 2134  . efavirenz-emtricitabine-tenofovir (ATRIPLA) 600-200-300 MG per tablet 1 tablet  1 tablet Oral QHS Gonzella Lex, MD   1 tablet at 01/10/15 2134  . fluPHENAZine (PROLIXIN) tablet 5 mg  5 mg Oral QHS Garan Frappier B Jordyan Hardiman, MD      . fluPHENAZine decanoate (PROLIXIN) injection 50 mg  50 mg Intramuscular Q14 Days Myeisha Kruser B Mindy Gali, MD   50 mg at 01/01/15 0811  . iohexol (OMNIPAQUE) 240 MG/ML injection 50 mL  50 mL  Oral Once PRN Medication Radiologist, MD      . levothyroxine (SYNTHROID, LEVOTHROID) tablet 25 mcg  25 mcg Oral QAC breakfast Gonzella Lex, MD   25 mcg at 01/11/15 0807  . lithium carbonate (ESKALITH) CR tablet 900 mg  900 mg Oral Once Clovis Fredrickson, MD   900 mg at 01/10/15 2216  . lithium citrate 300 MG/5ML solution 600 mg  600 mg Oral TID Clovis Fredrickson, MD   600 mg at 01/11/15 0807  . magnesium citrate solution 1 Bottle  1 Bottle Oral Once Clovis Fredrickson, MD   1 Bottle at 01/10/15 1432  . magnesium hydroxide (MILK OF MAGNESIA) suspension 30 mL  30 mL Oral Daily PRN Gonzella Lex, MD   30 mL at 01/10/15 0840  . paliperidone (INVEGA SUSTENNA) injection 234 mg  234 mg Intramuscular Q28 days Clovis Fredrickson, MD   234 mg at 01/01/15 0943  . paliperidone (INVEGA) 24 hr tablet 3 mg  3 mg Oral QHS Clovis Fredrickson, MD   3 mg at 01/10/15 2134  . simvastatin (ZOCOR) tablet 20 mg  20 mg Oral q1800 Gonzella Lex, MD   20 mg at 01/10/15 1853  . tamsulosin (FLOMAX) capsule 0.4 mg  0.4 mg Oral QPC supper Gonzella Lex, MD   0.4 mg at 01/10/15 1853  . temazepam (RESTORIL) capsule 30 mg  30 mg Oral QHS PRN Clovis Fredrickson, MD   30 mg at 01/10/15 2226  . terazosin (HYTRIN) capsule 1 mg  1 mg Oral QHS Gonzella Lex, MD   1 mg at 01/10/15 2134    Lab Results:  Results for orders placed or performed during the hospital encounter of 12/26/14 (from the past 48 hour(s))  Basic metabolic panel     Status: Abnormal   Collection Time: 01/11/15  7:06 AM  Result Value Ref Range   Sodium 141 135 - 145 mmol/L   Potassium 4.2 3.5 - 5.1 mmol/L   Chloride 108 101 - 111 mmol/L   CO2 27 22 - 32 mmol/L   Glucose, Bld 89 65 - 99 mg/dL   BUN 10 6 - 20 mg/dL   Creatinine, Ser 0.84 0.61 - 1.24 mg/dL   Calcium 8.6 (L) 8.9 - 10.3 mg/dL   GFR calc non Af Amer >60 >60 mL/min   GFR calc Af Amer >60 >60 mL/min    Comment: (NOTE) The eGFR has been calculated using the CKD EPI equation. This  calculation has not been validated in all clinical situations. eGFR's persistently <60 mL/min signify possible Chronic Kidney Disease.    Anion gap 6 5 - 15  Physical Findings: AIMS:  , ,  ,  ,    CIWA:    COWS:     Musculoskeletal: Strength & Muscle Tone: within normal limits Gait & Station: normal Patient leans: N/A  Psychiatric Specialty Exam: Review of Systems  All other systems reviewed and are negative.   Blood pressure 117/78, pulse 76, temperature 97.8 F (36.6 C), temperature source Oral, resp. rate 20, height 6' (1.829 m), weight 79.379 kg (175 lb), SpO2 100 %.Body mass index is 23.73 kg/(m^2).  General Appearance: Casual  Eye Contact::  Good  Speech:  Pressured and Slurred  Volume:  Increased  Mood:  Angry, Dysphoric and Irritable  Affect:  Labile  Thought Process:  Goal Directed  Orientation:  Full (Time, Place, and Person)  Thought Content:  Delusions and Paranoid Ideation  Suicidal Thoughts:  No  Homicidal Thoughts:  No  Memory:  Immediate;   Fair Recent;   Fair Remote;   Fair  Judgement:  Poor  Insight:  Shallow  Psychomotor Activity:  Increased  Concentration:  Fair  Recall:  [employer] of Knowledge:Fair  Language: Fair  Akathisia:  No  Handed:  Right  AIMS (if indicated):     Assets:  Communication Skills Desire for Improvement Financial Resources/Insurance Physical Health Resilience  ADL's:  Intact  Cognition: WNL  Sleep:  Number of Hours: 6.15   Treatment Plan Summary: Daily contact with patient to assess and evaluate symptoms and progress in treatment and Medication management   Mr. Simonis is a 54 year old male with long history of schizoaffective disorder admitted for agitated and threatening behavior at the group home in the context of medication noncompliance.  1. Agitation. Mr. Sookram is loud, agitated and disruptive again.   2. Mood and psychosis. We restarted lithium for mood stabilization and Prolixin for psychosis. He was  given injections of [place] and Prolixin Decanoate to improve compliance. Lithium level was low on admission and we increased the dose. It is low again indicating that the patient has not been compliant. We increased Lithium dose to 1800 mg and switched to liquid to improve compliance. He is on low dose of depakote added. He has a history of high ammonia on Depakote. Li, VPA and Ammonia level are all low.   3. Hypothyroidism. Continue Synthroid. TSH is normal.  4. HIV. Continue Atripla.  5. Hypertension. Continue antihypertensives. VS well controlled  6. Dyslipidemia. Continue Zocor. Lipid panel is not elevated.  7. BPH. We will continue Flomax.  8. Social. The patient is not allowed to return to his group home. Currently homeless. Unable to care for self without supervision.  9. Insomnia. He has not been sleeping we will add Restoril.  10. Constipation. The patient will start bowel regimen he was given mag citrate yesterday with good result. He continues to complain about his hernia. Abdomen is soft.   11. Disposition. To be established.  Catrice Zuleta 01/11/2015, 11:04 AM

## 2015-01-11 NOTE — [facility] Group Notes (Signed)
[facility] Group Notes:  (Nursing/MHT/Case Management/Adjunct)  Date:  01/11/2015  Time:  2:00 PM  Type of Therapy:  Psychoeducational Skills  Participation Level:  Did Not Attend   Lynelle SmokeCara Travis [facility] 01/11/2015, 2:00 PM

## 2015-01-11 NOTE — [facility] Group Notes (Signed)
[facility] Group Notes:  (Nursing/MHT/Case Management/Adjunct)  Date:  01/11/2015  Time:  5:49 AM  Type of Therapy:  Group Therapy  Participation Level:  Active  Participation Quality:  Appropriate  Affect:  Irritable  Cognitive:  Alert  Insight:  Appropriate and Good  Engagement in Group:  Engaged  Modes of Intervention:  Discussion  Summary of Progress/Problems:  Truong Delcastillo Joy Shera Laubach 01/11/2015, 5:49 AM

## 2015-01-11 NOTE — [facility] Group Notes (Signed)
[facility] LCSW Group Therapy  01/11/2015 2:15 PM   Feelings around Relapse. Group members discussed the meaning of relapse and shared personal stories of relapse, how it affected them and others, and how they perceived themselves during this time. Group members were encouraged to identify triggers, warning signs and coping skills used when facing the possibility of relapse. Social supports were discussed and explored in detail. Patient made an attempt to attend group; however, was unable to stay due to stomach discomfort resulting in his inability to sit for an extended period of time.    Jenel LucksJasmine Lewis, Clinical Social Work Intern 01/11/2015, 2:15 PM

## 2015-01-11 NOTE — Progress Notes (Signed)
Patient had an episode of nausea, on call doctor paged to get medication for nausea

## 2015-01-11 NOTE — Plan of Care (Addendum)
Problem: Ineffective individual coping Goal: STG: Patient will remain free from self harm Outcome: Progressing Medications administered at bed time as ordered by the physician, involuntary movements of left hand ongoing (not a new onset), drooling reduced, patient is monitored closely for early signs and symptoms of Lithium Toxicity; Tylenol 650 mg and Restoril 30 mg PO PRNs given for pain and insomnia. Hospitalist, Dr. Clint GuyHower examined patient in his room, ordered CT Scan to r/o incarcerated inguinal hernia. Ms Michel Bickersam Roush called to schedule CT Scan in the morning, patient will be prepared by drinking contrast 1 each hour (2 bottles in the Staff Refrigerator) when he wakes up prior to CT Scan. Explanation of anticipated procedure provided, "I am not going to let them remove my balls, no surgery .Marland Kitchen..."  Explanation of medications Therapeutic/Expected Effects, SEs and Adverse effects discussed, questions encouraged; 15 minute checks maintained for safety, clinical and moral support provided, patient encouraged to continue to express feelings and demonstrate safe care. Patient remains free from harm, will continue to monitor.

## 2015-01-12 NOTE — Progress Notes (Addendum)
Patient ID: [name], male   DOB: 1960/10/21, 54 y.o.   MRN: [id number]  [name] continues to write excessive amount of notes. In one note he states "This is a life or death situation because if I kills someone, then myself. I will not be laughing." Doctor and nurse were informed. He has been denied a PASRR number for the second time.   Daisy FloroCandace L Romina Divirgilio MSW, LCSWA  01/12/2015 3:07 PM

## 2015-01-12 NOTE — Plan of Care (Signed)
Problem: Ineffective individual coping Goal: LTG: Patient will report a decrease in negative feelings Outcome: Not Progressing Patient reports being very angry because he was [facility] by the doctor [facility]  Goal: STG: Pt will be able to identify effective and ineffective STG: Pt will be able to identify effective and ineffective coping patterns  Outcome: Progressing Patient is making an effort to be appropriately interactive with his peers in the milieu. He was also willing to take sleep medication.  Goal: STG: Patient will remain free from self harm Outcome: Not Progressing No self harm.

## 2015-01-12 NOTE — [facility] Group Notes (Signed)
[facility] LCSW Group Therapy  01/12/2015 2:16 PM  Type of Therapy:  Group Therapy  Participation Level:  Did Not Attend  Modes of Intervention:  Discussion, Education, Socialization and Support  Summary of Progress/Problems: Pt will identify unhealthy thoughts and how they impact their emotions and behavior. Pt will be encouraged to discuss these thoughts, emotions and behaviors with the group.   [name] L [name] MSW, LCSWA  01/12/2015, 2:16 PM 

## 2015-01-12 NOTE — Progress Notes (Signed)
Patient ID: Orlin Sellen, male   DOB: 04/05/1960, 54 y.o.   MRN: [id number] [facility] PROGRESS NOTE  PCP: No PCP Per Patient  HPI/Subjective: Patient states that he is having back pain now groin pain is resolved once to go home   Objective: Filed Vitals:   01/12/15 0712  BP: 111/75  Pulse: 99  Temp: 97.6 F (36.4 C)  Resp: 20    Filed Weights   12/26/14 2253  Weight: 79.379 kg (175 lb)    ROS: Review of Systems  Unable to perform ROS  difficult to obtain with the patient's pressured speech  Exam: Physical Exam  [facility] [phone] edema.  Mouth/Throat: No oropharyngeal exudate or post[place] G>eral pleural effusions. Hepatobiliary: Normal liver with no liver mass. Normal gallbladder with no radiopaque cholelithiasis. No biliary ductal dilatation. Pancreas: Normal, with no mass or duct dilation. Spleen: Normal size. No mass. Adrenals/Urinary Tract: Normal adrenals. Mild fullness of the right renal collecting system without overt right hydronephrosis. No left hydronephrosis. Suggestion of a nonobstructing 2 mm lower left renal stone. Exophytic simple 1.6 cm renal cyst in the upper left kidney. Subcentimeter hypodense anterior upper right renal lesion, too small to characterize. Normal bladder. Stomach/Bowel: Grossly normal fluid and debris filled stomach. Normal caliber small bowel with no small bowel wall thickening. Normal appendix. There is focal colonic narrowing and apparent wall thickening at the junction of the splenic flexure and transverse colon (series 2/ image 42), which could be artifactual due to underdistention/peristalsis, however a colonic mass cannot be excluded. Otherwise normal large bowel. Vascular/Lymphatic: Normal caliber abdominal aorta. Patent portal, splenic, hepatic and renal  veins. No pathologically enlarged lymph nodes in the abdomen or pelvis. Reproductive:  Top normal size prostate. Other: No pneumoperitoneum, ascites or focal fluid collection. Musculoskeletal: No aggressive appearing focal osseous lesions. Severe degenerative disc disease is present at L5-S1. Mild to moderate degenerative disc disease is present in the remaining lumbar spine.  There is a moderate right inguinal hernia containing simple density fluid. IMPRESSION: 1. Moderate right inguinal hernia containing simple density fluid. 2. No evidence of bowel obstruction or acute bowel inflammation. Normal appendix. 3. Focal colonic narrowing and apparent wall thickening at the junction of the splenic flexure and transverse colon, cannot exclude a colonic mass. Recommend correlation with colonoscopy. 4. Trace layering bilateral pleural effusions. 5. Possible nonobstructing 2 mm lower left renal stone. No hydronephrosis. Electronically Signed   By: Delbert Phenix M.D.   On: 01/11/2015 09:26    Scheduled Meds: . aspirin EC  81 mg Oral QAC breakfast  . benztropine  2 mg Oral QAC breakfast  . carvedilol  3.125 mg Oral BID WC  . cholecalciferol  2,000 Units Oral QAC breakfast  . divalproex  500 mg Oral QHS  . efavirenz-emtricitabine-tenofovir  1 tablet Oral QHS  . fluPHENAZine  5 mg Oral QHS  . fluPHENAZine decanoate  50 mg Intramuscular Q14 Days  . levothyroxine  25 mcg Oral QAC breakfast  . lithium carbonate  900 mg Oral Once  . lithium citrate  600 mg Oral TID  . magnesium citrate  1 Bottle Oral Once  . paliperidone  234 mg Intramuscular Q28 days  . paliperidone  3 mg Oral QHS  . simvastatin  20 mg Oral q1800  . tamsulosin  0.4 mg Oral QPC supper  . terazosin  1 mg Oral QHS     Assessment/Plan:   1. Right inguinal hernia into the scrotum. Patient refused any surgical evaluation. CT scan shows hernia, at this point no further therapy needed 2. HIV positive- on a Atripla. Follows with Dr. Sampson Goon as outpatient 3. Essential hypertension- blood pressure stable on usual medications 4. Hyperlipidemia unspecified on pravastatin 5. BPH without urinary obstruction- change combination of Terazosin and Flomax 6. Hypothyroidism unspecified- continue levothyroxine 7. Psychiatry managing the psychiatric issues.  Call with any questions as needed will sign off on this  patient   Code Status:     Code Status Orders        Start     Ordered   12/26/14 2308  Full code   Continuous     12/26/14 2307      Disposition Plan: As per psychiatry  Time spent: 35 minutes  Dayanara Sherrill, [facility]  [facility]

## 2015-01-12 NOTE — Progress Notes (Signed)
D: patient observed in and out of the room. Patient did not experience any n/v this shift.  Patient irritable.  Patient seen pacing the nursing station yelling and upset about d/c plans.  Patient continues to go back and forth about suicidal and homicidal ideation at all times during the shift.  Doctor alerted.  Patient denies any auditory or visual hallucinations.  Patient not compliant with medications even after several attempts to give them to him.  Patient in no distress at this time.   A: support and encouragement provided.  Medications offered as prescribed.  q 15 min checks completed. R: no signs that patient was receptive of the information that was given.

## 2015-01-12 NOTE — Plan of Care (Signed)
Problem: Ineffective individual coping Goal: LTG: Patient will report a decrease in negative feelings Outcome: Not Progressing Patient continues to state his medications are incorrect for what he needs. He also reported his ACT team member came to see him but was not let in to visit. More clarifying details were not forthcoming. In addition, the patient is nauseated and has vomited twice.  Goal: STG: Patient will remain free from self harm Outcome: Progressing No self harm.

## 2015-01-12 NOTE — Progress Notes (Signed)
[facility] MD Progress Note  01/12/2015 3:07 PM [name]  MRN:  [id number]  Subjective: [name] has been extremely agitated today. He is loud and threatening. He refused to take his lithium telling me that it is killing him. Last night he complained of severe abdominal pain and was vomiting. Medicine consult was called to evaluate his hernia. No problems were found. The patient continues to refuse surgery so no surgical evaluation is in place. Medicine input is greatly appreciated. The patient [facility] or else he will kill someone else or himself.  Principal Problem: Schizoaffective disorder, bipolar type ([place]) Diagnosis:   Patient Active Problem List   Diagnosis Date Noted  . Scrotal pain [N50.82] 01/11/2015  . Tobacco use disorder [F17.200] 12/27/2014  . HIV positive ([place]) [Z21] 12/26/2014  . Hypertension [I10] 12/26/2014  . Hypothyroidism [E03.9] 12/26/2014  . Dyslipidemia [E78.5] 12/26/2014  . Schizoaffective disorder, bipolar type ([place]) [F25.0] 10/25/2014  . Alcohol abuse [F10.10] 10/25/2014   Total Time spent with patient: 20 minutes  Past Psychiatric History: Long history of schizzoaffective disorder.  Past Medical History:  Past Medical History  Diagnosis Date  . HIV (human immunodeficiency virus infection) ([place])   . BPH (benign prostatic hypertrophy)   . Dyslipidemia   . Hypertension   . Hypothyroidism   . Schizoaffective disorder ([place])    History reviewed. No pertinent past surgical history. Family History:  Family History  Problem Relation Age of Onset  . Hypertension Other    Family Psychiatric  History: None reported. Social History:  History  Alcohol Use  . 1.2 - 1.8 oz/week  . 2-3 Cans of beer per week     History  Drug Use No    Social History   Social History  . Marital Status: Single    Spouse Name: N/A  . Number of Children: N/A  . Years of Education: N/A   Social History Main Topics   . Smoking status: Current Every Day Smoker -- 2.00 packs/day    Types: Cigarettes  . Smokeless tobacco: None  . Alcohol Use: 1.2 - 1.8 oz/week    2-3 Cans of beer per week  . Drug Use: No  . Sexual Activity: Not Asked   Other Topics Concern  . None   Social History Narrative   Additional Social History:                         Sleep: Poor  Appetite:  Good  Current Medications: Current Facility-Administered Medications  Medication Dose Route Frequency Provider Last Rate Last Dose  . acetaminophen (TYLENOL) tablet 650 mg  650 mg Oral Q6H PRN Gonzella Lex, MD   650 mg at 01/12/15 0741  . alum & mag hydroxide-simeth (MAALOX/MYLANTA) 200-200-20 MG/5ML suspension 30 mL  30 mL Oral Q4H PRN Gonzella Lex, MD      . aspirin EC tablet 81 mg  81 mg Oral QAC breakfast Clovis Fredrickson, MD   81 mg at 01/12/15 0742  . benztropine (COGENTIN) tablet 2 mg  2 mg Oral QAC breakfast Clovis Fredrickson, MD   2 mg at 01/12/15 0742  . carvedilol (COREG) tablet 3.125 mg  3.125 mg Oral BID WC Gonzella Lex, MD   3.125 mg at 01/12/15 0742  . cholecalciferol (VITAMIN D) tablet 2,000 Units  2,000 Units Oral QAC breakfast Clovis Fredrickson, MD   2,000 Units at 01/12/15 0741  . divalproex (  DEPAKOTE) DR tablet 500 mg  500 mg Oral QHS Clovis Fredrickson, MD   500 mg at 01/10/15 2134  . efavirenz-emtricitabine-tenofovir (ATRIPLA) 600-200-300 MG per tablet 1 tablet  1 tablet Oral QHS Gonzella Lex, MD   1 tablet at 01/10/15 2134  . fluPHENAZine (PROLIXIN) tablet 5 mg  5 mg Oral QHS Demarrio Menges B Celedonio Sortino, MD   5 mg at 01/11/15 2200  . fluPHENAZine decanoate (PROLIXIN) injection 50 mg  50 mg Intramuscular Q14 Days Clovis Fredrickson, MD   50 mg at 01/01/15 0811  . iohexol (OMNIPAQUE) 240 MG/ML injection 50 mL  50 mL Oral Once PRN Medication Radiologist, MD      . levothyroxine (SYNTHROID, LEVOTHROID) tablet 25 mcg  25 mcg Oral QAC breakfast Gonzella Lex, MD   25 mcg at 01/12/15 0742  .  lithium carbonate (ESKALITH) CR tablet 900 mg  900 mg Oral Once Clovis Fredrickson, MD   900 mg at 01/10/15 2216  . lithium citrate 300 MG/5ML solution 600 mg  600 mg Oral TID Clovis Fredrickson, MD   600 mg at 01/11/15 0807  . magnesium citrate solution 1 Bottle  1 Bottle Oral Once Clovis Fredrickson, MD   1 Bottle at 01/10/15 1432  . magnesium hydroxide (MILK OF MAGNESIA) suspension 30 mL  30 mL Oral Daily PRN Gonzella Lex, MD   30 mL at 01/10/15 0840  . paliperidone (INVEGA SUSTENNA) injection 234 mg  234 mg Intramuscular Q28 days Clovis Fredrickson, MD   234 mg at 01/01/15 0943  . paliperidone (INVEGA) 24 hr tablet 3 mg  3 mg Oral QHS Clovis Fredrickson, MD   3 mg at 01/10/15 2134  . promethazine (PHENERGAN) tablet 25 mg  25 mg Oral Q6H PRN Clovis Fredrickson, MD   25 mg at 01/12/15 0741  . simvastatin (ZOCOR) tablet 20 mg  20 mg Oral q1800 Gonzella Lex, MD   20 mg at 01/10/15 1853  . tamsulosin (FLOMAX) capsule 0.4 mg  0.4 mg Oral QPC supper Gonzella Lex, MD   0.4 mg at 01/10/15 1853  . temazepam (RESTORIL) capsule 30 mg  30 mg Oral QHS PRN Clovis Fredrickson, MD   30 mg at 01/10/15 2226  . terazosin (HYTRIN) capsule 1 mg  1 mg Oral QHS Gonzella Lex, MD   1 mg at 01/10/15 2134    Lab Results:  Results for orders placed or performed during the hospital encounter of 12/26/14 (from the past 48 hour(s))  Basic metabolic panel     Status: Abnormal   Collection Time: 01/11/15  7:06 AM  Result Value Ref Range   Sodium 141 135 - 145 mmol/L   Potassium 4.2 3.5 - 5.1 mmol/L   Chloride 108 101 - 111 mmol/L   CO2 27 22 - 32 mmol/L   Glucose, Bld 89 65 - 99 mg/dL   BUN 10 6 - 20 mg/dL   Creatinine, Ser 0.84 0.61 - 1.24 mg/dL   Calcium 8.6 (L) 8.9 - 10.3 mg/dL   GFR calc non Af Amer >60 >60 mL/min   GFR calc Af Amer >60 >60 mL/min    Comment: (NOTE) The eGFR has been calculated using the CKD EPI equation. This calculation has not been validated in all clinical  situations. eGFR's persistently <60 mL/min signify possible Chronic Kidney Disease.    Anion gap 6 5 - 15    Physical Findings: AIMS:  , ,  ,  ,  CIWA:    COWS:     Musculoskeletal: Strength & Muscle Tone: within normal limits Gait & Station: normal Patient leans: Right  Psychiatric Specialty Exam: Review of Systems  Gastrointestinal: Positive for abdominal pain.  All other systems reviewed and are negative.   Blood pressure 111/75, pulse 99, temperature 97.6 F (36.4 C), temperature source Oral, resp. rate 20, height 6' (1.829 m), weight 79.379 kg (175 lb), SpO2 100 %.Body mass index is 23.73 kg/(m^2).  General Appearance: Casual  Eye Contact::  Good  Speech:  Pressured  Volume:  Increased  Mood:  Angry, Dysphoric and Irritable  Affect:  Labile  Thought Process:  Disorganized  Orientation:  Full (Time, Place, and Person)  Thought Content:  Delusions and Paranoid Ideation  Suicidal Thoughts:  Yes.  without intent/plan  Homicidal Thoughts:  Yes.  without intent/plan  Memory:  Immediate;   Fair Recent;   Fair Remote;   Fair  Judgement:  Poor  Insight:  Lacking  Psychomotor Activity:  Increased  Concentration:  Fair  Recall:  [employer] of Knowledge:Fair  Language: Fair  Akathisia:  No  Handed:  Right  AIMS (if indicated):     Assets:  Communication Skills Desire for Improvement Financial Resources/Insurance  ADL's:  Intact  Cognition: WNL  Sleep:  Number of Hours: 7.75   Treatment Plan Summary: Daily contact with patient to assess and evaluate symptoms and progress in treatment and Medication management   Mr. Belmares is a 54 year old male with long history of schizoaffective disorder admitted for agitated and threatening behavior at the group home in the context of medication noncompliance.  1. Agitation. Mr. Trimarco is loud, agitated and disruptive again.   2. Mood and psychosis. He is maintained on injections of Prolixin and Invega sustenna for psychosis.  Oral doses are still continued with Cogentin due to tremor. He has been offered lithium and depakote for mood stabilization but refuses Lithium. He has a history of high ammonia on Depakote. Li, VPA and Ammonia level are all low. HE is still delusional, paranoid and disorganized in addition to agitation.   3. Hypothyroidism. Continue Synthroid. TSH is normal.  4. HIV. Continue Atripla.  5. Hypertension. Continue antihypertensives. VS well controlled  6. Dyslipidemia. Continue Zocor. Lipid panel is not elevated.  7. BPH. We will continue Flomax and Terazosin.  8. Social. The patient is not allowed to return to his group home. Currently homeless. Unable to care for self without supervision.  9. Insomnia. Sleep improved with Restoril.  10. Constipation. He is on bowel regimen.   11. Inguinal hernia. Medicine input is greatly appreciated.    12. Vomiting. The patient most likely induces vomiting to rid of medications. Pherergan was ordered.    13. Disposition. To be established. He can not be placed as he was refused PASSR. He is on waitlist for [facility].   Rainn Zupko 01/12/2015, 3:07 PM

## 2015-01-12 NOTE — Plan of Care (Signed)
Problem: Ineffective individual coping Goal: STG: Patient will remain free from self harm Outcome: Not Progressing Patient has stated that he wanted to harm himself after he harmed another in group.  Patient continues to pace up and down the hall way cussing and yelling he is going to hurt someone

## 2015-01-12 NOTE — [facility] Group Notes (Signed)
[facility] Group Notes:  (Nursing/MHT/Case Management/Adjunct)  Date:  01/12/2015  Time:  8:58 AM  Type of Therapy:  Goals   Participation Level:  Active  Participation Quality:  Attentive, Drowsy and Sharing  Affect:  Excited  Cognitive:  Alert, Appropriate and Oriented  Insight:  Appropriate  Engagement in Group:  Engaged  Modes of Intervention:  Discussion  Summary of Progress/Problems:  [name] [name] 01/12/2015, 8:58 AM

## 2015-01-13 NOTE — [facility] Group Notes (Signed)
[facility] LCSW Group Therapy  01/13/2015 2:22 PM  Type of Therapy:  Group Therapy  Participation Level:  Did Not Attend  Modes of Intervention:  Discussion, Education, Socialization and Support  Summary of Progress/Problems: Todays topic: Grudges  Patients will be encouraged to discuss their thoughts, feelings, and behaviors as to why one holds on to grudges and reasons why people have grudges. Patients will process the impact of grudges on their daily lives and identify thoughts and feelings related to holding grudges. Patients will identify feelings and thoughts related to what life would look like without grudges.   [name] L Dody Smartt MSW, LCSWA  01/13/2015, 2:22 PM 

## 2015-01-13 NOTE — Progress Notes (Signed)
The patient continues to note anger at [facility] versus being discharged to the streets, which is where he wants to be right now. He is interactive in the milieu with a labile affect and mood. He denies SI, HI, and AVH.

## 2015-01-13 NOTE — [facility] Group Notes (Signed)
[facility] Group Notes:  (Nursing/MHT/Case Management/Adjunct)  Date:  01/13/2015  Time:  8:52 AM  Type of Therapy:  goals   Participation Level:  Did Not Attend  [name] [name] 01/13/2015, 8:52 AM

## 2015-01-13 NOTE — Progress Notes (Signed)
[facility]  01/13/2015 9:04 PM Lavona MoundJaime Igoe  MRN:  [id number]  Subjective: Mr. Genelle Gatherarkerwas busy smearing feces in his room last night. He is tired and more subdued today. He still complains of problems with hernia. He was evaluated by medicine consultant. He still refuses Lithium.  Principal Problem: Schizoaffective disorder, bipolar type ([facility]) Diagnosis:   Patient Active Problem List   Diagnosis Date Noted  . Scrotal pain [N50.82] 01/11/2015  . Tobacco use disorder [F17.200] 12/27/2014  . HIV positive ([facility]) [Z21] 12/26/2014  . Hypertension [I10] 12/26/2014  . Hypothyroidism [E03.9] 12/26/2014  . Dyslipidemia [E78.5] 12/26/2014  . Schizoaffective disorder, bipolar type ([facility]) [F25.0] 10/25/2014  . Alcohol abuse [F10.10] 10/25/2014   Total Time spent with patient: 20 minutes  Past Psychiatric History: long history of schizoaffective disrder.  Past Medical History:  Past Medical History  Diagnosis Date  . HIV (human immunodeficiency virus infection) ([facility])   . BPH (benign prostatic hypertrophy)   . Dyslipidemia   . Hypertension   . Hypothyroidism   . Schizoaffective disorder ([facility])    History reviewed. No pertinent past surgical history. Family History:  Family History  Problem Relation Age of Onset  . Hypertension Other    Family Psychiatric  History: None reported. Social History:  History  Alcohol Use  . 1.2 - 1.8 oz/week  . 2-3 Cans of beer per week     History  Drug Use No    Social History   Social History  . Marital Status: Single    Spouse Name: N/A  . Number of Children: N/A  . Years of Education: N/A   Social History Main Topics  . Smoking status: Current Every Day Smoker -- 2.00 packs/day    Types: Cigarettes  . Smokeless tobacco: None  . Alcohol Use: 1.2 - 1.8 oz/week    2-3 Cans of beer per week  . Drug Use: No  . Sexual Activity: Not Asked   Other Topics Concern  . None   Social History Narrative   Additional Social History:                          Sleep: Poor  Appetite:  Fair  Current Medications: Current Facility-Administered Medications  Medication Dose Route Frequency Provider Last Rate Last Dose  . acetaminophen (TYLENOL) tablet 650 mg  650 mg Oral Q6H PRN Audery AmelJohn T Clapacs, MD   650 mg at 01/13/15 1718  . alum & mag hydroxide-simeth (MAALOX/MYLANTA) 200-200-20 MG/5ML suspension 30 mL  30 mL Oral Q4H PRN Audery AmelJohn T Clapacs, MD      . aspirin EC tablet 81 mg  81 mg Oral QAC breakfast Summer Mccolgan B Keishon Chavarin, MD   81 mg at 01/13/15 0847  . benztropine (COGENTIN) tablet 2 mg  2 mg Oral QAC breakfast Shari ProwsJolanta B Admiral Marcucci, MD   2 mg at 01/13/15 0846  . carvedilol (COREG) tablet 3.125 mg  3.125 mg Oral BID WC Audery AmelJohn T Clapacs, MD   3.125 mg at 01/13/15 1646  . cholecalciferol (VITAMIN D) tablet 2,000 Units  2,000 Units Oral QAC breakfast Shari ProwsJolanta B Zadiel Leyh, MD   2,000 Units at 01/13/15 0849  . divalproex (DEPAKOTE) DR tablet 500 mg  500 mg Oral QHS Shari ProwsJolanta B Tyran Huser, MD   500 mg at 01/10/15 2134  . efavirenz-emtricitabine-tenofovir (ATRIPLA) 600-200-300 MG per tablet 1 tablet  1 tablet Oral QHS Audery AmelJohn T Clapacs, MD   1 tablet at 01/10/15 2134  . fluPHENAZine (PROLIXIN) tablet  5 mg  5 mg Oral QHS Trinty Marken B Kimball Appleby, MD   5 mg at 01/11/15 2200  . fluPHENAZine decanoate (PROLIXIN) injection 50 mg  50 mg Intramuscular Q14 Days Shari Prows, MD   50 mg at 01/01/15 0811  . iohexol (OMNIPAQUE) 240 MG/ML injection 50 mL  50 mL Oral Once PRN Medication Radiologist, MD      . levothyroxine (SYNTHROID, LEVOTHROID) tablet 25 mcg  25 mcg Oral QAC breakfast Audery Amel, MD   25 mcg at 01/13/15 0848  . lithium carbonate (ESKALITH) CR tablet 900 mg  900 mg Oral Once Shari Prows, MD   900 mg at 01/10/15 2216  . lithium citrate 300 MG/5ML solution 600 mg  600 mg Oral TID Shari Prows, MD   600 mg at 01/13/15 0850  . magnesium citrate solution 1 Bottle  1 Bottle Oral Once Shari Prows, MD   1  Bottle at 01/10/15 1432  . magnesium hydroxide (MILK OF MAGNESIA) suspension 30 mL  30 mL Oral Daily PRN Audery Amel, MD   30 mL at 01/13/15 0116  . paliperidone (INVEGA SUSTENNA) injection 234 mg  234 mg Intramuscular Q28 days Shari Prows, MD   234 mg at 01/01/15 0943  . paliperidone (INVEGA) 24 hr tablet 3 mg  3 mg Oral QHS Shari Prows, MD   3 mg at 01/10/15 2134  . promethazine (PHENERGAN) tablet 25 mg  25 mg Oral Q6H PRN Shari Prows, MD   25 mg at 01/12/15 0741  . simvastatin (ZOCOR) tablet 20 mg  20 mg Oral q1800 Audery Amel, MD   20 mg at 01/13/15 1645  . tamsulosin (FLOMAX) capsule 0.4 mg  0.4 mg Oral QPC supper Audery Amel, MD   0.4 mg at 01/13/15 1645  . temazepam (RESTORIL) capsule 30 mg  30 mg Oral QHS PRN Shari Prows, MD   30 mg at 01/12/15 2143  . terazosin (HYTRIN) capsule 1 mg  1 mg Oral QHS Audery Amel, MD   1 mg at 01/10/15 2134    Lab Results: No results found for this or any previous visit (from the past 48 hour(s)).  Physical Findings: AIMS:  , ,  ,  ,    CIWA:    COWS:     Musculoskeletal: Strength & Muscle Tone: within normal limits Gait & Station: normal Patient leans: N/A  Psychiatric Specialty Exam: Review of Systems  Gastrointestinal: Positive for abdominal pain.  All other systems reviewed and are negative.   Blood pressure 144/81, pulse 87, temperature 98.2 F (36.8 C), temperature source Oral, resp. rate 20, height 6' (1.829 m), weight 79.379 kg (175 lb), SpO2 100 %.Body mass index is 23.73 kg/(m^2).  General Appearance: Casual  Eye Contact::  Good  Speech:  Slurred  Volume:  Increased  Mood:  Angry, Dysphoric and Irritable  Affect:  Labile  Thought Process:  Linear  Orientation:  Full (Time, Place, and Person)  Thought Content:  Delusions and Paranoid Ideation  Suicidal Thoughts:  No  Homicidal Thoughts:  No  Memory:  Immediate;   Fair Recent;   Fair Remote;   Fair  Judgement:  Poor  Insight:   Lacking  Psychomotor Activity:  Increased  Concentration:  Fair  Recall:  [employer] of Knowledge:Fair  Language: Fair  Akathisia:  No  Handed:  Right  AIMS (if indicated):     Assets:  Communication Skills Desire for Improvement Financial Resources/Insurance  Physical Health Resilience  ADL's:  Intact  Cognition: WNL  Sleep:  Number of Hours: 6.25   Treatment Plan Summary: Daily contact with patient to assess and evaluate symptoms and progress in treatment and Medication management   Mr. Goldwater is aKotars[age] year old male with long history of schizoaffective disorder admitted for agitated and threatening behavior at the group home in the context of medication noncompliance.  1. Agitation. Mr. Lua is loud, agitated and disruptive again.   2. Mood and psychosis. He is maintained on injections of Prolixin and Invega sustenna for psychosis. Oral doses are still continued with Cogentin due to tremor. He has been offered lithium and depakote for mood stabilization but refuses Lithium. He has a history of high ammonia on Depakote. Li, VPA and Ammonia level are all low. HE is still delusional, paranoid and disorganized in addition to agitation.   3. Hypothyroidism. Continue Synthroid. TSH is normal.  4. HIV. Continue Atripla.  5. Hypertension. Continue antihypertensives. VS well controlled  6. Dyslipidemia. Continue Zocor. Lipid panel is not elevated.  7. BPH. We will continue Flomax and Terazosin.  8. Social. The patient is not allowed to return to his group home. Currently homeless. Unable to care for self without supervision.  9. Insomnia. Sleep improved with Restoril.  10. Constipation. He is on bowel regimen.   11. Inguinal hernia. Medicine input is greatly appreciated.   12. Vomiting. The patient most likely induces vomiting to rid of medications. Pherergan was ordered.   13. Disposition. To be established. He can not be placed as he was refused PASSR. He is on waitlist  for [facility].   No mdication adjustments were made. The patient still very psychotic and manic but refuses necessary medications.   Lundon Verdejo 01/13/2015, 9:04 PM

## 2015-01-13 NOTE — Progress Notes (Signed)
Pt continues to be easily agitated and loud. Lots of redirecting needed. Pt c/o being nauseated and vomiting moderate to large amts of liquid. Pt's mood and affect has been depressed. Pt denies Si and A/V hallucinations. Pt is med compliant with lots of encouragements. Pt refused lunch,stating " I don't feel good".

## 2015-01-13 NOTE — Plan of Care (Signed)
Problem: Spiritual Needs Goal: Ability to function at adequate level Outcome: Progressing Patient is able to function at an appropriate level, but sometimes notes the choice to act otherwise, example wetting the bed.   Problem: Ineffective individual coping Goal: LTG: Patient will report a decrease in negative feelings Outcome: Not Progressing Patient continues to be angry about [facility] being discharged to the homeless shelter, which he states is what he wants.  Goal: STG: Pt will be able to identify effective and ineffective STG: Pt will be able to identify effective and ineffective coping patterns  Outcome: Progressing Patient states understanding of his need to take medications while he is inpatient, but notes he will not take them upon discharge.

## 2015-01-13 NOTE — Progress Notes (Signed)
Patient continues to be loud with an agitated affective. When asked to lower his voice during a conversation he began to yell louder. He refused all evening medications with the exception of his "pills for sleep and pain". He was given Tylenol and Remeron. Later he came to the nurse's station noting he felt like he needed to have a bowel movement, but was unable. He requested MOM and it was given. Immediately after the medication was administered he went to his restroom, bent over and sprayed the walls with feces. Afterwards he went back to bed and slept.

## 2015-01-14 MED ORDER — DIVALPROEX SODIUM 500 MG PO DR TAB
1000.0000 mg | DELAYED_RELEASE_TABLET | Freq: Every day | ORAL | Status: DC
  Administered 2015-01-14: 1000 mg via ORAL
  Administered 2015-01-16: 500 mg via ORAL
  Administered 2015-01-17: 1000 mg via ORAL
  Filled 2015-01-14 (×3): qty 2

## 2015-01-14 MED ORDER — LITHIUM CARBONATE 300 MG PO CAPS
300.0000 mg | ORAL_CAPSULE | Freq: Three times a day (TID) | ORAL | Status: DC
  Administered 2015-01-14 – 2015-01-28 (×36): 300 mg via ORAL
  Filled 2015-01-14 (×38): qty 1

## 2015-01-14 MED ORDER — TRAZODONE HCL 50 MG PO TABS
150.0000 mg | ORAL_TABLET | Freq: Every day | ORAL | Status: DC
  Administered 2015-01-14: 150 mg via ORAL
  Filled 2015-01-14: qty 1

## 2015-01-14 MED ORDER — DESMOPRESSIN ACETATE 0.2 MG PO TABS
0.2000 mg | ORAL_TABLET | Freq: Every day | ORAL | Status: DC
  Administered 2015-01-16 – 2015-01-30 (×15): 0.2 mg via ORAL
  Filled 2015-01-14 (×18): qty 1

## 2015-01-14 NOTE — Progress Notes (Signed)
Patient more cooperative tonight than in past days. Agreed to take all meds with the exception of liquid Lithium. He continues to note he is ready to leave and wants to be discharged to the street. Periods of loud talk in the milieu, but overal improved from past few days. Agrees to adequate self care for overnight hours. Denies any needs, SI, HI, or AVH.

## 2015-01-14 NOTE — Progress Notes (Signed)
He stayed in room most of the shift.Took all morning medications except liquid lithium.Took lithium tablet this evening.States "you all are killing me with medicines.My problem is my hernia."Most of the time he was cooperative & less intrusive today.

## 2015-01-14 NOTE — Plan of Care (Signed)
Problem: Alteration in mood Goal: LTG-Patient reports reduction in suicidal thoughts (Patient reports reduction in suicidal thoughts and is able to verbalize a safety plan for whenever patient is feeling suicidal)  Outcome: Progressing Denies suicidal ideation.     

## 2015-01-14 NOTE — [facility] Group Notes (Signed)
[facility] Group Notes:  (Nursing/MHT/Case Management/Adjunct)  Date:  01/14/2015  Time:  12:50 PM  Type of Therapy:  Psychoeducational Skills  Participation Level:  Did Not Attend   Darrow BussingMarly S Kesia Dalto 01/14/2015, 12:50 PM

## 2015-01-14 NOTE — Progress Notes (Signed)
[facility] MD Progress Note  01/14/2015 2:53 PM Matther Labell  MRN:  [id number]  Subjective:  [name] remains loud, agitated, intrusive, hard to redirect. He agrees to take lithium now only in tablet form but not liquid. Liquid was started when his lithium level remained low in spite of dose increase. He reports poor sleep with bedwetting. He is preoccupied with his belongings that were left behind at the group home where he is not returning. According to social worker his act team. His belongings and they are safely stored. Teeth in poor treatment compliance it is very likely that the patient will be transferred to [facility] especially that we will refused past several which makes placement impossible.  Principal Problem: Schizoaffective disorder, bipolar type (HCC) Diagnosis:   Patient Active Problem List   Diagnosis Date Noted  . Scrotal pain [N50.82] 01/11/2015  . Tobacco use disorder [F17.200] 12/27/2014  . HIV positive (HCC) [Z21] 12/26/2014  . Hypertension [I10] 12/26/2014  . Hypothyroidism [E03.9] 12/26/2014  . Dyslipidemia [E78.5] 12/26/2014  . Schizoaffective disorder, bipolar type (HCC) [F25.0] 10/25/2014  . Alcohol abuse [F10.10] 10/25/2014   Total Time spent with patient: 20 minutes  Past Psychiatric History: history of schizoaffective disorder.   Medical History:  Past Medical History  Diagnosis Date  . HIV (human immunodeficiency virus infection) (HCC)   . BPH (benign prostatic hypertrophy)   . Dyslipidemia   . Hypertension   . Hypothyroidism   . Schizoaffective disorder (HCC)    History reviewed. No pertinent past surgical history. Family History:  Family History  Problem Relation Age of Onset  . Hypertension Other    Family Psychiatric  none reported.  Social History:  History  Alcohol Use  . 1.2 - 1.8 oz/week  . 2-3 Cans of beer per week     History  Drug Use No    Social History   Social History  . Marital Status: Single    Spouse  Name: N/A  . Number of Children: N/A  . Years of Education: N/A   Social History Main Topics  . Smoking status: Current Every Day Smoker -- 2.00 packs/day    Types: Cigarettes  . Smokeless tobacco: None  . Alcohol Use: 1.2 - 1.8 oz/week    2-3 Cans of beer per week  . Drug Use: No  . Sexual Activity: Not Asked   Other Topics Concern  . None   Social History Narrative   Additional Social History:                         Sleep: Poor  Appetite:  Fair  Current Medications: Current Facility-Administered Medications  Medication Dose Route Frequency Provider Last Rate Last Dose  . acetaminophen (TYLENOL) tablet 650 mg  650 mg Oral Q6H PRN Audery Amel, MD   650 mg at 01/13/15 1718  . alum & mag hydroxide-simeth (MAALOX/MYLANTA) 200-200-20 MG/5ML suspension 30 mL  30 mL Oral Q4H PRN Audery Amel, MD      . aspirin EC tablet 81 mg  81 mg Oral QAC breakfast Shari Prows, MD   81 mg at 01/14/15 0925  . benztropine (COGENTIN) tablet 2 mg  2 mg Oral QAC breakfast Shari Prows, MD   2 mg at 01/14/15 0923  . carvedilol (COREG) tablet 3.125 mg  3.125 mg Oral BID WC Audery Amel, MD   3.125 mg at 01/14/15 0925  . cholecalciferol (VITAMIN D) tablet 2,000 Units  2,000 Units Oral QAC breakfast Shari ProwsJolanta B Pucilowska, MD   2,000 Units at 01/14/15 0924  . divalproex (DEPAKOTE) DR tablet 500 mg  500 mg Oral QHS Shari ProwsJolanta B Pucilowska, MD   500 mg at 01/13/15 2107  . efavirenz-emtricitabine-tenofovir (ATRIPLA) 600-200-300 MG per tablet 1 tablet  1 tablet Oral QHS Audery AmelJohn T Clapacs, MD   1 tablet at 01/13/15 2107  . fluPHENAZine (PROLIXIN) tablet 5 mg  5 mg Oral QHS Shari ProwsJolanta B Pucilowska, MD   5 mg at 01/13/15 2107  . fluPHENAZine decanoate (PROLIXIN) injection 50 mg  50 mg Intramuscular Q14 Days Shari ProwsJolanta B Pucilowska, MD   50 mg at 01/01/15 0811  . iohexol (OMNIPAQUE) 240 MG/ML injection 50 mL  50 mL Oral Once PRN Medication Radiologist, MD      . levothyroxine (SYNTHROID,  LEVOTHROID) tablet 25 mcg  25 mcg Oral QAC breakfast Audery AmelJohn T Clapacs, MD   25 mcg at 01/14/15 [id number]  . lithium carbonate (ESKALITH) CR tablet 900 mg  900 mg Oral Once Shari ProwsJolanta B Pucilowska, MD   900 mg at 01/10/15 2216  . lithium citrate 300 MG/5ML solution 600 mg  600 mg Oral TID Shari ProwsJolanta B Pucilowska, MD   600 mg at 01/13/15 0850  . magnesium citrate solution 1 Bottle  1 Bottle Oral Once Shari ProwsJolanta B Pucilowska, MD   1 Bottle at 01/10/15 1432  . magnesium hydroxide (MILK OF MAGNESIA) suspension 30 mL  30 mL Oral Daily PRN Audery AmelJohn T Clapacs, MD   30 mL at 01/13/15 0116  . paliperidone (INVEGA SUSTENNA) injection 234 mg  234 mg Intramuscular Q28 days Shari ProwsJolanta B Pucilowska, MD   234 mg at 01/01/15 0943  . paliperidone (INVEGA) 24 hr tablet 3 mg  3 mg Oral QHS Shari ProwsJolanta B Pucilowska, MD   3 mg at 01/13/15 2107  . promethazine (PHENERGAN) tablet 25 mg  25 mg Oral Q6H PRN Shari ProwsJolanta B Pucilowska, MD   25 mg at 01/12/15 0741  . simvastatin (ZOCOR) tablet 20 mg  20 mg Oral q1800 Audery AmelJohn T Clapacs, MD   20 mg at 01/13/15 1645  . tamsulosin (FLOMAX) capsule 0.4 mg  0.4 mg Oral QPC supper Audery AmelJohn T Clapacs, MD   0.4 mg at 01/13/15 1645  . temazepam (RESTORIL) capsule 30 mg  30 mg Oral QHS PRN Shari ProwsJolanta B Pucilowska, MD   30 mg at 01/12/15 2143  . terazosin (HYTRIN) capsule 1 mg  1 mg Oral QHS Audery AmelJohn T Clapacs, MD   1 mg at 01/13/15 2107    Lab Results: No results found for this or any previous visit (from the past 48 hour(s)).  Physical Findings: AIMS:  , ,  ,  ,    CIWA:    COWS:     Musculoskeletal: Strength & Muscle Tone: within normal limits Gait & Station: normal Patient leans: N/A  Psychiatric Specialty Exam: Review of Systems  All other systems reviewed and are negative.   Blood pressure 110/75, pulse 89, temperature 98.4 F (36.9 C), temperature source Oral, resp. rate 20, height 6' (1.829 m), weight 79.379 kg (175 lb), SpO2 100 %.Body mass index is 23.73 kg/(m^2).  General Appearance: Casual  Eye Contact::   Good  Speech:  Pressured  Volume:  Increased  Mood:  Angry, Dysphoric and Irritable  Affect:  Labile  Thought Process:  Goal Directed  Orientation:  Full (Time, Place, and Person)  Thought Content:  Delusions and Paranoid Ideation  Suicidal Thoughts:  No  Homicidal Thoughts:  No  Memory:  Immediate;   Fair Recent;   Fair Remote;   Fair  Judgement:  Impaired  Insight:  Shallow  Psychomotor Activity:  Increased  Concentration:  Fair  Recall:  [employer] of Knowledge:Fair  Language: Fair  Akathisia:  No  Handed:  Right  AIMS (if indicated):     Assets:  Communication Skills Desire for Improvement Financial Resources/Insurance Physical Health Resilience  ADL's:  Intact  Cognition: WNL  Sleep:  Number of Hours: 4.75   Treatment Plan Summary: Daily contact with patient to assess and evaluate symptoms and progress in treatment and Medication management   Mr. Depaulo is a 54 year old male with long history of schizoaffective disorder admitted for agitated and threatening behavior at the group home in the context of medication noncompliance.  1. Agitation. Mr. Schoon is loud, agitated and disruptive again.   2. Mood and psychosis. He is maintained on injections of Prolixin and Invega sustenna for psychosis. Oral doses are still continued with Cogentin due to tremor. We continue Depakote and increase dose to 1000 mg at night. We will offer Lithium tablets with compliance monitoring. He has a history of high ammonia on Depakote. Li, VPA and Ammonia level are all low. He is still delusional, paranoid and disorganized in addition to agitation.   3. Hypothyroidism. Continue Synthroid. TSH is normal.  4. HIV. Continue Atripla.  5. Hypertension. Continue antihypertensives. VS well controlled  6. Dyslipidemia. Continue Zocor. Lipid panel is not elevated.  7. BPH. We will continue Flomax and Terazosin.  8. Social. The patient is not allowed to return to his group home. Currently  homeless. Unable to care for self without supervision.  9. Insomnia. Sleep improved with Restoril.  10. Constipation. He is on bowel regimen.   11. Inguinal hernia. Medicine input is greatly appreciated.   12. Vomiting. Resolved.    13. Urinary incontinence. Will start DDAVP.   14. Disposition. To be established. He can not be placed as he was refused PASSR. He is on waitlist for [facility].   Jolanta Pucilowska 01/14/2015, 2:53 PM

## 2015-01-14 NOTE — Progress Notes (Signed)
Recreation Therapy Notes  Date: 10.24.16 Time: 3:00 pm Location: Craft Room  Group Topic: Self-expression  Goal Area(s) Addresses:  Patient will effectively use art as a mean's of self-expression. Patient will recognize positive benefit of self-expression. Patient will be able to identify one emotion experienced during group session. Patient will identify use of art/self-expression as a coping skill.  Behavioral Response: Attentive  Intervention: Two Faces of Me  Activity: Patients were given a blank face worksheet and instructed to draw or write how they felt when they were admitted to the hospital on one side and draw or write how they want to feel when they are discharged from the hospital on the other side.  Education: LRT educated patients on other forms of self-expression.  Education Outcome: In group clarification offered  Clinical Observations/Feedback: Patient Completed activity by drawing his face when he was admitted and how he wants his face to look when he is d/c. Patient did not contribute top group discussion.  Jacquelynn CreeGreene,Emilliano Dilworth M, LRT/CTRS 01/14/2015 4:43 PM

## 2015-01-15 MED ORDER — TRAZODONE HCL 100 MG PO TABS
300.0000 mg | ORAL_TABLET | Freq: Every day | ORAL | Status: DC
Start: 2015-01-15 — End: 2015-01-31
  Administered 2015-01-16: 100 mg via ORAL
  Administered 2015-01-17 – 2015-01-25 (×9): 300 mg via ORAL
  Administered 2015-01-26 – 2015-01-27 (×2): 100 mg via ORAL
  Administered 2015-01-28 – 2015-01-30 (×3): 300 mg via ORAL
  Filled 2015-01-15 (×15): qty 3

## 2015-01-15 NOTE — Progress Notes (Signed)
[facility] MD Progress Note  01/15/2015 5:09 PM Keiji Melland  MRN:  [id number]  Subjective:  Mr. Gerrard remains to be loud, intrusive, disruptive to the milieu and difficult to redirect. He took his lithium last night and this morning. He is compliant with other medications. He is still preoccupied with his possessions that are now secure with his ACT team. He met with acting representative. They made clear that the patient is far from his baseline. He has no somatic complaints today although he tells me that lithium is killing him by going into his hernia.   Principal Problem: Schizoaffective disorder, bipolar type ([place]) Diagnosis:   Patient Active Problem List   Diagnosis Date Noted  . Scrotal pain [N50.82] 01/11/2015  . Tobacco use disorder [F17.200] 12/27/2014  . HIV positive ([place]) [Z21] 12/26/2014  . Hypertension [I10] 12/26/2014  . Hypothyroidism [E03.9] 12/26/2014  . Dyslipidemia [E78.5] 12/26/2014  . Schizoaffective disorder, bipolar type ([place]) [F25.0] 10/25/2014  . Alcohol abuse [F10.10] 10/25/2014   Total Time spent with patient: 20 minutes  Past Psychiatric History: Schizoaffective disorder.  Past Medical History:  Past Medical History  Diagnosis Date  . HIV (human immunodeficiency virus infection) ([place])   . BPH (benign prostatic hypertrophy)   . Dyslipidemia   . Hypertension   . Hypothyroidism   . Schizoaffective disorder ([place])    History reviewed. No pertinent past surgical history. Family History:  Family History  Problem Relation Age of Onset  . Hypertension Other    Family Psychiatric  History: None reported. Social History:  History  Alcohol Use  . 1.2 - 1.8 oz/week  . 2-3 Cans of beer per week     History  Drug Use No    Social History   Social History  . Marital Status: Single    Spouse Name: N/A  . Number of Children: N/A  . Years of Education: N/A   Social History Main Topics  . Smoking status: Current Every Day Smoker -- 2.00 packs/day     Types: Cigarettes  . Smokeless tobacco: None  . Alcohol Use: 1.2 - 1.8 oz/week    2-3 Cans of beer per week  . Drug Use: No  . Sexual Activity: Not Asked   Other Topics Concern  . None   Social History Narrative   Additional Social History:                         Sleep: Fair  Appetite:  Fair  Current Medications: Current Facility-Administered Medications  Medication Dose Route Frequency Provider Last Rate Last Dose  . acetaminophen (TYLENOL) tablet 650 mg  650 mg Oral Q6H PRN Gonzella Lex, MD   650 mg at 01/15/15 1246  . alum & mag hydroxide-simeth (MAALOX/MYLANTA) 200-200-20 MG/5ML suspension 30 mL  30 mL Oral Q4H PRN Gonzella Lex, MD      . aspirin EC tablet 81 mg  81 mg Oral QAC breakfast Howard Patton B Maydell Knoebel, MD   81 mg at 01/15/15 0841  . benztropine (COGENTIN) tablet 2 mg  2 mg Oral QAC breakfast Clovis Fredrickson, MD   2 mg at 01/15/15 0840  . carvedilol (COREG) tablet 3.125 mg  3.125 mg Oral BID WC Gonzella Lex, MD   3.125 mg at 01/15/15 0839  . cholecalciferol (VITAMIN D) tablet 2,000 Units  2,000 Units Oral QAC breakfast Clovis Fredrickson, MD   2,000 Units at 01/15/15 0840  . desmopressin (DDAVP) tablet 0.2 mg  0.2  mg Oral QHS Clovis Fredrickson, MD   0.2 mg at 01/14/15 2055  . divalproex (DEPAKOTE) DR tablet 1,000 mg  1,000 mg Oral QHS Clovis Fredrickson, MD   1,000 mg at 01/14/15 2048  . efavirenz-emtricitabine-tenofovir (ATRIPLA) 600-200-300 MG per tablet 1 tablet  1 tablet Oral QHS Gonzella Lex, MD   1 tablet at 01/14/15 2048  . fluPHENAZine (PROLIXIN) tablet 5 mg  5 mg Oral QHS Clovis Fredrickson, MD   5 mg at 01/14/15 2049  . fluPHENAZine decanoate (PROLIXIN) injection 50 mg  50 mg Intramuscular Q14 Days Clovis Fredrickson, MD   50 mg at 01/15/15 0907  . iohexol (OMNIPAQUE) 240 MG/ML injection 50 mL  50 mL Oral Once PRN Medication Radiologist, MD      . levothyroxine (SYNTHROID, LEVOTHROID) tablet 25 mcg  25 mcg Oral QAC breakfast  Gonzella Lex, MD   25 mcg at 01/14/15 3888  . lithium carbonate capsule 300 mg  300 mg Oral TID WC Sherry Blackard B Desarai Barrack, MD   300 mg at 01/15/15 1234  . magnesium citrate solution 1 Bottle  1 Bottle Oral Once Clovis Fredrickson, MD   1 Bottle at 01/10/15 1432  . magnesium hydroxide (MILK OF MAGNESIA) suspension 30 mL  30 mL Oral Daily PRN Gonzella Lex, MD   30 mL at 01/15/15 1333  . paliperidone (INVEGA SUSTENNA) injection 234 mg  234 mg Intramuscular Q28 days Clovis Fredrickson, MD   234 mg at 01/01/15 0943  . paliperidone (INVEGA) 24 hr tablet 3 mg  3 mg Oral QHS Clovis Fredrickson, MD   3 mg at 01/14/15 2049  . promethazine (PHENERGAN) tablet 25 mg  25 mg Oral Q6H PRN Clovis Fredrickson, MD   25 mg at 01/12/15 0741  . simvastatin (ZOCOR) tablet 20 mg  20 mg Oral q1800 Gonzella Lex, MD   20 mg at 01/14/15 1712  . tamsulosin (FLOMAX) capsule 0.4 mg  0.4 mg Oral QPC supper Gonzella Lex, MD   0.4 mg at 01/14/15 1712  . temazepam (RESTORIL) capsule 30 mg  30 mg Oral QHS PRN Clovis Fredrickson, MD   30 mg at 01/12/15 2143  . terazosin (HYTRIN) capsule 1 mg  1 mg Oral QHS Gonzella Lex, MD   1 mg at 01/14/15 2049  . traZODone (DESYREL) tablet 150 mg  150 mg Oral QHS Clovis Fredrickson, MD   150 mg at 01/14/15 2049    Lab Results: No results found for this or any previous visit (from the past 48 hour(s)).  Physical Findings: AIMS:  , ,  ,  ,    CIWA:    COWS:     Musculoskeletal: Strength & Muscle Tone: within normal limits Gait & Station: normal Patient leans: N/A  Psychiatric Specialty Exam: Review of Systems  Psychiatric/Behavioral: Positive for hallucinations. The patient is nervous/anxious.   All other systems reviewed and are negative.   Blood pressure 110/75, pulse 89, temperature 98.4 F (36.9 C), temperature source Oral, resp. rate 20, height 6' (1.829 m), weight 79.379 kg (175 lb), SpO2 100 %.Body mass index is 23.73 kg/(m^2).  General Appearance: Casual   Eye Contact::  Fair  Speech:  Pressured  Volume:  Normal  Mood:  Angry  Affect:  Labile  Thought Process:  Disorganized  Orientation:  Full (Time, Place, and Person)  Thought Content:  Delusions and Paranoid Ideation  Suicidal Thoughts:  No  Homicidal Thoughts:  No  Memory:  Immediate;   Fair Recent;   Fair Remote;   Fair  Judgement:  Impaired  Insight:  Lacking  Psychomotor Activity:  Increased  Concentration:  Fair  Recall:  [employer] of Knowledge:Fair  Language: Fair  Akathisia:  No  Handed:  Right  AIMS (if indicated):     Assets:  Communication Skills Desire for Improvement Financial Resources/Insurance Physical Health Resilience  ADL's:  Intact  Cognition: WNL  Sleep:  Number of Hours: 4   Treatment Plan Summary: Daily contact with patient to assess and evaluate symptoms and progress in treatment and Medication management   Mr. Hewitt is a 54 year old male with long history of schizoaffective disorder admitted for agitated and threatening behavior at the group home in the context of medication noncompliance.  1. Agitation. Mr. Cothern is loud, agitated and disruptive again.   2. Mood and psychosis. He is maintained on injections of Prolixin and Invega sustenna for psychosis. Oral doses are still continued with Cogentin due to tremor. We continue Depakote and increase dose to 1000 mg at night. We will offer Lithium tablets with compliance monitoring. He has a history of high ammonia on Depakote. Li, VPA and Ammonia level are all low. He is still delusional, paranoid and disorganized in addition to agitation.   3. Hypothyroidism. Continue Synthroid. TSH is normal.  4. HIV. Continue Atripla.  5. Hypertension. Continue antihypertensives. VS well controlled  6. Dyslipidemia. Continue Zocor. Lipid panel is not elevated.  7. BPH. We will continue Flomax and Terazosin.  8. Social. The patient is not allowed to return to his group home. Currently homeless. Unable to  care for self without supervision.  9. Insomnia. Sleep is still a problem in spite of Restoril. He slept 4 hours only. Will increase trazodone to 300 mg.   10. Constipation. He is on bowel regimen.   11. Inguinal hernia. Medicine input is greatly appreciated.   12. Vomiting. Resolved.   13. Urinary incontinence. Will start DDAVP.   14. Disposition. To be established. He can not be placed as he was refused PASSR. He is on waitlist for [facility].    Hien Cunliffe 01/15/2015, 5:09 PM

## 2015-01-15 NOTE — Progress Notes (Signed)
Recreation Therapy Notes  Date: 10.25.16 Time: 3:00 pm Location: Craft Room  Group Topic: Goal Setting  Goal Area(s) Addresses:  Patient will write at least one goal. Patient will write at least one obstacle.  Behavioral Response: Did not attend  Intervention: Recovery Goal Chart  Activity: Patients were instructed to make a goal chart including goals towards their recovery, obstacles, the date they started working on their goal, and the date they achieved their goal.  Education: LRT educated patients on healthy ways they can celebrate reaching their goals.  Education Outcome: Patient did not attend group.  Clinical Observations/Feedback: Patient did not attend group.  Jacquelynn CreeGreene,Zahmir Lalla M, LRT/CTRS 01/15/2015 4:22 PM

## 2015-01-15 NOTE — Progress Notes (Addendum)
Patient oriented, does not refused medications, has slept most of day , did eat breakfast, skipped lunch, complain of abdomen pain, had tylenol and maalox, patient states He did get some relief. Patient with mood swings, but no distress, no Si,HI, noted. Patient refuses all groups.

## 2015-01-15 NOTE — Progress Notes (Signed)
D: Patient denies SI/HI/AVH. Patient is tangential.  Patient affect is anxious and his mood is labile.  Patient did attend evening group. Patient visible on the milieu. No distress noted. A: Support and encouragement offered. Scheduled medications given to pt. Q 15 min checks continued for patient safety. R: Patient receptive. Patient remains safe on the unit.

## 2015-01-16 MED ORDER — IBUPROFEN 600 MG PO TABS
600.0000 mg | ORAL_TABLET | Freq: Four times a day (QID) | ORAL | Status: DC | PRN
  Administered 2015-01-16: 600 mg via ORAL
  Filled 2015-01-16: qty 1

## 2015-01-16 NOTE — [facility] Group Notes (Signed)
[facility] Group Notes:  (Nursing/MHT/Case Management/Adjunct)  Date:  01/16/2015  Time:  1:56 PM  Type of Therapy:  Psychoeducational Skills  Participation Level:  Active  Participation Quality:  Appropriate, Attentive and Sharing  Affect:  Appropriate  Cognitive:  Alert and Appropriate  Insight:  Appropriate and Good  Engagement in Group:  Engaged  Modes of Intervention:  Discussion, Education and Support  Summary of Progress/Problems:  Lynelle SmokeCara Travis Manon [place] 01/16/2015, 1:56 PM

## 2015-01-16 NOTE — Progress Notes (Signed)
Recreation Therapy Notes  Date: 10.26.16 Time: 3:00 pm Location: Craft Room  Group Topic: Self-esteem  Goal Area(s) Addresses:  Patient will write at least one positive trait. Patient will verbalize benefit of having a healthy self-esteem.  Behavioral Response: Attentive, Interactive  Intervention: I Am  Activity: Patients were given a worksheet with the letter I on it and instructed to fill the letter with as many positive traits about themselves as they could.  Education:LRT educated patients on ways they can increase their self-esteem.   Education Outcome: In group clarification offered  Clinical Observations/Feedback: Patient completed activity by filling worksheet with positive traits. Patient contributed to group discussion by stating it was easy for her to think of positive traits and that he wrote some negative ones too. LRT encouraged patient to focus on positive traits.  Jacquelynn CreeGreene,Mettie Roylance M, LRT/CTRS 01/16/2015 4:44 PM

## 2015-01-16 NOTE — Progress Notes (Signed)
Patient ID: [name], male   DOB: [birth year], 54 y.o.   MRN: [id number] PER STATE REGULATIONS 482.30  THIS CHART WAS REVIEWED FOR MEDICAL NECESSITY WITH RESPECT TO THE PATIENT'S ADMISSION/ DURATION OF STAY.  NEXT REVIEW DATE: 01/19/2015  Willa RoughJENNIFER JONES Aundrey Elahi, RN, BSN CASE MANAGER

## 2015-01-16 NOTE — Progress Notes (Signed)
D: Patient denies SI/HI/AVH.   Patient affect and mood are labile.  Patient had an angry outburst at shift change in which he was yelling at staff and other patients.  Patient did attend evening group. Patient visible on the milieu.  A: Support and encouragement offered. Patient refused his scheduled night medication. Q 15 min checks continued for patient safety. R: Patient receptive. Patient remains safe on the unit.

## 2015-01-16 NOTE — [facility] Group Notes (Signed)
[facility]   01/16/2015 11:47 AM  Participation Quality:  Active  Mood/Affect:  Appropriate  Thoughts of Suicide:  NA Will you contract for safety?   NA  Current AVH:  NA  Plan for Discharge/Comments:  Patient attended group and participated appropriately. He stated that his goal is to be discharged. Patient agreed that it could be beneficial to speak with his assigned doctor and CSW to obtain an update.  Transportation Means: Patient was informed about different transportation resources and encouraged to speak to CSW if a referral is needed.  Supports: Patient was encouraged to speak with CSW for community resources, if needed.   Jenel LucksJasmine Lewis, Clinical Social Work Intern 01/16/15  Beryl MeagerJason Rand Boller, MSW,  Theresia MajorsLCSWA  01/16/15

## 2015-01-16 NOTE — Tx Team (Signed)
Interdisciplinary Treatment Plan Update (Adult)  Date:  01/16/2015 Time Reviewed:  4:03 PM  Progress in Treatment: Attending groups: No. Participating in groups:  No. Taking medication as prescribed:  Yes. Tolerating medication:  Yes. Family/Significant othe contact made:  Yes, individual(s) contacted:  ACTT team Patient understands diagnosis:  Yes. Discussing patient identified problems/goals with staff:  Yes. Medical problems stabilized or resolved:  Yes. and As evidenced by:  Pt declined hernia repair, so nothing else can be done to address complaints of abdominal pain Denies suicidal/homicidal ideation: Yes. Issues/concerns per patient self-inventory:  No. Other:  New problem(s) identified: No, Describe:     Discharge Plan or Barriers:  Pt remains on [facility] list.  Difficult to place, [facility]RR has refused to authorize for ALF placement. Has [employer] ACTT support whenever discharged.  Reason for Continuation of Hospitalization: Mania Medication stabilization Other; describe labile mood  Comments: [name] is slightly better. He is not as loud, agitated, or intrusive as yesterday. For the past 2 days he has been accepting lithium and Depakote. He slept 12 hours last night for the first time. He still believes that he should be discharged to the streets to fend for himself. He does agree that he will be back in 2 days. The patient does much better in structured environment. He is still writing copious letters. He gave me 1 address to his Medicaid caseworker. In the letter he says that he would agree to have his hernia repaired but worries that he will not be able to "make love" anymore. He also talks about his plan to write a bicycle to [place] and to be the first man to do so. He has been complaining of abdominal pain that he associates with hernia and has been prescribed Tylenol. About your concerns about her" she wants all rightS. He was evaluated by medicine consultant. As long as the  patient declined surgery nothing more can be done  Estimated length of stay: up to 7 days  New goal(s):  Review of initial/current patient goals per problem list:   See Plan of Care  Attendees: Patient:  [name] 10/26/20164:03 PM  Family:   10/26/20164:03 PM  Physician:  Kristine LineaJolanta Pucilowska 10/26/20164:03 PM  Nursing:    10/26/20164:03 PM  Case Manager:   10/26/20164:03 PM  Counselor:  Jake SharkSara Tiffini Blacksher, LCSW 10/26/20164:03 PM  Other:  Hershal CoriaBeth Greene LRT 10/26/20164:03 PM  Other:   10/26/20164:03 PM  Other:   10/26/20164:03 PM  Other:  10/26/20164:03 PM  Other:  10/26/20164:03 PM  Other:  10/26/20164:03 PM  Other:  10/26/20164:03 PM  Other:  10/26/20164:03 PM  Other:  10/26/20164:03 PM  Other:   10/26/20164:03 PM   Scribe for Treatment Team:   Glennon MacLaws, Peg Fifer P, 01/16/2015, 4:03 PM, MSW< LCSW

## 2015-01-16 NOTE — [facility] Group Notes (Signed)
[facility]  01/16/2015 2:26 PM  Type of Therapy:  Group Therapy  Participation Level:  Did Not Attend  Lulu Ridingngle, Lyndee Herbst T, MSW, LCSWA 01/16/2015, 2:26 PM

## 2015-01-16 NOTE — [facility] Group Notes (Signed)
[facility] Group Notes:  (Nursing/MHT/Case Management/Adjunct)  Date:  01/16/2015  Time:  10:16 PM  Type of Therapy:  Group Therapy  Participation Level:  Active  Participation Quality:  Appropriate  Affect:  Appropriate  Cognitive:  Appropriate  Insight:  Appropriate  Engagement in Group:  Improving  Modes of Intervention:  Discussion  Summary of Progress/Problems:  Daniel EkJanice Marie Sarann Benitez 01/16/2015, 10:16 PM

## 2015-01-16 NOTE — Progress Notes (Signed)
D: Patient continues to have a labile mood. Voice is loud and angry at times. [facility] remains [facility] and says that the staff there won't keep him there very long, so he will ultimately end up on the streets. He still c/o inguinal hernia pain but refuses treatment for this. Denies SI/HI or hallucinations. Speech and thoughts are disorganized. A: On q 15 minute checks. Given meds; offered emotional support. R: Labile but cooperative.

## 2015-01-16 NOTE — Progress Notes (Signed)
[facility]  01/16/2015 1:23 PM [name]  MRN:  [id number]  Subjective:  [name] is slightly better today. He is not as loud, agitated, or intrusive as yesterday. For the past 2 days he has been accepting lithium and Depakote. He slept 12 hours last night for the first time. He still believes that he should be discharged to the streets to fend for himself. He does agree that he will be back in 2 days. The patient does much better in structured environment. He is still writing copious letters. He gave me 1 address to his Medicaid caseworker. In the letter he says that he would agree to have his hernia repaired but worries that he will not be able to "make love" anymore. He also talks about his plan to write a bicycle to [place] and to be the first man to do so. He has been complaining of abdominal pain that he associates with hernia and has been prescribed Tylenol. About your concerns about her" she wants all rightS. He was evaluated by medicine consultant. As long as the patient declined surgery nothing more can be done.    Principal Problem: Schizoaffective disorder, bipolar type (HCC) Diagnosis:   Patient Active Problem List   Diagnosis Date Noted  . Scrotal pain [N50.82] 01/11/2015  . Tobacco use disorder [F17.200] 12/27/2014  . HIV positive (HCC) [Z21] 12/26/2014  . Hypertension [I10] 12/26/2014  . Hypothyroidism [E03.9] 12/26/2014  . Dyslipidemia [E78.5] 12/26/2014  . Schizoaffective disorder, bipolar type (HCC) [F25.0] 10/25/2014  . Alcohol abuse [F10.10] 10/25/2014   Total Time spent with patient: 20 minutes  Past Psychiatric History: Schizoaffective disorder.   Past Medical History:  Past Medical History  Diagnosis Date  . HIV (human immunodeficiency virus infection) (HCC)   . BPH (benign prostatic hypertrophy)   . Dyslipidemia   . Hypertension   . Hypothyroidism   . Schizoaffective disorder (HCC)    History reviewed. No pertinent past surgical  history. Family History:  Family History  Problem Relation Age of Onset  . Hypertension Other    Family Psychiatric  History: None reported.  Social History:  History  Alcohol Use  . 1.2 - 1.8 oz/week  . 2-3 Cans of beer per week     History  Drug Use No    Social History   Social History  . Marital Status: Single    Spouse Name: N/A  . Number of Children: N/A  . Years of Education: N/A   Social History Main Topics  . Smoking status: Current Every Day Smoker -- 2.00 packs/day    Types: Cigarettes  . Smokeless tobacco: None  . Alcohol Use: 1.2 - 1.8 oz/week    2-3 Cans of beer per week  . Drug Use: No  . Sexual Activity: Not Asked   Other Topics Concern  . None   Social History Narrative   Additional Social History:                         Sleep: Good  Appetite:  Fair  Current Medications: Current Facility-Administered Medications  Medication Dose Route Frequency Provider Last Rate Last Dose  . acetaminophen (TYLENOL) tablet 650 mg  650 mg Oral Q6H PRN Audery AmelJohn T Clapacs, MD   650 mg at 01/16/15 0548  . alum & mag hydroxide-simeth (MAALOX/MYLANTA) 200-200-20 MG/5ML suspension 30 mL  30 mL Oral Q4H PRN Audery AmelJohn T Clapacs, MD   30 mL at 01/15/15 1837  . aspirin  EC tablet 81 mg  81 mg Oral QAC breakfast Shari Prows, MD   81 mg at 01/16/15 0828  . benztropine (COGENTIN) tablet 2 mg  2 mg Oral QAC breakfast Shari Prows, MD   2 mg at 01/16/15 0828  . carvedilol (COREG) tablet 3.125 mg  3.125 mg Oral BID WC Audery Amel, MD   3.125 mg at 01/16/15 0829  . cholecalciferol (VITAMIN D) tablet 2,000 Units  2,000 Units Oral QAC breakfast Shari Prows, MD   2,000 Units at 01/16/15 0828  . desmopressin (DDAVP) tablet 0.2 mg  0.2 mg Oral QHS Francy Mcilvaine B Kacen Mellinger, MD   0.2 mg at 01/14/15 2055  . divalproex (DEPAKOTE) DR tablet 1,000 mg  1,000 mg Oral QHS Shari Prows, MD   1,000 mg at 01/14/15 2048  . efavirenz-emtricitabine-tenofovir  (ATRIPLA) 600-200-300 MG per tablet 1 tablet  1 tablet Oral QHS Audery Amel, MD   1 tablet at 01/14/15 2048  . fluPHENAZine (PROLIXIN) tablet 5 mg  5 mg Oral QHS Shari Prows, MD   5 mg at 01/14/15 2049  . fluPHENAZine decanoate (PROLIXIN) injection 50 mg  50 mg Intramuscular Q14 Days Shari Prows, MD   50 mg at 01/15/15 0907  . iohexol (OMNIPAQUE) 240 MG/ML injection 50 mL  50 mL Oral Once PRN Medication Radiologist, MD      . levothyroxine (SYNTHROID, LEVOTHROID) tablet 25 mcg  25 mcg Oral QAC breakfast Audery Amel, MD   25 mcg at 01/16/15 0549  . lithium carbonate capsule 300 mg  300 mg Oral TID WC Ferron Ishmael B Anelise Staron, MD   300 mg at 01/16/15 1238  . magnesium citrate solution 1 Bottle  1 Bottle Oral Once Shari Prows, MD   1 Bottle at 01/10/15 1432  . magnesium hydroxide (MILK OF MAGNESIA) suspension 30 mL  30 mL Oral Daily PRN Audery Amel, MD   30 mL at 01/15/15 1333  . paliperidone (INVEGA SUSTENNA) injection 234 mg  234 mg Intramuscular Q28 days Shari Prows, MD   234 mg at 01/01/15 0943  . paliperidone (INVEGA) 24 hr tablet 3 mg  3 mg Oral QHS Shari Prows, MD   3 mg at 01/14/15 2049  . promethazine (PHENERGAN) tablet 25 mg  25 mg Oral Q6H PRN Shari Prows, MD   25 mg at 01/15/15 1837  . simvastatin (ZOCOR) tablet 20 mg  20 mg Oral q1800 Audery Amel, MD   20 mg at 01/15/15 1836  . tamsulosin (FLOMAX) capsule 0.4 mg  0.4 mg Oral QPC supper Audery Amel, MD   0.4 mg at 01/15/15 1836  . temazepam (RESTORIL) capsule 30 mg  30 mg Oral QHS PRN Shari Prows, MD   30 mg at 01/12/15 2143  . terazosin (HYTRIN) capsule 1 mg  1 mg Oral QHS Audery Amel, MD   1 mg at 01/14/15 2049  . traZODone (DESYREL) tablet 300 mg  300 mg Oral QHS Yukio Bisping B Jakye Mullens, MD   300 mg at 01/15/15 2200    Lab Results: No results found for this or any previous visit (from the past 48 hour(s)).  Physical Findings: AIMS:  , ,  ,  ,    CIWA:     COWS:     Musculoskeletal: Strength & Muscle Tone: within normal limits Gait & Station: normal Patient leans: N/A  Psychiatric Specialty Exam: Review of Systems  Gastrointestinal: Positive for abdominal pain.  Psychiatric/Behavioral: The patient has insomnia.     Blood pressure 114/76, pulse 79, temperature 97.9 F (36.6 C), temperature source Oral, resp. rate 20, height 6' (1.829 m), weight 79.379 kg (175 lb), SpO2 100 %.Body mass index is 23.73 kg/(m^2).  General Appearance: Disheveled  Eye Contact::  Good  Speech:  Clear and Coherent  Volume:  Increased  Mood:  Dysphoric  Affect:  Labile  Thought Process:  Disorganized  Orientation:  Full (Time, Place, and Person)  Thought Content:  Delusions and Paranoid Ideation  Suicidal Thoughts:  No  Homicidal Thoughts:  No  Memory:  Immediate;   Fair Recent;   Fair Remote;   Fair  Judgement:  Impaired  Insight:  Fair and Shallow  Psychomotor Activity:  EPS and Increased  Concentration:  Fair  Recall:  [employer] of Knowledge:Fair  Language: Fair  Akathisia:  No  Handed:  Right  AIMS (if indicated):     Assets:  Communication Skills Desire for Improvement Financial Resources/Insurance Resilience  ADL's:  Intact  Cognition: WNL  Sleep:  Number of Hours: 7.75   Treatment Plan Summary: Daily contact with patient to assess and evaluate symptoms and progress in treatment and Medication management   Mr. Elamin is a 54 year old male with long history of schizoaffective disorder admitted for agitated and threatening behavior at the group home in the context of medication noncompliance.  1. Agitation. Mr. Leeman is loud, agitated and disruptive again.   2. Mood and psychosis. He is maintained on injections of Prolixin and Invega sustenna for psychosis. We will start tapering oral antipsychotics. We continue Depakote at 1000 mg at night and lithium 300 mg tid for mood stabilization. Will check Li and VPA, ammonia level on Friday.  He has a history of high ammonia on Depakote. He is still delusional, paranoid and disorganized in addition to agitation.   3. Hypothyroidism. Continue Synthroid. TSH is normal.  4. HIV. Continue Atripla.  5. Hypertension. Continue antihypertensives. VS well controlled  6. Dyslipidemia. Continue Zocor. Lipid panel is not elevated.  7. BPH. We will continue Flomax and Terazosin.  8. Social. The patient is not allowed to return to his group home. Currently homeless. Unable to care for self without supervision.  9. Insomnia. He slept well with a combination of high dose Restoril and Trazodone.  10. Constipation. He is on bowel regimen.   11. Inguinal hernia. Medicine input is greatly appreciated. He complains of pain not eased with Tylenol. Will start Motrin.   12. Vomiting. Resolved.   13. Urinary incontinence. Will start DDAVP.   14. Disposition. To be established. He can not be placed as he was refused PASSR. He is on waitlist for [facility].     Calder Oblinger 01/16/2015, 1:23 PM

## 2015-01-16 NOTE — Plan of Care (Signed)
Problem: Ineffective individual coping Goal: STG: Pt will be able to identify effective and ineffective STG: Pt will be able to identify effective and ineffective coping patterns  Outcome: Progressing Patient attending groups, journaling.

## 2015-01-17 NOTE — [facility] Group Notes (Signed)
[facility] Group Notes:  (Nursing/MHT/Case Management/Adjunct)  Date:  01/17/2015  Time:  2:03 PM  Type of Therapy:  Psychoeducational Skills  Participation Level:  Active  Participation Quality:  Appropriate, Attentive and Sharing  Affect:  Appropriate  Cognitive:  Alert and Appropriate  Insight:  Appropriate and Good  Engagement in Group:  Engaged  Modes of Intervention:  Discussion, Education and Support  Summary of Progress/Problems:  [name] 01/17/2015, 2:03 PM

## 2015-01-17 NOTE — Progress Notes (Signed)
Recreation Therapy Notes  Date: 10.27.16 Time: 3:15 pm Location: Craft Room  Group Topic: Leisure Education  Goal Area(s) Addresses:  Patient will identify activities for each letter of the alphabet. Patient will verbalize ability to integrate positive leisure into life post d/c. Patient will verbalize ability to use leisure as a Associate Professorcoping skill.  Behavioral Response: Attentive  Intervention: Leisure Alphabet  Activity: Patients were given a Leisure Information systems managerAlphabet worksheet and instructed to think of healthy leisure activities for each letter of the alphabet.  Education: LRT educated patients on what they need to participate in leisure.  Education Outcome: Acknowledges education/In group clarification offered  Clinical Observations/Feedback: Patient completed activity by writing healthy leisure activities down. Patient did not contribute to group discussion.  Jacquelynn CreeGreene,Chirag Krueger M, LRT/CTRS 01/17/2015 4:36 PM

## 2015-01-17 NOTE — Progress Notes (Signed)
[facility] MD Progress Note  01/17/2015 3:14 PM [name]  MRN:  [id number]  Subjective:  [name] is less intrusive and loud after he started taking them again. Today he insists on being discharged immediately to a boarding house. She found information about this place in the phonebook and called the owner already. He is acting initially recommended discharge to the homeless shelter or a boarding house. It is unlikely that the patient will survive in the community without structured environment of the group home. However he was kicked out of his GROUP home and has no PASSR to be placed again. The patient is on the wait list for [facility]. He has no complaints except for abdominal pain that he associates with the hernia. He continues to wet his bed in by a clinician of DDAVP. He participates in groups some.  The patient told me a letter again to say "I would never change because I never desire to change under any circumstances whatsoever. And I mean that. I would get to the core and your people are just wasting your time giving me these drugs".  Principal Problem: Schizoaffective disorder, bipolar type (HCC) Diagnosis:   Patient Active Problem List   Diagnosis Date Noted  . Scrotal pain [N50.82] 01/11/2015  . Tobacco use disorder [F17.200] 12/27/2014  . HIV positive (HCC) [Z21] 12/26/2014  . Hypertension [I10] 12/26/2014  . Hypothyroidism [E03.9] 12/26/2014  . Dyslipidemia [E78.5] 12/26/2014  . Schizoaffective disorder, bipolar type (HCC) [F25.0] 10/25/2014  . Alcohol abuse [F10.10] 10/25/2014   Total Time spent with patient: 20 minutes  Past Psychiatric History: Schizoaffective disorder.  Past Medical History:  Past Medical History  Diagnosis Date  . HIV (human immunodeficiency virus infection) (HCC)   . BPH (benign prostatic hypertrophy)   . Dyslipidemia   . Hypertension   . Hypothyroidism   . Schizoaffective disorder (HCC)    History reviewed. No pertinent past  surgical history. Family History:  Family History  Problem Relation Age of Onset  . Hypertension Other    Family Psychiatric  History: None reported. Social History:  History  Alcohol Use  . 1.2 - 1.8 oz/week  . 2-3 Cans of beer per week     History  Drug Use No    Social History   Social History  . Marital Status: Single    Spouse Name: N/A  . Number of Children: N/A  . Years of Education: N/A   Social History Main Topics  . Smoking status: Current Every Day Smoker -- 2.00 packs/day    Types: Cigarettes  . Smokeless tobacco: None  . Alcohol Use: 1.2 - 1.8 oz/week    2-3 Cans of beer per week  . Drug Use: No  . Sexual Activity: Not Asked   Other Topics Concern  . None   Social History Narrative   Additional Social History:                         Sleep: Fair  Appetite:  Fair  Current Medications: Current Facility-Administered Medications  Medication Dose Route Frequency Provider Last Rate Last Dose  . acetaminophen (TYLENOL) tablet 650 mg  650 mg Oral Q6H PRN Audery Amel, MD   650 mg at 01/16/15 0548  . alum & mag hydroxide-simeth (MAALOX/MYLANTA) 200-200-20 MG/5ML suspension 30 mL  30 mL Oral Q4H PRN Audery Amel, MD   30 mL at 01/15/15 1837  . aspirin EC tablet 81 mg  81 mg  Oral QAC breakfast Shari Prows, MD   81 mg at 01/17/15 0839  . benztropine (COGENTIN) tablet 2 mg  2 mg Oral QAC breakfast Shari Prows, MD   2 mg at 01/17/15 0839  . carvedilol (COREG) tablet 3.125 mg  3.125 mg Oral BID WC Audery Amel, MD   3.125 mg at 01/17/15 0839  . cholecalciferol (VITAMIN D) tablet 2,000 Units  2,000 Units Oral QAC breakfast Shari Prows, MD   2,000 Units at 01/17/15 0839  . desmopressin (DDAVP) tablet 0.2 mg  0.2 mg Oral QHS Journee Kohen B Rawley Harju, MD   0.2 mg at 01/16/15 2139  . divalproex (DEPAKOTE) DR tablet 1,000 mg  1,000 mg Oral QHS Shari Prows, MD   500 mg at 01/16/15 2139  . efavirenz-emtricitabine-tenofovir  (ATRIPLA) 600-200-300 MG per tablet 1 tablet  1 tablet Oral QHS Audery Amel, MD   1 tablet at 01/16/15 2138  . fluPHENAZine decanoate (PROLIXIN) injection 50 mg  50 mg Intramuscular Q14 Days Shari Prows, MD   50 mg at 01/15/15 0907  . ibuprofen (ADVIL,MOTRIN) tablet 600 mg  600 mg Oral Q6H PRN Trayce Caravello B Lowen Barringer, MD   600 mg at 01/16/15 1356  . iohexol (OMNIPAQUE) 240 MG/ML injection 50 mL  50 mL Oral Once PRN Medication Radiologist, MD      . levothyroxine (SYNTHROID, LEVOTHROID) tablet 25 mcg  25 mcg Oral QAC breakfast Audery Amel, MD   25 mcg at 01/17/15 0655  . lithium carbonate capsule 300 mg  300 mg Oral TID WC Markasia Carrol B Tinzlee Craker, MD   300 mg at 01/17/15 1239  . magnesium citrate solution 1 Bottle  1 Bottle Oral Once Shari Prows, MD   1 Bottle at 01/10/15 1432  . magnesium hydroxide (MILK OF MAGNESIA) suspension 30 mL  30 mL Oral Daily PRN Audery Amel, MD   30 mL at 01/15/15 1333  . paliperidone (INVEGA SUSTENNA) injection 234 mg  234 mg Intramuscular Q28 days Shari Prows, MD   234 mg at 01/01/15 0943  . promethazine (PHENERGAN) tablet 25 mg  25 mg Oral Q6H PRN Shari Prows, MD   25 mg at 01/15/15 1837  . simvastatin (ZOCOR) tablet 20 mg  20 mg Oral q1800 Audery Amel, MD   20 mg at 01/16/15 1654  . tamsulosin (FLOMAX) capsule 0.4 mg  0.4 mg Oral QPC supper Audery Amel, MD   0.4 mg at 01/16/15 1654  . temazepam (RESTORIL) capsule 30 mg  30 mg Oral QHS PRN Shari Prows, MD   30 mg at 01/12/15 2143  . terazosin (HYTRIN) capsule 1 mg  1 mg Oral QHS Audery Amel, MD   1 mg at 01/16/15 2139  . traZODone (DESYREL) tablet 300 mg  300 mg Oral QHS Shari Prows, MD   100 mg at 01/16/15 2138    Lab Results: No results found for this or any previous visit (from the past 48 hour(s)).  Physical Findings: AIMS:  , ,  ,  ,    CIWA:    COWS:     Musculoskeletal: Strength & Muscle Tone: within normal limits Gait & Station:  normal Patient leans: N/A  Psychiatric Specialty Exam: Review of Systems  Gastrointestinal: Positive for abdominal pain.  All other systems reviewed and are negative.   Blood pressure 155/99, pulse 80, temperature 97.9 F (36.6 C), temperature source Oral, resp. rate 20, height 6' (1.829 m), weight 79.379  kg (175 lb), SpO2 100 %.Body mass index is 23.73 kg/(m^2).  General Appearance: Disheveled  Eye Contact::  Good  Speech:  Pressured  Volume:  Increased  Mood:  Angry, Dysphoric and Irritable  Affect:  Labile  Thought Process:  Disorganized  Orientation:  Full (Time, Place, and Person)  Thought Content:  Delusions and Paranoid Ideation  Suicidal Thoughts:  No  Homicidal Thoughts:  No  Memory:  Immediate;   Fair Recent;   Fair Remote;   Fair  Judgement:  Impaired  Insight:  Shallow  Psychomotor Activity:  Increased  Concentration:  Fair  Recall:  [employer]  Fund of Knowledge:Fair  Language: Fair  Akathisia:  No  Handed:  Right  AIMS (if indicated):     Assets:  Communication Skills Desire for Improvement Financial Resources/Insurance Resilience  ADL's:  Intact  Cognition: WNL  Sleep:  Number of Hours: 5.75   Treatment Plan Summary: Daily contact with patient to assess and evaluate symptoms and progress in treatment and Medication management   [name] is a 54 year old male with long history of schizoaffective disorder admitted for agitated and threatening behavior at the group home in the context of medication noncompliance.  1. Agitation. [name] is loud, agitated and disruptive again.   2. Mood and psychosis. He is maintained on injections of Prolixin and Invega sustenna for psychosis. We will start tapering oral antipsychotics. We continue Depakote at 1000 mg at night and lithium 300 mg tid for mood stabilization. Will check Li and VPA, ammonia level on Friday. He has a history of high ammonia on Depakote. He is still delusional, paranoid and disorganized in  addition to agitation.   3. Hypothyroidism. Continue Synthroid. TSH is normal.  4. HIV. Continue Atripla.  5. Hypertension. Continue antihypertensives. VS well controlled  6. Dyslipidemia. Continue Zocor. Lipid panel is not elevated.  7. BPH. We will continue Flomax and Terazosin.  8. Social. The patient is not allowed to return to his group home. Currently homeless. Unable to care for self without supervision.  9. Insomnia. He slept well with a combination of high dose Restoril and Trazodone.  10. Constipation. He is on bowel regimen.   11. Inguinal hernia. Medicine input is greatly appreciated. He complains of pain not eased with Tylenol. We started Motrin.   12. Vomiting. Resolved.   13. Urinary incontinence. Will start DDAVP.   14. Disposition. To be established. He can not be placed as he was refused PASSR. [facility]   No medication adjustments were offered the patient improves gradually.01/17/2015.    Sofya Moustafa 01/17/2015, 3:14 PM

## 2015-01-17 NOTE — Tx Team (Signed)
Interdisciplinary Treatment Plan Update (Adult)  Date:  01/17/2015 Time Reviewed:  4:13 PM  Progress in Treatment: Attending groups: Yes. Participating in groups:  Yes. Taking medication as prescribed:  Yes. Tolerating medication:  Yes. Family/Significant othe contact made:  Yes, individual(s) contacted:  ACT team Patient understands diagnosis:  No. and As evidenced by:  Limited insight  Discussing patient identified problems/goals with staff:  Yes. Medical problems stabilized or resolved:  Yes. Denies suicidal/homicidal ideation: Yes. Issues/concerns per patient self-inventory:  Yes. Other:  New problem(s) identified: No, Describe:  NA  Discharge Plan or Barriers: Pt wants to discharge to a boarding house.   Reason for Continuation of Hospitalization: Mania Medication stabilization  Comments: [name] is less intrusive and loud after he started taking them again. Today he insists on being discharged immediately to a boarding house. She found information about this place in the phonebook and called the owner already. He is acting initially recommended discharge to the homeless shelter or a boarding house. It is unlikely that the patient will survive in the community without structured environment of the group home. However he was kicked out of his GROUP home and has no PASSR to be placed again. The patient is on the wait list for [facility]. He has no complaints except for abdominal pain that he associates with the hernia. He continues to wet his bed in by a clinician of [facility]. He participates in groups some.  Estimated length of stay: 7 days   New goal(s):  Review of initial/current patient goals per problem list:   1.  Goal(s): Patient will participate in aftercare plan * Met:  * Target date: at discharge * As evidenced by: Patient will participate within aftercare plan AEB aftercare provider and housing plan at discharge being identified.   2.  Goal (s):  Patient will exhibit decreased depressive symptoms and suicidal ideations. * Met:  *  Target date: at discharge * As evidenced by: Patient will utilize self rating of depression at 3 or below and demonstrate decreased signs of depression or be deemed stable for discharge by MD.  3.  Goal (s): Patient will demonstrate decreased symptoms of psychosis. * Met: No  *  Target date: at discharge * As evidenced by: Patient will not endorse signs of psychosis or be deemed stable for discharge by MD.   Attendees: Patient:  [name] 10/27/20164:13 PM  Family:   10/27/20164:13 PM  Physician:   Dr. Bary Leriche  10/27/20164:13 PM  Nursing:   Abigail Butts, RN  10/27/20164:13 PM  Case Manager:   10/27/20164:13 PM  Counselor:   10/27/20164:13 PM  Other:  Wray Kearns, LCSWA 10/27/20164:13 PM  Other:  Everitt Amber, [place]  10/27/20164:13 PM  Other:   10/27/20164:13 PM  Other:  10/27/20164:13 PM  Other:  10/27/20164:13 PM  Other:  10/27/20164:13 PM  Other:  10/27/20164:13 PM  Other:  10/27/20164:13 PM  Other:  10/27/20164:13 PM  Other:   10/27/20164:13 PM   Scribe for Treatment Team:   Wray Kearns, MSW, LCSWA  01/17/2015, 4:13 PM

## 2015-01-17 NOTE — Plan of Care (Signed)
Problem: Alteration in mood Goal: STG-Pt Able to Identify Plan For Continuing Care at D/C Pt. Will be able to identify a plan for continuing care at discharge  Outcome: Progressing Patient wants to follow up with his ACT team and live in a boarding house after discharge. Has found a boarding house that may accept him.

## 2015-01-17 NOTE — Progress Notes (Signed)
D: Patient has been pleasant today. States he has found a boarding house where he can live and hopes to be discharged there. He denies SI/HI or AVH. Taking meds. Attending some groups. A: On q 15 minute checks; given meds. R: Cooperative. Hopeful.

## 2015-01-17 NOTE — Progress Notes (Signed)
D: Patient denies SI/HI/AVH. Patient affect and mood continue to be labile.  Patient yelling at staff demanding to be called "[name]."  He states he no longer wants to be called "[name]."    Patient did attend evening group. Patient visible on the milieu.  A: Support and encouragement offered. Patient refused to take 1 of his scheduled Depakote pills and 2 of his schedule Trazodone.  Q 15 min checks continued for patient safety. R: Patient receptive. Patient remains safe on the unit.

## 2015-01-18 LAB — LITHIUM LEVEL: Lithium Lvl: 0.37 mmol/L — ABNORMAL LOW (ref 0.60–1.20)

## 2015-01-18 LAB — VALPROIC ACID LEVEL: Valproic Acid Lvl: 57 ug/mL (ref 50.0–100.0)

## 2015-01-18 MED ORDER — ZOLPIDEM TARTRATE 5 MG PO TABS
10.0000 mg | ORAL_TABLET | Freq: Every evening | ORAL | Status: DC | PRN
  Administered 2015-01-18 – 2015-01-20 (×2): 10 mg via ORAL
  Filled 2015-01-18 (×2): qty 2

## 2015-01-18 NOTE — Progress Notes (Signed)
[facility] MD Progress Note  01/18/2015 8:42 PM [name]  MRN:  [id number]  Subjective:  [name] has a long history of schizoafetive disorder. He was admitted floridly psychotic in spite of treatment with injectable antipsychotics. He is currently on Invega sustenna and Prolixin decanoate. He is also on Lithium and Depkote but has spotty complaince with lithium that he thinks makes his hernia worse. He ha had hernia for several years and refuses surgery. He was consulted by medicine for abdominal pain. His lithium level today is subtherapeutic. VPA level is 57. Will check ammonia as he has history of elevated ammonia on depakote. I will hold depakote tonight.  [name] is subdued today. He is in bed most of the day. No somatic complaints. Denies any symptoms of depression, anxiety, or psychosis.  Principal Problem: Schizoaffective disorder, bipolar type (HCC) Diagnosis:   Patient Active Problem List   Diagnosis Date Noted  . Scrotal pain [N50.82] 01/11/2015  . Tobacco use disorder [F17.200] 12/27/2014  . HIV positive (HCC) [Z21] 12/26/2014  . Hypertension [I10] 12/26/2014  . Hypothyroidism [E03.9] 12/26/2014  . Dyslipidemia [E78.5] 12/26/2014  . Schizoaffective disorder, bipolar type (HCC) [F25.0] 10/25/2014  . Alcohol abuse [F10.10] 10/25/2014   Total Time spent with patient: 20 minutes  Past Psychiatric History: schizoaffective disorder.  Past Medical History:  Past Medical History  Diagnosis Date  . HIV (human immunodeficiency virus infection) (HCC)   . BPH (benign prostatic hypertrophy)   . Dyslipidemia   . Hypertension   . Hypothyroidism   . Schizoaffective disorder (HCC)    History reviewed. No pertinent past surgical history. Family History:  Family History  Problem Relation Age of Onset  . Hypertension Other    Family Psychiatric  History: none reported.  Social History:  History  Alcohol Use  . 1.2 - 1.8 oz/week  . 2-3 Cans of beer per week     History   Drug Use No    Social History   Social History  . Marital Status: Single    Spouse Name: N/A  . Number of Children: N/A  . Years of Education: N/A   Social History Main Topics  . Smoking status: Current Every Day Smoker -- 2.00 packs/day    Types: Cigarettes  . Smokeless tobacco: None  . Alcohol Use: 1.2 - 1.8 oz/week    2-3 Cans of beer per week  . Drug Use: No  . Sexual Activity: Not Asked   Other Topics Concern  . None   Social History Narrative   Additional Social History:                         Sleep: Fair  Appetite:  Fair  Current Medications: Current Facility-Administered Medications  Medication Dose Route Frequency Provider Last Rate Last Dose  . acetaminophen (TYLENOL) tablet 650 mg  650 mg Oral Q6H PRN Audery Amel, MD   650 mg at 01/16/15 0548  . alum & mag hydroxide-simeth (MAALOX/MYLANTA) 200-200-20 MG/5ML suspension 30 mL  30 mL Oral Q4H PRN Audery Amel, MD   30 mL at 01/15/15 1837  . aspirin EC tablet 81 mg  81 mg Oral QAC breakfast Shari Prows, MD   81 mg at 01/18/15 0817  . benztropine (COGENTIN) tablet 2 mg  2 mg Oral QAC breakfast Shari Prows, MD   2 mg at 01/18/15 0817  . carvedilol (COREG) tablet 3.125 mg  3.125 mg Oral BID WC John T Clapacs,  MD   3.125 mg at 01/18/15 1707  . cholecalciferol (VITAMIN D) tablet 2,000 Units  2,000 Units Oral QAC breakfast Shari Prows, MD   2,000 Units at 01/18/15 0817  . desmopressin (DDAVP) tablet 0.2 mg  0.2 mg Oral QHS Kailoni Vahle B Arli Bree, MD   0.2 mg at 01/17/15 2223  . divalproex (DEPAKOTE) DR tablet 1,000 mg  1,000 mg Oral QHS Shari Prows, MD   1,000 mg at 01/17/15 2222  . efavirenz-emtricitabine-tenofovir (ATRIPLA) 600-200-300 MG per tablet 1 tablet  1 tablet Oral QHS Audery Amel, MD   1 tablet at 01/17/15 2223  . fluPHENAZine decanoate (PROLIXIN) injection 50 mg  50 mg Intramuscular Q14 Days Shari Prows, MD   50 mg at 01/15/15 0907  . ibuprofen  (ADVIL,MOTRIN) tablet 600 mg  600 mg Oral Q6H PRN Blaze Sandin B Jerred Zaremba, MD   600 mg at 01/16/15 1356  . iohexol (OMNIPAQUE) 240 MG/ML injection 50 mL  50 mL Oral Once PRN Medication Radiologist, MD      . levothyroxine (SYNTHROID, LEVOTHROID) tablet 25 mcg  25 mcg Oral QAC breakfast Audery Amel, MD   25 mcg at 01/18/15 0817  . lithium carbonate capsule 300 mg  300 mg Oral TID WC Tabbitha Janvrin B Nasra Counce, MD   300 mg at 01/18/15 1652  . magnesium citrate solution 1 Bottle  1 Bottle Oral Once Shari Prows, MD   1 Bottle at 01/10/15 1432  . magnesium hydroxide (MILK OF MAGNESIA) suspension 30 mL  30 mL Oral Daily PRN Audery Amel, MD   30 mL at 01/15/15 1333  . paliperidone (INVEGA SUSTENNA) injection 234 mg  234 mg Intramuscular Q28 days Shari Prows, MD   234 mg at 01/01/15 0943  . promethazine (PHENERGAN) tablet 25 mg  25 mg Oral Q6H PRN Shari Prows, MD   25 mg at 01/15/15 1837  . simvastatin (ZOCOR) tablet 20 mg  20 mg Oral q1800 Audery Amel, MD   20 mg at 01/18/15 1652  . tamsulosin (FLOMAX) capsule 0.4 mg  0.4 mg Oral QPC supper Audery Amel, MD   0.4 mg at 01/18/15 1652  . temazepam (RESTORIL) capsule 30 mg  30 mg Oral QHS PRN Shari Prows, MD   30 mg at 01/12/15 2143  . terazosin (HYTRIN) capsule 1 mg  1 mg Oral QHS Audery Amel, MD   1 mg at 01/17/15 2223  . traZODone (DESYREL) tablet 300 mg  300 mg Oral QHS Shari Prows, MD   300 mg at 01/17/15 2223    Lab Results:  Results for orders placed or performed during the hospital encounter of 12/26/14 (from the past 48 hour(s))  Lithium level     Status: Abnormal   Collection Time: 01/18/15  6:05 AM  Result Value Ref Range   Lithium Lvl 0.37 (L) 0.60 - 1.20 mmol/L  Valproic acid level     Status: None   Collection Time: 01/18/15  6:05 AM  Result Value Ref Range   Valproic Acid Lvl 57 50.0 - 100.0 ug/mL    Physical Findings: AIMS:  , ,  ,  ,    CIWA:    COWS:      Musculoskeletal: Strength & Muscle Tone: within normal limits Gait & Station: normal Patient leans: N/A  Psychiatric Specialty Exam: Review of Systems  Gastrointestinal: Positive for abdominal pain.  All other systems reviewed and are negative.   Blood pressure 119/78, pulse 76,  temperature 98.2 F (36.8 C), temperature source Oral, resp. rate 20, height 6' (1.829 m), weight 79.379 kg (175 lb), SpO2 100 %.Body mass index is 23.73 kg/(m^2).  General Appearance: Disheveled  Eye Contact::  Minimal  Speech:  Slurred  Volume:  Decreased  Mood:  Depressed  Affect:  Labile  Thought Process:  Disorganized  Orientation:  Full (Time, Place, and Person)  Thought Content:  Delusions and Paranoid Ideation  Suicidal Thoughts:  No  Homicidal Thoughts:  No  Memory:  Immediate;   Fair Recent;   Fair Remote;   Fair  Judgement:  Impaired  Insight:  Lacking  Psychomotor Activity:  Decreased  Concentration:  Fair  Recall:  F[employer]Fair  Fund of Knowledge:Fair  Language: Fair  Akathisia:  No  Handed:  Right  AIMS (if indicated):     Assets:  Communication Skills  ADL's:  Intact  Cognition: WNL  Sleep:  Number of Hours: 2.45   Treatment Plan Summary: Daily contact with patient to assess and evaluate symptoms and progress in treatment and Medication management   [name] is a 54 year old male with long history of schizoaffective disorder admitted for agitated and threatening behavior at the group home in the context of partial medication compliance.  1. Agitation. [name] is calm today.    2. Mood and psychosis. He is maintained on injections of Prolixin and Invega sustenna for psychosis. We will start tapering oral antipsychotics. We continue Depakote at 1000 mg at night and lithium 300 mg tid for mood stabilization. Will check Li and VPA, ammonia level on Friday. He has a history of high ammonia on Depakote. He is still delusional, paranoid and disorganized in addition to agitation.   3.  Hypothyroidism. Continue Synthroid. TSH is normal.  4. HIV. Continue Atripla.  5. Hypertension. Continue antihypertensives. VS well controlled  6. Dyslipidemia. Continue Zocor. Lipid panel is not elevated.  7. BPH. We will continue Flomax and Terazosin.  8. Social. The patient is not allowed to return to his group home. Currently homeless. Unable to care for self without supervision.  9. Insomnia. He slept only 3 hours on a combination of high dose Restoril and Trazodone. Will start Ambien tonight.  10. Constipation. He is on bowel regimen.   11. Inguinal hernia. Medicine input is greatly appreciated. He complains of pain not eased with Tylenol. We started Motrin.   12. Vomiting. Resolved.   13. Urinary incontinence. We started DDAVP.   14. Disposition. To be established. He can not be placed as he was refused PASSR. [facility].   Shaye Lagace 01/18/2015, 8:42 PM

## 2015-01-18 NOTE — Progress Notes (Signed)
Recreation Therapy Notes  Date: 10.28.16 Time: 3:00 pm Location: Craft Room  Group Topic: Communication, Problem Solving, Teamwork  Goal Area(s) Addresses:  Patient will effectively work with peers towards shared goal. Patient will identify skills used to make activity successful. Patient will identify benefit of using group skills effectively post d/c.  Behavioral Response: Did not attend  Intervention: [employer] Tower  Activity: Patients were given 15 pipe cleanser and instructed to build the tallest free standing tower. Patients were given 2 minutes to strategize. After approximately 5 minutes of building, patients were instructed to put their dominant hand behind their back. After approximately 3 minutes of building, patients were instructed to stop talking to each other.    Education: LRT educated patients on how communication, problem solving, and teamwork goes in to building a healthy support system.  Education Outcome: Patient did not attend group.  Clinical Observations/Feedback: Patient did not attend group.  Jacquelynn CreeGreene,Damaris Abeln M, LRT/CTRS 01/18/2015 4:16 PM

## 2015-01-18 NOTE — Progress Notes (Signed)
Patient with sad affect and cooperative behavior with meals, meds and plan of care. No SI/HI at this time. Good appetite and good adls. Minimal interaction with peers, verbalizes needs appropriately with staff. Patient smiles when discussing that he may have found a boarding house to stay in. Safety maintained.

## 2015-01-18 NOTE — Plan of Care (Signed)
Problem: Consults Goal: [facility] General Treatment Patient Education Outcome: Progressing Patient cooperative with meds and plan of care.     

## 2015-01-18 NOTE — [facility] Group Notes (Signed)
[facility] LCSW Aftercare Discharge Planning Group Note   01/18/2015 3:25 PM  Participation Quality:  Did not attend.   Blessing Zaucha L Tiant [place]oto MSW, LCSWA     

## 2015-01-18 NOTE — [facility] Group Notes (Signed)
[facility]  01/18/2015 5:04 PM  Type of Therapy:  Group Therapy  Participation Level:  Did Not Attend   Lulu Ridingngle, Macyn Shropshire T, MSW, LCSWA 01/18/2015, 5:04 PM

## 2015-01-18 NOTE — [facility] Group Notes (Signed)
[facility] Group Notes:  (Nursing/MHT/Case Management/Adjunct)  Date:  01/18/2015  Time:  9:57 PM  Type of Therapy:  Evening Wrap-up Group  Participation Level:  Did Not Attend  Participation Quality:  N/A  Affect:  N/A  Cognitive:  N/A  Insight:  None  Engagement in Group:  N/A  Modes of Intervention:  N/A  Summary of Progress/Problems:  Daniel MorrowChelsea Benitez [name] 01/18/2015, 9:57 PM

## 2015-01-18 NOTE — Plan of Care (Signed)
Problem: Ineffective individual coping Goal: STG: Pt will be able to identify effective and ineffective STG: Pt will be able to identify effective and ineffective coping patterns  Outcome: Progressing Pt able to identify some coping skills.

## 2015-01-18 NOTE — Progress Notes (Signed)
Pt spent most of the evening hours in his room, he urinated all over his bed. Pt required several encouragement before coming for his HS medications. Pt denies any SI/HI, no concerns voiced, safety maintained.

## 2015-01-19 NOTE — Plan of Care (Signed)
Problem: Ineffective individual coping Goal: STG: Patient will remain free from self harm Outcome: Progressing Pt remains free of harm this shift. Stated understanding of coping skills to use.

## 2015-01-19 NOTE — Progress Notes (Signed)
Pt is calm and cooperative this PM, denies any SI/HI/VAH; compliant with medications. No concerns voiced, safety maintained.

## 2015-01-19 NOTE — Progress Notes (Signed)
Patient with depressed affect and cooperative behavior with meals, meds and plan of care. No SI/HI at this time. Fair adls, good appetite. Minimal interaction with peers, verbalizes needs appropriately with staff. Safety maintained.

## 2015-01-19 NOTE — Plan of Care (Signed)
Problem: Spiritual Needs Goal: Ability to function at adequate level Outcome: Progressing Patient able to perform adls and ambulates to eat meals independently.

## 2015-01-19 NOTE — [facility] Group Notes (Signed)
[facility] LCSW Group Therapy  01/19/2015 2:32 PM  Type of Therapy:  Group Therapy  Participation Level:  Did Not Attend  Modes of Intervention:  Discussion, Education, Socialization and Support  Summary of Progress/Problems: Pt will identify unhealthy thoughts and how they impact their emotions and behavior. Pt will be encouraged to discuss these thoughts, emotions and behaviors with the group.   Felisha Claytor L Ranell Finelli MSW, LCSWA  01/19/2015, 2:32 PM  

## 2015-01-19 NOTE — Progress Notes (Addendum)
[facility] MD Progress Note  01/19/2015 4:28 PM Jay Haskew  MRN:  [id number]  Subjective:  Mr. Kocsis has a long history of schizoaffective disorder. He was admitted floridly psychotic in spite of treatment with injectable antipsychotics. He is currently on Invega sustenna and Prolixin decanoate. He is also on Lithium and Depkote but has spotty complaince with lithium that he thinks makes his hernia worse. He had hernia for several years and refuses surgery. He was consulted by medicine for abdominal pain. His lithium level today is sub therapeutic. VPA level is 57. Will check ammonia as he has history of elevated ammonia on Depakote. I will hold Depakote tonight.  The patient was not interested in speaking with writer today however I was able to elicit from him that he was not having any physical aches or pains at this time. Patient directed writer to letters he  left under the door of Dr. Collier Flowers office. He stated everything he has to say is in those letters.  Principal Problem: Schizoaffective disorder, bipolar type (HCC) Diagnosis:   Patient Active Problem List   Diagnosis Date Noted  . Scrotal pain [N50.82] 01/11/2015  . Tobacco use disorder [F17.200] 12/27/2014  . HIV positive (HCC) [Z21] 12/26/2014  . Hypertension [I10] 12/26/2014  . Hypothyroidism [E03.9] 12/26/2014  . Dyslipidemia [E78.5] 12/26/2014  . Schizoaffective disorder, bipolar type (HCC) [F25.0] 10/25/2014  . Alcohol abuse [F10.10] 10/25/2014   Total Time spent with patient: 20 minutes  Past Psychiatric History: schizoaffective disorder.  Past Medical History:  Past Medical History  Diagnosis Date  . HIV (human immunodeficiency virus infection) (HCC)   . BPH (benign prostatic hypertrophy)   . Dyslipidemia   . Hypertension   . Hypothyroidism   . Schizoaffective disorder (HCC)    History reviewed. No pertinent past surgical history. Family History:  Family History  Problem Relation Age of Onset  . Hypertension Other     Family Psychiatric  History: none reported.  Social History:  History  Alcohol Use  . 1.2 - 1.8 oz/week  . 2-3 Cans of beer per week     History  Drug Use No    Social History   Social History  . Marital Status: Single    Spouse Name: N/A  . Number of Children: N/A  . Years of Education: N/A   Social History Main Topics  . Smoking status: Current Every Day Smoker -- 2.00 packs/day    Types: Cigarettes  . Smokeless tobacco: None  . Alcohol Use: 1.2 - 1.8 oz/week    2-3 Cans of beer per week  . Drug Use: No  . Sexual Activity: Not Asked   Other Topics Concern  . None   Social History Narrative   Additional Social History:                         Sleep: Fair  Appetite:  Fair  Current Medications: Current Facility-Administered Medications  Medication Dose Route Frequency Provider Last Rate Last Dose  . acetaminophen (TYLENOL) tablet 650 mg  650 mg Oral Q6H PRN Audery Amel, MD   650 mg at 01/16/15 0548  . alum & mag hydroxide-simeth (MAALOX/MYLANTA) 200-200-20 MG/5ML suspension 30 mL  30 mL Oral Q4H PRN Audery Amel, MD   30 mL at 01/15/15 1837  . aspirin EC tablet 81 mg  81 mg Oral QAC breakfast Shari Prows, MD   81 mg at 01/19/15 0836  . benztropine (COGENTIN) tablet 2 mg  2 mg Oral QAC breakfast Shari Prows, MD   2 mg at 01/19/15 0836  . carvedilol (COREG) tablet 3.125 mg  3.125 mg Oral BID WC Audery Amel, MD   3.125 mg at 01/19/15 0837  . cholecalciferol (VITAMIN D) tablet 2,000 Units  2,000 Units Oral QAC breakfast Shari Prows, MD   2,000 Units at 01/19/15 0836  . desmopressin (DDAVP) tablet 0.2 mg  0.2 mg Oral QHS Jolanta B Pucilowska, MD   0.2 mg at 01/18/15 2242  . efavirenz-emtricitabine-tenofovir (ATRIPLA) 600-200-300 MG per tablet 1 tablet  1 tablet Oral QHS Audery Amel, MD   1 tablet at 01/18/15 2242  . fluPHENAZine decanoate (PROLIXIN) injection 50 mg  50 mg Intramuscular Q14 Days Shari Prows, MD    50 mg at 01/15/15 0907  . ibuprofen (ADVIL,MOTRIN) tablet 600 mg  600 mg Oral Q6H PRN Jolanta B Pucilowska, MD   600 mg at 01/16/15 1356  . iohexol (OMNIPAQUE) 240 MG/ML injection 50 mL  50 mL Oral Once PRN Medication Radiologist, MD      . levothyroxine (SYNTHROID, LEVOTHROID) tablet 25 mcg  25 mcg Oral QAC breakfast Audery Amel, MD   25 mcg at 01/19/15 [phone]  . lithium carbonate capsule 300 mg  300 mg Oral TID WC Jolanta B Pucilowska, MD   300 mg at 01/19/15 1215  . magnesium citrate solution 1 Bottle  1 Bottle Oral Once Shari Prows, MD   1 Bottle at 01/10/15 1432  . magnesium hydroxide (MILK OF MAGNESIA) suspension 30 mL  30 mL Oral Daily PRN Audery Amel, MD   30 mL at 01/15/15 1333  . paliperidone (INVEGA SUSTENNA) injection 234 mg  234 mg Intramuscular Q28 days Shari Prows, MD   234 mg at 01/01/15 0943  . promethazine (PHENERGAN) tablet 25 mg  25 mg Oral Q6H PRN Shari Prows, MD   25 mg at 01/15/15 1837  . simvastatin (ZOCOR) tablet 20 mg  20 mg Oral q1800 Audery Amel, MD   20 mg at 01/18/15 1652  . tamsulosin (FLOMAX) capsule 0.4 mg  0.4 mg Oral QPC supper Audery Amel, MD   0.4 mg at 01/18/15 1652  . terazosin (HYTRIN) capsule 1 mg  1 mg Oral QHS Audery Amel, MD   1 mg at 01/18/15 2242  . traZODone (DESYREL) tablet 300 mg  300 mg Oral QHS Shari Prows, MD   300 mg at 01/18/15 2242  . zolpidem (AMBIEN) tablet 10 mg  10 mg Oral QHS PRN Shari Prows, MD   10 mg at 01/18/15 2244    Lab Results:  Results for orders placed or performed during the hospital encounter of 12/26/14 (from the past 48 hour(s))  Lithium level     Status: Abnormal   Collection Time: 01/18/15  6:05 AM  Result Value Ref Range   Lithium Lvl 0.37 (L) 0.60 - 1.20 mmol/L  Valproic acid level     Status: None   Collection Time: 01/18/15  6:05 AM  Result Value Ref Range   Valproic Acid Lvl 57 50.0 - 100.0 ug/mL    Physical Findings: AIMS:  , ,  ,  ,    CIWA:     COWS:     Musculoskeletal: Strength & Muscle Tone: within normal limits Gait & Station: normal Patient leans: N/A  Psychiatric Specialty Exam: Review of Systems  Gastrointestinal: Positive for abdominal pain.  All other systems reviewed and are  negative.   Blood pressure 111/75, pulse 75, temperature 98.1 F (36.7 C), temperature source Oral, resp. rate 20, height 6' (1.829 m), weight 79.379 kg (175 lb), SpO2 100 %.Body mass index is 23.73 kg/(m^2).  General Appearance: Disheveled  Eye Contact::  Minimal  Speech:  Slurred  Volume:  Decreased  Mood:  Depressed  Affect:  Labile  Thought Process:  Disorganized  Orientation:  Full (Time, Place, and Person)  Thought Content:  Delusions and Paranoid Ideation  Suicidal Thoughts:  No  Homicidal Thoughts:  No  Memory:  Immediate;   Fair Recent;   Fair Remote;   Fair  Judgement:  Impaired  Insight:  Lacking  Psychomotor Activity:  Decreased  Concentration:  Fair  Recall:  [employer]Fair  Fund of Knowledge:Fair  Language: Fair  Akathisia:  No  Handed:  Right  AIMS (if indicated):     Assets:  Communication Skills  ADL's:  Intact  Cognition: WNL  Sleep:  Number of Hours: 4.15   Treatment Plan Summary: Daily contact with patient to assess and evaluate symptoms and progress in treatment and Medication management   [name] is a 54 year old male with long history of schizoaffective disorder admitted for agitated and threatening behavior at the group home in the context of partial medication compliance.  1. Agitation. [name] is calm today.    2. Mood and psychosis. He is maintained on injections of Prolixin and Invega sustenna for psychosis. We will start tapering oral antipsychotics. We continue Depakote at 1000 mg at night and lithium 300 mg tid for mood stabilization. Will check Li and VPA, ammonia level on Friday. He has a history of high ammonia on Depakote. He is still delusional, paranoid and disorganized in addition to  agitation.   3. Hypothyroidism. Continue Synthroid. TSH is normal.  4. HIV. Continue Atripla.  5. Hypertension. Continue antihypertensives. VS well controlled  6. Dyslipidemia. Continue Zocor. Lipid panel is not elevated.  7. BPH. We will continue Flomax and Terazosin.  8. Social. The patient is not allowed to return to his group home. Currently homeless. Unable to care for self without supervision.  9. Insomnia. He slept only 3 hours on a combination of high dose Restoril and Trazodone. Will start Ambien tonight.  10. Constipation. He is on bowel regimen.   11. Inguinal hernia. Medicine input is greatly appreciated. He complains of pain not eased with Tylenol. We started Motrin.   12. Vomiting. Resolved.   13. Urinary incontinence. We started DDAVP.   14. Disposition. To be established. He can not be placed as he was refused PASSR. He is on wait list for [facility].   Wallace Goinglton Madden Piazza 01/19/2015, 4:28 PM

## 2015-01-20 MED ORDER — DIVALPROEX SODIUM ER 500 MG PO TB24
500.0000 mg | ORAL_TABLET | Freq: Every day | ORAL | Status: DC
  Administered 2015-01-20 – 2015-01-24 (×5): 500 mg via ORAL
  Filled 2015-01-20 (×5): qty 1

## 2015-01-20 MED ORDER — HYDROXYZINE HCL 50 MG PO TABS
50.0000 mg | ORAL_TABLET | Freq: Every day | ORAL | Status: DC
  Administered 2015-01-20 – 2015-01-30 (×10): 50 mg via ORAL
  Filled 2015-01-20 (×12): qty 1

## 2015-01-20 NOTE — [facility] Group Notes (Signed)
[facility] Group Notes:  (Nursing/MHT/Case Management/Adjunct)  Date:  01/20/2015  Time:  9:45 AM  Type of Therapy:  Goal-Setting  Participation Level:  Did not attend  [name] [name] 01/20/2015, 9:45 AM 

## 2015-01-20 NOTE — Progress Notes (Signed)
Patient irritable and withdrawn in bed this am. Reports he "does not sleep well at night". No SI/HI at this time. Showered and shaved early this morning. Good appetite. Irritable about waking up to take meds. Returns to bed. Safety maintained.

## 2015-01-20 NOTE — Progress Notes (Signed)
Patient with no distress, no complaint. Remains withdrawn in bed sleepy between meals, with minimal interaction with peers. Safety maintained.

## 2015-01-20 NOTE — Progress Notes (Signed)
In room with door closed at onset of shift. Was medication compliant but did state on a few occassions that when discharged will stop taking meds to be readmitted. Denied A,V,H and HI, SI. Retreated to room for night time rest. Had an uneventful night.

## 2015-01-20 NOTE — Progress Notes (Signed)
[facility]  01/20/2015 1:10 PM Daniel MoundJaime Chavira  MRN:  [id number]  Subjective:  [name] has a long history of schizoaffective disorder. He was admitted floridly psychotic in spite of treatment with injectable antipsychotics. He is currently on Invega sustenna and Prolixin decanoate.   Patient was sleeping in his room. He has reported to nursing he's not sleeping at night, however it does appear he sleeping throughout the day that this may be contributing to insomnia at night. Patient was not interested in speaking. However he asked if he needed anything he did respond to this Clinical research associatewriter know. Principal Problem: Schizoaffective disorder, bipolar type (HCC) Diagnosis:   Patient Active Problem List   Diagnosis Date Noted  . Scrotal pain [N50.82] 01/11/2015  . Tobacco use disorder [F17.200] 12/27/2014  . HIV positive (HCC) [Z21] 12/26/2014  . Hypertension [I10] 12/26/2014  . Hypothyroidism [E03.9] 12/26/2014  . Dyslipidemia [E78.5] 12/26/2014  . Schizoaffective disorder, bipolar type (HCC) [F25.0] 10/25/2014  . Alcohol abuse [F10.10] 10/25/2014   Total Time spent with patient: 20 minutes  Past Psychiatric History: schizoaffective disorder.  Past Medical History:  Past Medical History  Diagnosis Date  . HIV (human immunodeficiency virus infection) (HCC)   . BPH (benign prostatic hypertrophy)   . Dyslipidemia   . Hypertension   . Hypothyroidism   . Schizoaffective disorder (HCC)    History reviewed. No pertinent past surgical history. Family History:  Family History  Problem Relation Age of Onset  . Hypertension Other    Family Psychiatric  History: none reported.  Social History:  History  Alcohol Use  . 1.2 - 1.8 oz/week  . 2-3 Cans of beer per week     History  Drug Use No    Social History   Social History  . Marital Status: Single    Spouse Name: N/A  . Number of Children: N/A  . Years of Education: N/A   Social History Main Topics  . Smoking status:  Current Every Day Smoker -- 2.00 packs/day    Types: Cigarettes  . Smokeless tobacco: None  . Alcohol Use: 1.2 - 1.8 oz/week    2-3 Cans of beer per week  . Drug Use: No  . Sexual Activity: Not Asked   Other Topics Concern  . None   Social History Narrative   Additional Social History:                         Sleep: Fair  Appetite:  Fair  Current Medications: Current Facility-Administered Medications  Medication Dose Route Frequency Provider Last Rate Last Dose  . acetaminophen (TYLENOL) tablet 650 mg  650 mg Oral Q6H PRN Audery AmelJohn T Clapacs, MD   650 mg at 01/16/15 0548  . alum & mag hydroxide-simeth (MAALOX/MYLANTA) 200-200-20 MG/5ML suspension 30 mL  30 mL Oral Q4H PRN Audery AmelJohn T Clapacs, MD   30 mL at 01/15/15 1837  . aspirin EC tablet 81 mg  81 mg Oral QAC breakfast Jolanta B Pucilowska, MD   81 mg at 01/20/15 1017  . benztropine (COGENTIN) tablet 2 mg  2 mg Oral QAC breakfast Jolanta B Pucilowska, MD   2 mg at 01/20/15 1016  . carvedilol (COREG) tablet 3.125 mg  3.125 mg Oral BID WC Audery AmelJohn T Clapacs, MD   3.125 mg at 01/20/15 1017  . cholecalciferol (VITAMIN D) tablet 2,000 Units  2,000 Units Oral QAC breakfast Shari ProwsJolanta B Pucilowska, MD   2,000 Units at 01/20/15 1015  .  desmopressin (DDAVP) tablet 0.2 mg  0.2 mg Oral QHS Jolanta B Pucilowska, MD   0.2 mg at 01/19/15 2153  . divalproex (DEPAKOTE ER) 24 hr tablet 500 mg  500 mg Oral QHS Jolanta B Pucilowska, MD      . efavirenz-emtricitabine-tenofovir (ATRIPLA) 600-200-300 MG per tablet 1 tablet  1 tablet Oral QHS Audery Amel, MD   1 tablet at 01/19/15 2153  . fluPHENAZine decanoate (PROLIXIN) injection 50 mg  50 mg Intramuscular Q14 Days Jolanta B Pucilowska, MD   50 mg at 01/15/15 0907  . hydrOXYzine (ATARAX/VISTARIL) tablet 50 mg  50 mg Oral QHS Jolanta B Pucilowska, MD      . ibuprofen (ADVIL,MOTRIN) tablet 600 mg  600 mg Oral Q6H PRN Jolanta B Pucilowska, MD   600 mg at 01/16/15 1356  . iohexol (OMNIPAQUE) 240 MG/ML  injection 50 mL  50 mL Oral Once PRN Medication Radiologist, MD      . levothyroxine (SYNTHROID, LEVOTHROID) tablet 25 mcg  25 mcg Oral QAC breakfast Audery Amel, MD   25 mcg at 01/20/15 0630  . lithium carbonate capsule 300 mg  300 mg Oral TID WC Jolanta B Pucilowska, MD   300 mg at 01/20/15 1227  . magnesium citrate solution 1 Bottle  1 Bottle Oral Once Shari Prows, MD   1 Bottle at 01/10/15 1432  . magnesium hydroxide (MILK OF MAGNESIA) suspension 30 mL  30 mL Oral Daily PRN Audery Amel, MD   30 mL at 01/15/15 1333  . paliperidone (INVEGA SUSTENNA) injection 234 mg  234 mg Intramuscular Q28 days Shari Prows, MD   234 mg at 01/01/15 0943  . promethazine (PHENERGAN) tablet 25 mg  25 mg Oral Q6H PRN Shari Prows, MD   25 mg at 01/15/15 1837  . simvastatin (ZOCOR) tablet 20 mg  20 mg Oral q1800 Audery Amel, MD   20 mg at 01/19/15 1657  . tamsulosin (FLOMAX) capsule 0.4 mg  0.4 mg Oral QPC supper Audery Amel, MD   0.4 mg at 01/19/15 1656  . terazosin (HYTRIN) capsule 1 mg  1 mg Oral QHS Audery Amel, MD   1 mg at 01/19/15 2153  . traZODone (DESYREL) tablet 300 mg  300 mg Oral QHS Jolanta B Pucilowska, MD   300 mg at 01/19/15 2153  . zolpidem (AMBIEN) tablet 10 mg  10 mg Oral QHS PRN Shari Prows, MD   10 mg at 01/18/15 2244    Lab Results:  No results found for this or any previous visit (from the past 48 hour(s)).  Physical Findings: AIMS:  , ,  ,  ,    CIWA:    COWS:     Musculoskeletal: Strength & Muscle Tone: within normal limits Gait & Station: normal Patient leans: N/A  Psychiatric Specialty Exam: Review of Systems  All other systems reviewed and are negative.   Blood pressure 108/74, pulse 72, temperature 97.6 F (36.4 C), temperature source Oral, resp. rate 20, height 6' (1.829 m), weight 79.379 kg (175 lb), SpO2 100 %.Body mass index is 23.73 kg/(m^2).  General Appearance: Disheveled  Eye Contact::  Minimal  Speech:  Slurred   Volume:  Decreased  Mood:  Depressed  Affect:  Labile  Thought Process:  Disorganized  Orientation:  Full (Time, Place, and Person)  Thought Content:  Delusions and Paranoid Ideation  Suicidal Thoughts:  No  Homicidal Thoughts:  No  Memory:  Immediate;   Fair Recent;  Fair Remote;   Fair  Judgement:  Impaired  Insight:  Lacking  Psychomotor Activity:  Decreased  Concentration:  Fair  Recall:  [employer] of Knowledge:Fair  Language: Fair  Akathisia:  No  Handed:  Right  AIMS (if indicated):     Assets:  Communication Skills  ADL's:  Intact  Cognition: WNL  Sleep:  Number of Hours: 4.25   Treatment Plan Summary: Daily contact with patient to assess and evaluate symptoms and progress in treatment and Medication management   [name] is a 54 year old male with long history of schizoaffective disorder admitted for agitated and threatening behavior at the group home in the context of partial medication compliance.  1. Agitation. [name] is calm today.    2. Mood and psychosis. He is maintained on injections of Prolixin and Invega sustenna for psychosis. We will start tapering oral antipsychotics. We continue Depakote at 1000 mg at night and lithium 300 mg tid for mood stabilization. Will check Li and VPA, ammonia level on Friday. He has a history of high ammonia on Depakote. He is still delusional, paranoid and disorganized in addition to agitation.   3. Hypothyroidism. Continue Synthroid. TSH is normal.  4. HIV. Continue Atripla.  5. Hypertension. Continue antihypertensives. VS well controlled  6. Dyslipidemia. Continue Zocor. Lipid panel is not elevated.  7. BPH. We will continue Flomax and Terazosin.  8. Social. The patient is not allowed to return to his group home. Currently homeless. Unable to care for self without supervision.  9. Insomnia. He slept only 3 hours on a combination of high dose Restoril and Trazodone. Will start Ambien tonight.  10.  Constipation. He is on bowel regimen.   11. Inguinal hernia. Medicine input is greatly appreciated. He complains of pain not eased with Tylenol. We started Motrin.   12. Vomiting. Resolved.   13. Urinary incontinence. We started DDAVP.   14. Disposition. To be established. He can not be placed as he was refused PASSR. He is on wait list for [facility].   Wallace Going 01/20/2015, 1:10 PM

## 2015-01-20 NOTE — [facility] Group Notes (Signed)
[facility] LCSW Group Therapy  01/20/2015 6:34 PM  Type of Therapy:  Group Therapy  Participation Level:  Did Not Attend  Modes of Intervention:  Discussion, Education, Socialization and Support  Summary of Progress/Problems: Todays topic: Grudges  Patients will be encouraged to discuss their thoughts, feelings, and behaviors as to why one holds on to grudges and reasons why people have grudges. Patients will process the impact of grudges on their daily lives and identify thoughts and feelings related to holding grudges. Patients will identify feelings and thoughts related to what life would look like without grudges.   Dmonte Maher L Nashya Garlington MSW, LCSWA  01/20/2015, 6:34 PM 

## 2015-01-21 MED ORDER — LORAZEPAM 2 MG PO TABS
2.0000 mg | ORAL_TABLET | Freq: Every day | ORAL | Status: DC
  Administered 2015-01-21 – 2015-01-30 (×10): 2 mg via ORAL
  Filled 2015-01-21 (×10): qty 1

## 2015-01-21 NOTE — Plan of Care (Signed)
Problem: Alteration in mood Goal: STG-Patient is able to discuss feelings and issues (Patient is able to discuss feelings and issues leading to depression)  Outcome: Not Progressing Does not want to listen to staff.

## 2015-01-21 NOTE — Progress Notes (Signed)
Recreation Therapy Notes  Date: 10.31.16 Time: 3:00 pm Location: Craft Room  Group Topic: Wellness  Goal Area(s) Addresses:  Patient will identify at least one item per dimension of health. Patient will examine areas they are deficient in.  Behavioral Response: Attentive, Left early  Intervention: 6 Dimensions of Health  Activity: Patients were given a worksheet with the definitions of the 6 dimensions of health. Patients were given a second worksheet with the 6 dimensions of health on it and instructed to write out at least one item they are currently doing in each dimension.  Education: LRT educated patients on way they can improve each dimension.  Education Outcome: Patient left before LRT educated group.   Clinical Observations/Feedback: Patient colored the worksheet instead of writing things he is doing in each dimension. Patient did not contribute to group discussion. Patient left group at approximately 3:30 pm. Patient did not return to group.  Jacquelynn CreeGreene,Wylan Gentzler M, LRT/CTRS 01/21/2015 4:01 PM

## 2015-01-21 NOTE — Progress Notes (Signed)
He is still loud & argumentative.Compliant with medications.States "you all are trying to kill me with medicines."Comes to nurses station with letters every now & then.Did not attend groups.He was arguing with another patient for taking the phone.

## 2015-01-21 NOTE — Progress Notes (Signed)
In room resting at onset of shift. Did have an incident of urine incontinence. Clean bedding provided. Suggestion made to toilet self every 2-3 hours and before sleep. Patient became aggressive and began to yell. Asked to leave TV room if being loud and interrupting others. Did go to room. Was medication compliant. Denied SI, HI, AVH. Retreated to room for PM rest and had an uneventful night.

## 2015-01-21 NOTE — Progress Notes (Signed)
[facility] MD Progress Note  01/21/2015 2:48 PM Wille Aubuchon  MRN:  [id number]  Subjective:  Mr. Baeten is still loud, demanding, intrusive, and argumentative. He demands to be discharge to a boarding house immediately but no sign of a house was identified. He is on the wait list for [facility]. He refuses are normal but appears much better now. There are no problems with speech, balance, or confusion. He still writes me long letters. He is very somatic complaining of abdominal pain that he still believes is called by lithium. There is some program participation   Principal Problem: Schizoaffective disorder, bipolar type (HCC) Diagnosis:   Patient Active Problem List   Diagnosis Date Noted  . Scrotal pain [N50.82] 01/11/2015  . Tobacco use disorder [F17.200] 12/27/2014  . HIV positive (HCC) [Z21] 12/26/2014  . Hypertension [I10] 12/26/2014  . Hypothyroidism [E03.9] 12/26/2014  . Dyslipidemia [E78.5] 12/26/2014  . Schizoaffective disorder, bipolar type (HCC) [F25.0] 10/25/2014  . Alcohol abuse [F10.10] 10/25/2014   Total Time spent with patient: 20 minutes  Past Psychiatric History: Schizoaffective disorder bipolar type  Past Medical History:  Past Medical History  Diagnosis Date  . HIV (human immunodeficiency virus infection) (HCC)   . BPH (benign prostatic hypertrophy)   . Dyslipidemia   . Hypertension   . Hypothyroidism   . Schizoaffective disorder (HCC)    History reviewed. No pertinent past surgical history. Family History:  Family History  Problem Relation Age of Onset  . Hypertension Other    Family Psychiatric  History: None reported. Social History:  History  Alcohol Use  . 1.2 - 1.8 oz/week  . 2-3 Cans of beer per week     History  Drug Use No    Social History   Social History  . Marital Status: Single    Spouse Name: N/A  . Number of Children: N/A  . Years of Education: N/A   Social History Main Topics  . Smoking status: Current Every Day  Smoker -- 2.00 packs/day    Types: Cigarettes  . Smokeless tobacco: None  . Alcohol Use: 1.2 - 1.8 oz/week    2-3 Cans of beer per week  . Drug Use: No  . Sexual Activity: Not Asked   Other Topics Concern  . None   Social History Narrative   Additional Social History:                         Sleep: Fair  Appetite:  Fair  Current Medications: Current Facility-Administered Medications  Medication Dose Route Frequency Provider Last Rate Last Dose  . acetaminophen (TYLENOL) tablet 650 mg  650 mg Oral Q6H PRN Audery Amel, MD   650 mg at 01/16/15 0548  . alum & mag hydroxide-simeth (MAALOX/MYLANTA) 200-200-20 MG/5ML suspension 30 mL  30 mL Oral Q4H PRN Audery Amel, MD   30 mL at 01/15/15 1837  . aspirin EC tablet 81 mg  81 mg Oral QAC breakfast Shari Prows, MD   81 mg at 01/21/15 0805  . benztropine (COGENTIN) tablet 2 mg  2 mg Oral QAC breakfast Shari Prows, MD   2 mg at 01/21/15 0806  . carvedilol (COREG) tablet 3.125 mg  3.125 mg Oral BID WC Audery Amel, MD   3.125 mg at 01/21/15 0806  . cholecalciferol (VITAMIN D) tablet 2,000 Units  2,000 Units Oral QAC breakfast Shari Prows, MD   2,000 Units at 01/21/15 0805  . desmopressin (  DDAVP) tablet 0.2 mg  0.2 mg Oral QHS Shari Prows, MD   0.2 mg at 01/20/15 2146  . divalproex (DEPAKOTE ER) 24 hr tablet 500 mg  500 mg Oral QHS Shari Prows, MD   500 mg at 01/20/15 2146  . efavirenz-emtricitabine-tenofovir (ATRIPLA) 600-200-300 MG per tablet 1 tablet  1 tablet Oral QHS Audery Amel, MD   1 tablet at 01/20/15 2146  . fluPHENAZine decanoate (PROLIXIN) injection 50 mg  50 mg Intramuscular Q14 Days Yavuz Kirby B Barnell Shieh, MD   50 mg at 01/15/15 0907  . hydrOXYzine (ATARAX/VISTARIL) tablet 50 mg  50 mg Oral QHS Shari Prows, MD   50 mg at 01/20/15 2146  . ibuprofen (ADVIL,MOTRIN) tablet 600 mg  600 mg Oral Q6H PRN Karanveer Ramakrishnan B Shelsea Hangartner, MD   600 mg at 01/16/15 1356  . iohexol  (OMNIPAQUE) 240 MG/ML injection 50 mL  50 mL Oral Once PRN Medication Radiologist, MD      . levothyroxine (SYNTHROID, LEVOTHROID) tablet 25 mcg  25 mcg Oral QAC breakfast Audery Amel, MD   25 mcg at 01/21/15 0630  . lithium carbonate capsule 300 mg  300 mg Oral TID WC Ronetta Molla B Joshlynn Alfonzo, MD   300 mg at 01/21/15 1227  . magnesium citrate solution 1 Bottle  1 Bottle Oral Once Shari Prows, MD   1 Bottle at 01/10/15 1432  . magnesium hydroxide (MILK OF MAGNESIA) suspension 30 mL  30 mL Oral Daily PRN Audery Amel, MD   30 mL at 01/15/15 1333  . paliperidone (INVEGA SUSTENNA) injection 234 mg  234 mg Intramuscular Q28 days Shari Prows, MD   234 mg at 01/01/15 0943  . promethazine (PHENERGAN) tablet 25 mg  25 mg Oral Q6H PRN Shari Prows, MD   25 mg at 01/15/15 1837  . simvastatin (ZOCOR) tablet 20 mg  20 mg Oral q1800 Audery Amel, MD   20 mg at 01/20/15 1635  . tamsulosin (FLOMAX) capsule 0.4 mg  0.4 mg Oral QPC supper Audery Amel, MD   0.4 mg at 01/20/15 1637  . terazosin (HYTRIN) capsule 1 mg  1 mg Oral QHS Audery Amel, MD   1 mg at 01/20/15 2146  . traZODone (DESYREL) tablet 300 mg  300 mg Oral QHS Shari Prows, MD   300 mg at 01/20/15 2146  . zolpidem (AMBIEN) tablet 10 mg  10 mg Oral QHS PRN Shari Prows, MD   10 mg at 01/20/15 2145    Lab Results: No results found for this or any previous visit (from the past 48 hour(s)).  Physical Findings: AIMS:  , ,  ,  ,    CIWA:    COWS:     Musculoskeletal: Strength & Muscle Tone: within normal limits Gait & Station: normal Patient leans: N/A  Psychiatric Specialty Exam: Review of Systems  Gastrointestinal: Positive for abdominal pain.  All other systems reviewed and are negative.   Blood pressure 118/84, pulse 70, temperature 98.1 F (36.7 C), temperature source Oral, resp. rate 20, height 6' (1.829 m), weight 79.379 kg (175 lb), SpO2 100 %.Body mass index is 23.73 kg/(m^2).  General  Appearance: Disheveled  Eye Contact::  Good  Speech:  Pressured  Volume:  Increased  Mood:  Angry, Dysphoric and Irritable  Affect:  Labile  Thought Process:  Disorganized  Orientation:  Full (Time, Place, and Person)  Thought Content:  Delusions and Paranoid Ideation  Suicidal Thoughts:  No  Homicidal Thoughts:  No  Memory:  Immediate;   Fair Recent;   Fair Remote;   Fair  Judgement:  Impaired  Insight:  Shallow  Psychomotor Activity:  Increased  Concentration:  Fair  Recall:  [employer]  Fund of Knowledge:Fair  Language: Fair  Akathisia:  No  Handed:  Right  AIMS (if indicated):     Assets:  Communication Skills Desire for Improvement Financial Resources/Insurance Physical Health Resilience Social Support  ADL's:  Intact  Cognition: WNL  Sleep:  Number of Hours: 5   Treatment Plan Summary: Daily contact with patient to assess and evaluate symptoms and progress in treatment and Medication management   [name] is a 54 year old male with long history of schizoaffective disorder admitted for agitated and threatening behavior at the group home in the context of partial medication compliance.  1. Agitation. [name] is calm today.   2. Mood and psychosis. He is maintained on injections of Prolixin and Invega sustenna for psychosis. We tapered off oral antipsychotics. We continue Depakote at 1000 mg at night and lithium 300 mg tid for mood stabilization. He has a history of high ammonia on Depakote. He is still delusional, paranoid and disorganized in addition to agitation.   3. Hypothyroidism. Continue Synthroid. TSH is normal.  4. HIV. Continue Atripla.  5. Hypertension. Continue antihypertensives. VS well controlled  6. Dyslipidemia. Continue Zocor. Lipid panel is not elevated.  7. BPH. We will continue Flomax and Terazosin.  8. Social. The patient is not allowed to return to his group home. Currently homeless. Unable to care for self without supervision.  9.  Insomnia. Sleep continues to be poor spite of treatment with Restoril, Ambien and Trazodone. Will offer Ativan tonight.   10. Constipation. He is on bowel regimen.   11. Inguinal hernia. Medicine input is greatly appreciated. He complains of pain not eased with Tylenol. We started Motrin.   12. Vomiting. Resolved.   13. Urinary incontinence. We started DDAVP.   14. Disposition. To be established. He can not be placed as he was refused PASSR. He is on wait list for [facility].    Lorin Gawron 01/21/2015, 2:48 PM

## 2015-01-21 NOTE — [facility] Group Notes (Signed)
[facility] LCSW Group Therapy  01/21/2015 3:30 PM  Type of Therapy:  Group Therapy  Participation Level:  Active  Participation Quality:  Appropriate and Attentive  Affect:  Appropriate  Cognitive:  Alert, Appropriate and Oriented  Insight:  Engaged  Engagement in Therapy:  Engaged  Modes of Intervention:  Discussion, Socialization and Support  Summary of Progress/Problems: Patient attended and participated in group discussion appropriately. Patient introduced himself and participated in introductions appropriately. Patient shared that he has experienced anxiety and worried about coming to the hospital repeatedly and reports he did not plan to come to a psychiatric unit for this long of a period of time.    Lulu RidingIngle, Lorann Tani T, MSW, LCSWA 01/21/2015, 3:30 PM

## 2015-01-22 NOTE — Progress Notes (Signed)
Recreation Therapy Notes  Date: 11.01.16 Time: 3:00 pm Location: Craft Room  Group Topic: Self-expression  Goal Area(s) Addresses:  Patient will pick one color for each emotion listed. Patient will show where they feel that emotion on the worksheet.  Behavioral Response: Did not attend  Intervention: Color Your Emotions  Activity: Patients were given a human body worksheet and instructed to pick a color for 5 different emotions and show on the worksheet where they feel those emotions.  Education: LRT educated patients on different forms of self-expression.  Education Outcome: Patient did not attend group.  Clinical Observations/Feedback: Patient did not attend group.  Jacquelynn CreeGreene,Shenia Alan M, LRT/CTRS 01/22/2015 4:10 PM

## 2015-01-22 NOTE — Progress Notes (Signed)
D: Patient Slept this am shift , but was out of bed this afternoon . Continued to do his witting . Questioning writer about his medication , but compliant . Patient stated he wanted to get out of here . Stated to Clinical research associatewriter " Tell that doctor I'm gon na  kill her got- damm ass if she don't let me out  I'm gon na kill her "   Patient was incontinent of urine this am shift . No unit programing . Patient was up for meals.  A: Instructed on medication verbalizing understanding. Encourage patient participation and to come to staff for any concerns. R. Patient voice no other concerns . Continue to monitor

## 2015-01-22 NOTE — Progress Notes (Signed)
[facility] MD Progress Note  01/22/2015 1:57 PM [name]  MRN:  [id number]  Subjective:  [name] reports that he feels very well and wants to be discharged immediately so he can come back to the hospital in 2 days. He would like to have his own place with a TV. He realizes however that he is not stable to live independently. This is) moment of clarity of his thinking. He is still loud intrusive, disorganized, demanding. He is disruptive in groups. He does not have any somatic complaints today. She tells me that he still wets his bed in spite of DDAVP. He is aware that we are waiting to send him to [facility] for further treatment and stabilization.  Principal Problem: Schizoaffective disorder, bipolar type (HCC) Diagnosis:   Patient Active Problem List   Diagnosis Date Noted  . Scrotal pain [N50.82] 01/11/2015  . Tobacco use disorder [F17.200] 12/27/2014  . HIV positive (HCC) [Z21] 12/26/2014  . Hypertension [I10] 12/26/2014  . Hypothyroidism [E03.9] 12/26/2014  . Dyslipidemia [E78.5] 12/26/2014  . Schizoaffective disorder, bipolar type (HCC) [F25.0] 10/25/2014  . Alcohol abuse [F10.10] 10/25/2014   Total Time spent with patient: 20 minutes  Past Psychiatric History: Schizoaffective disorder bipolar type  Past Medical History:  Past Medical History  Diagnosis Date  . HIV (human immunodeficiency virus infection) (HCC)   . BPH (benign prostatic hypertrophy)   . Dyslipidemia   . Hypertension   . Hypothyroidism   . Schizoaffective disorder (HCC)    History reviewed. No pertinent past surgical history. Family History:  Family History  Problem Relation Age of Onset  . Hypertension Other    Family Psychiatric  History: None reported Social History:  History  Alcohol Use  . 1.2 - 1.8 oz/week  . 2-3 Cans of beer per week     History  Drug Use No    Social History   Social History  . Marital Status: Single    Spouse Name: N/A  . Number of Children: N/A  .  Years of Education: N/A   Social History Main Topics  . Smoking status: Current Every Day Smoker -- 2.00 packs/day    Types: Cigarettes  . Smokeless tobacco: None  . Alcohol Use: 1.2 - 1.8 oz/week    2-3 Cans of beer per week  . Drug Use: No  . Sexual Activity: Not Asked   Other Topics Concern  . None   Social History Narrative   Additional Social History:                         Sleep: Good  Appetite:  Good  Current Medications: Current Facility-Administered Medications  Medication Dose Route Frequency Provider Last Rate Last Dose  . acetaminophen (TYLENOL) tablet 650 mg  650 mg Oral Q6H PRN Audery Amel, MD   650 mg at 01/16/15 0548  . alum & mag hydroxide-simeth (MAALOX/MYLANTA) 200-200-20 MG/5ML suspension 30 mL  30 mL Oral Q4H PRN Audery Amel, MD   30 mL at 01/15/15 1837  . aspirin EC tablet 81 mg  81 mg Oral QAC breakfast Shari Prows, MD   81 mg at 01/22/15 0838  . benztropine (COGENTIN) tablet 2 mg  2 mg Oral QAC breakfast Shari Prows, MD   2 mg at 01/22/15 0836  . carvedilol (COREG) tablet 3.125 mg  3.125 mg Oral BID WC Audery Amel, MD   3.125 mg at 01/22/15 0836  . cholecalciferol (VITAMIN  D) tablet 2,000 Units  2,000 Units Oral QAC breakfast Shari Prows, MD   2,000 Units at 01/22/15 0835  . desmopressin (DDAVP) tablet 0.2 mg  0.2 mg Oral QHS Shenelle Klas B Tiaira Arambula, MD   0.2 mg at 01/21/15 2115  . divalproex (DEPAKOTE ER) 24 hr tablet 500 mg  500 mg Oral QHS Shari Prows, MD   500 mg at 01/21/15 2112  . efavirenz-emtricitabine-tenofovir (ATRIPLA) 600-200-300 MG per tablet 1 tablet  1 tablet Oral QHS Audery Amel, MD   1 tablet at 01/21/15 2113  . fluPHENAZine decanoate (PROLIXIN) injection 50 mg  50 mg Intramuscular Q14 Days Vere Diantonio B Dublin Cantero, MD   50 mg at 01/15/15 0907  . hydrOXYzine (ATARAX/VISTARIL) tablet 50 mg  50 mg Oral QHS Natori Gudino B Grayling Schranz, MD   50 mg at 01/21/15 2113  . ibuprofen (ADVIL,MOTRIN) tablet  600 mg  600 mg Oral Q6H PRN Malak Orantes B Sharlene Mccluskey, MD   600 mg at 01/16/15 1356  . iohexol (OMNIPAQUE) 240 MG/ML injection 50 mL  50 mL Oral Once PRN Medication Radiologist, MD      . levothyroxine (SYNTHROID, LEVOTHROID) tablet 25 mcg  25 mcg Oral QAC breakfast Audery Amel, MD   25 mcg at 01/22/15 0837  . lithium carbonate capsule 300 mg  300 mg Oral TID WC Cherine Drumgoole B Kadence Mimbs, MD   300 mg at 01/22/15 1226  . LORazepam (ATIVAN) tablet 2 mg  2 mg Oral QHS Shari Prows, MD   2 mg at 01/21/15 2112  . magnesium citrate solution 1 Bottle  1 Bottle Oral Once Shari Prows, MD   1 Bottle at 01/10/15 1432  . magnesium hydroxide (MILK OF MAGNESIA) suspension 30 mL  30 mL Oral Daily PRN Audery Amel, MD   30 mL at 01/21/15 1743  . paliperidone (INVEGA SUSTENNA) injection 234 mg  234 mg Intramuscular Q28 days Shari Prows, MD   234 mg at 01/01/15 0943  . promethazine (PHENERGAN) tablet 25 mg  25 mg Oral Q6H PRN Shari Prows, MD   25 mg at 01/15/15 1837  . simvastatin (ZOCOR) tablet 20 mg  20 mg Oral q1800 Audery Amel, MD   20 mg at 01/21/15 1732  . tamsulosin (FLOMAX) capsule 0.4 mg  0.4 mg Oral QPC supper Audery Amel, MD   0.4 mg at 01/21/15 1732  . terazosin (HYTRIN) capsule 1 mg  1 mg Oral QHS Audery Amel, MD   1 mg at 01/21/15 2112  . traZODone (DESYREL) tablet 300 mg  300 mg Oral QHS Shari Prows, MD   300 mg at 01/21/15 2112    Lab Results: No results found for this or any previous visit (from the past 48 hour(s)).  Physical Findings: AIMS:  , ,  ,  ,    CIWA:    COWS:     Musculoskeletal: Strength & Muscle Tone: within normal limits Gait & Station: normal Patient leans: N/A  Psychiatric Specialty Exam: Review of Systems  Genitourinary: Positive for frequency.  All other systems reviewed and are negative.   Blood pressure 112/76, pulse 88, temperature 97 F (36.1 C), temperature source Oral, resp. rate 18, height 6' (1.829 m), weight  79.379 kg (175 lb), SpO2 100 %.Body mass index is 23.73 kg/(m^2).  General Appearance: Disheveled  Eye Contact::  Fair  Speech:  Garbled and Pressured  Volume:  Increased  Mood:  Angry and Dysphoric  Affect:  Labile  Thought Process:  Disorganized  Orientation:  Full (Time, Place, and Person)  Thought Content:  WDL and Paranoid Ideation  Suicidal Thoughts:  No  Homicidal Thoughts:  No  Memory:  Immediate;   Fair Recent;   Fair Remote;   Fair  Judgement:  Impaired  Insight:  Shallow  Psychomotor Activity:  Increased  Concentration:  Fair  Recall:  [employer]  Fund of Knowledge:Fair  Language: Fair  Akathisia:  No  Handed:  Right  AIMS (if indicated):     Assets:  Communication Skills Desire for Improvement Financial Resources/Insurance Physical Health Resilience  ADL's:  Intact  Cognition: WNL  Sleep:  Number of Hours: 7   Treatment Plan Summary: Daily contact with patient to assess and evaluate symptoms and progress in treatment and Medication management   [name] is a 54 year old male with long history of schizoaffective disorder admitted for agitated and threatening behavior at the group home in the context of partial medication compliance.  1. Agitation. [name] is calm today.   2. Mood and psychosis. He is maintained on injections of Prolixin and Invega sustenna for psychosis. We tapered off oral antipsychotics. We continue Depakote at 1000 mg at night and lithium 300 mg tid for mood stabilization. He has a history of high ammonia on Depakote. He is still delusional, paranoid and disorganized in addition to agitation.   3. Hypothyroidism. Continue Synthroid. TSH is normal.  4. HIV. Continue Atripla.  5. Hypertension. Continue antihypertensives. VS well controlled  6. Dyslipidemia. Continue Zocor. Lipid panel is not elevated.  7. BPH. We will continue Flomax and Terazosin.  8. Social. The patient is not allowed to return to his group home. Currently homeless.  Unable to care for self without supervision.  9. Insomnia. Sleep continues to be poor spite of treatment with Restoril, Ambien and Trazodone. Will offer Ativan tonight.   10. Constipation. He is on bowel regimen.   11. Inguinal hernia. Medicine input is greatly appreciated. He complains of pain not eased with Tylenol. We started Motrin.   12. Vomiting. Resolved.   13. Urinary incontinence. We started DDAVP.   14. Disposition. To be established. He can not be placed as he was refused PASSR. He is on wait list for [facility].   No medication adjustments were offered today. Patient is improving very slowly.  Harris Penton 01/22/2015, 1:57 PM

## 2015-01-22 NOTE — Progress Notes (Signed)
A&Ox3, visible in the milieu, poorly kept and disheveled appearance, became irritable when he was asked about disposition, communicating threat, "I will kill somebody if you send me to a group home ..." Will continue to monitor closely for safety.

## 2015-01-22 NOTE — Progress Notes (Signed)
Patient is demanding and intrusive. Patient is agitated and verbally abusive and dismissive. The patient denies SI/HI and hallucinations but then states that he will kill himself tomorrow so that he will be discharged. Patient was informed that unsafe behaviors will not decrease his length of stay. Patient then stated that he just wants to be discharged to the streets. The patient is medication compliant. The patient is currently resting in his room. No safe behavior noted. Will continue to monitor. Q 15 min checks maintained.

## 2015-01-22 NOTE — Plan of Care (Signed)
Problem: Alteration in mood Goal: LTG-Patient reports reduction in suicidal thoughts (Patient reports reduction in suicidal thoughts and is able to verbalize a safety plan for whenever patient is feeling suicidal)  Outcome: Not Progressing Patient states that he will kill himself tomorrow so that he can be discharged.

## 2015-01-23 NOTE — [facility] Group Notes (Signed)
[facility]   01/23/2015 12:40 PM  Participation Quality:  Did not attend.   Laurin Morgenstern L Kenzi Bardwell MSW, [id number]CSWA

## 2015-01-23 NOTE — Plan of Care (Signed)
Problem: Consults Goal: Aggression Patient Education See Patient Education Module for education specifics.  Outcome: Not Progressing Name calling , threat staff.

## 2015-01-23 NOTE — Progress Notes (Signed)
Perseverating, loud, disruptive, communicating threat (reaction Formative statement) "I am going to kill that Dr.! I want to be discharged now, I made Gwen worked for her 12 full hours and I am going to do the same to you. If Dr. Demetrius CharityP discharged me, I will be back in 6 months, I swear! [facility] .Marland Kitchen.Marland Kitchen."

## 2015-01-23 NOTE — Progress Notes (Signed)
Patient ID: [name], male   DOB: [birth year], 54 y.o.   MRN: [id number]  [facility]'s [facility] against the doctor.   Daisy FloroCandace L Linh Hedberg MSW, LCSWA  01/23/2015 4:24 PM

## 2015-01-23 NOTE — Progress Notes (Signed)
[facility] MD Progress Note  01/23/2015 2:22 PM [name]  MRN:  [id number]  Subjective:  [name] will contact me today with "read my letter bitch". In the 2 days his sleep and that they're under my door as she does every day. Today he says that he will kill me because I don't want to let him out of the hospital. He hopes to go to jail for killing me and to prison later. He is now, intrusive, argumentative. He refuses medication today because he believes they make his abdominal pain and hernia worse. Clearly our therapeutic relationship has been broken. I hope that the patient will be [facility]  Principal Problem: Schizoaffective disorder, bipolar type [facility]) Diagnosis:   Patient Active Problem List   Diagnosis Date Noted  . Scrotal pain [N50.82] 01/11/2015  . Tobacco use disorder [F17.200] 12/27/2014  . HIV positive (HCC) [Z21] 12/26/2014  . Hypertension [I10] 12/26/2014  . Hypothyroidism [E03.9] 12/26/2014  . Dyslipidemia [E78.5] 12/26/2014  . Schizoaffective disorder, bipolar type (HCC) [F25.0] 10/25/2014  . Alcohol abuse [F10.10] 10/25/2014   Total Time spent with patient: 15 minutes  Past Psychiatric History: Schizoaffective disorder.  Past Medical History:  Past Medical History  Diagnosis Date  . HIV (human immunodeficiency virus infection) (HCC)   . BPH (benign prostatic hypertrophy)   . Dyslipidemia   . Hypertension   . Hypothyroidism   . Schizoaffective disorder (HCC)    History reviewed. No pertinent past surgical history. Family History:  Family History  Problem Relation Age of Onset  . Hypertension Other    Family Psychiatric  History: Aunt with schizophrenia. Social History:  History  Alcohol Use  . 1.2 - 1.8 oz/week  . 2-3 Cans of beer per week     History  Drug Use No    Social History   Social History  . Marital Status: Single    Spouse Name: N/A  . Number of Children: N/A  . Years of Education: N/A   Social  History Main Topics  . Smoking status: Current Every Day Smoker -- 2.00 packs/day    Types: Cigarettes  . Smokeless tobacco: None  . Alcohol Use: 1.2 - 1.8 oz/week    2-3 Cans of beer per week  . Drug Use: No  . Sexual Activity: Not Asked   Other Topics Concern  . None   Social History Narrative   Additional Social History:                         Sleep: Good  Appetite:  Good  Current Medications: Current Facility-Administered Medications  Medication Dose Route Frequency Provider Last Rate Last Dose  . acetaminophen (TYLENOL) tablet 650 mg  650 mg Oral Q6H PRN Audery Amel, MD   650 mg at 01/16/15 0548  . alum & mag hydroxide-simeth (MAALOX/MYLANTA) 200-200-20 MG/5ML suspension 30 mL  30 mL Oral Q4H PRN Audery Amel, MD   30 mL at 01/15/15 1837  . aspirin EC tablet 81 mg  81 mg Oral QAC breakfast Shari Prows, MD   81 mg at 01/23/15 0752  . benztropine (COGENTIN) tablet 2 mg  2 mg Oral QAC breakfast Shari Prows, MD   2 mg at 01/23/15 0752  . carvedilol (COREG) tablet 3.125 mg  3.125 mg Oral BID WC Audery Amel, MD   3.125 mg at 01/22/15 1639  . cholecalciferol (VITAMIN D) tablet 2,000 Units  2,000 Units Oral  QAC breakfast Shari Prows, MD   2,000 Units at 01/23/15 0752  . desmopressin (DDAVP) tablet 0.2 mg  0.2 mg Oral QHS Christopher Glasscock B Laveyah Oriol, MD   0.2 mg at 01/22/15 2111  . divalproex (DEPAKOTE ER) 24 hr tablet 500 mg  500 mg Oral QHS Jabria Loos B Taariq Leitz, MD   500 mg at 01/22/15 2111  . efavirenz-emtricitabine-tenofovir (ATRIPLA) 600-200-300 MG per tablet 1 tablet  1 tablet Oral QHS Audery Amel, MD   1 tablet at 01/22/15 2111  . fluPHENAZine decanoate (PROLIXIN) injection 50 mg  50 mg Intramuscular Q14 Days Dangela How B Yakira Duquette, MD   50 mg at 01/15/15 0907  . hydrOXYzine (ATARAX/VISTARIL) tablet 50 mg  50 mg Oral QHS Nataki Mccrumb B Shadd Dunstan, MD   50 mg at 01/22/15 2111  . ibuprofen (ADVIL,MOTRIN) tablet 600 mg  600 mg Oral Q6H PRN Sylvi Rybolt  B Dinisha Cai, MD   600 mg at 01/16/15 1356  . iohexol (OMNIPAQUE) 240 MG/ML injection 50 mL  50 mL Oral Once PRN Medication Radiologist, MD      . levothyroxine (SYNTHROID, LEVOTHROID) tablet 25 mcg  25 mcg Oral QAC breakfast Audery Amel, MD   25 mcg at 01/23/15 0644  . lithium carbonate capsule 300 mg  300 mg Oral TID WC Teddie Mehta B Christyan Reger, MD   300 mg at 01/23/15 1227  . LORazepam (ATIVAN) tablet 2 mg  2 mg Oral QHS Shari Prows, MD   2 mg at 01/22/15 2110  . magnesium citrate solution 1 Bottle  1 Bottle Oral Once Shari Prows, MD   1 Bottle at 01/10/15 1432  . magnesium hydroxide (MILK OF MAGNESIA) suspension 30 mL  30 mL Oral Daily PRN Audery Amel, MD   30 mL at 01/21/15 1743  . paliperidone (INVEGA SUSTENNA) injection 234 mg  234 mg Intramuscular Q28 days Shari Prows, MD   234 mg at 01/01/15 0943  . promethazine (PHENERGAN) tablet 25 mg  25 mg Oral Q6H PRN Shari Prows, MD   25 mg at 01/15/15 1837  . simvastatin (ZOCOR) tablet 20 mg  20 mg Oral q1800 Audery Amel, MD   20 mg at 01/22/15 1723  . tamsulosin (FLOMAX) capsule 0.4 mg  0.4 mg Oral QPC supper Audery Amel, MD   0.4 mg at 01/22/15 1723  . terazosin (HYTRIN) capsule 1 mg  1 mg Oral QHS Audery Amel, MD   1 mg at 01/22/15 2110  . traZODone (DESYREL) tablet 300 mg  300 mg Oral QHS Shari Prows, MD   300 mg at 01/22/15 2111    Lab Results: No results found for this or any previous visit (from the past 48 hour(s)).  Physical Findings: AIMS:  , ,  ,  ,    CIWA:    COWS:     Musculoskeletal: Strength & Muscle Tone: within normal limits Gait & Station: normal Patient leans: N/A  Psychiatric Specialty Exam: Review of Systems  Gastrointestinal: Positive for abdominal pain.  All other systems reviewed and are negative.   Blood pressure 84/53, pulse 72, temperature 97.9 F (36.6 C), temperature source Other (Comment), resp. rate 18, height 6' (1.829 m), weight 79.379 kg (175  lb), SpO2 98 %.Body mass index is 23.73 kg/(m^2).  General Appearance: Disheveled  Eye Contact::  Minimal  Speech:  Pressured and Slurred  Volume:  Increased  Mood:  Angry and Irritable  Affect:  Congruent  Thought Process:  Goal Directed  Orientation:  Full (Time, Place, and Person)  Thought Content:  Delusions and Paranoid Ideation  Suicidal Thoughts:  No  Homicidal Thoughts:  Yes.  with intent/plan  Memory:  Immediate;   Fair Recent;   Fair Remote;   Fair  Judgement:  Fair  Insight:  Fair  Psychomotor Activity:  EPS  Concentration:  Fair  Recall:  [employer]Fair  Fund of Knowledge:Fair  Language: Fair  Akathisia:  No  Handed:  Right  AIMS (if indicated):     Assets:  Communication Skills Desire for Improvement Financial Resources/Insurance Resilience  ADL's:  Intact  Cognition: WNL  Sleep:  Number of Hours: 7   Treatment Plan Summary: Daily contact with patient to assess and evaluate symptoms and progress in treatment and Medication management   [name] is a 54 year old male with long history of schizoaffective disorder admitted for agitated and threatening behavior at the group home in the context of partial medication compliance.  1. Agitation. [name] is agitated and threatening his physician.    2. Mood and psychosis. He is maintained on injections of Prolixin and Invega sustenna for psychosis. We tapered off oral antipsychotics. We continue Depakote at 1000 mg at night and lithium 300 mg tid for mood stabilization. He has a history of high ammonia on Depakote. He is still delusional, paranoid and disorganized in addition to agitation.   3. Hypothyroidism. Continue Synthroid. TSH is normal.  4. HIV. Continue Atripla.  5. Hypertension. Continue antihypertensives. VS well controlled  6. Dyslipidemia. Continue Zocor. Lipid panel is not elevated.  7. BPH. We will continue Flomax and Terazosin.  8. Social. The patient is not allowed to return to his group home.  Currently homeless. Unable to care for self without supervision.  9. Insomnia. Sleep continues to be poor spite of treatment with Restoril, Ambien and Trazodone. Will offer Ativan tonight.   10. Constipation. He is on bowel regimen.   11. Inguinal hernia. Medicine input is greatly appreciated. He complains of pain not eased with Tylenol. We started Motrin.   12. Vomiting. Resolved.   13. Urinary incontinence. We started DDAVP.   14. Disposition. To be established. He can not be placed as he was refused PASSR. He is on wait list for [facility].   No medication adjustments were offered today. Patient is worse today. 01/23/2015  Burnham Trost 01/23/2015, 2:22 PM

## 2015-01-23 NOTE — Plan of Care (Signed)
Problem: Alteration in mood Goal: STG-Patient is able to discuss feelings and issues (Patient is able to discuss feelings and issues leading to depression)  Outcome: Not Progressing Patient not able to discuss feeling without being disruptive .

## 2015-01-23 NOTE — Plan of Care (Signed)
Problem: Ineffective individual coping Goal: STG: Patient will remain free from self harm Outcome: Progressing Medications administered as ordered by the physician, medications Therapeutic Effects, SEs and Adverse effects discussed, questions encouraged; no PRN given, 15 minute checks maintained for safety, clinical and moral support provided, patient encouraged to continue to express feelings and demonstrate safe care. Patient remain free from harm, will continue to monitor.         

## 2015-01-23 NOTE — Progress Notes (Signed)
D:Incontinent  Of urine  Patient remains in street clothes  steady  Slow  gait  Non participatory with unit programing . Noted guarded . Continue to voice of wanting to be discharge and is aware of no placement at this time Delusional and paranoid at intervals during  Shift .  A: Encourage patient to come to staff for any concerns or issues needing to be addressed . Instructions given on medication , verbalize understanding. Writer addressing issues R: Voice no other concerns , staff continue tor monitor .

## 2015-01-23 NOTE — Progress Notes (Signed)
Recreation Therapy Notes  Date: 11.02.16 Time: 3:00 pm Location: Craft Room  Group Topic: Self-esteem  Goal Area(s) Addresses:  Patient will identify at least one positive attribute about self. Patient will identify at least one coping skill.  Behavioral Response: Did not attend  Intervention: All About Me  Activity: Patients were instructed to make an All About Me pamphlet listing their life motto, positive traits, healthy coping skills, and their healthy support system.  Education: LRT educated patients on ways to increase their self-esteem.  Education Outcome: Patient did not attend group.  Clinical Observations/Feedback: Patient did not attend group.  Jacquelynn CreeGreene,Galvin Aversa M, LRT/CTRS 01/23/2015 4:00 PM

## 2015-01-23 NOTE — [facility] Group Notes (Signed)
[facility] Group Notes:  (Nursing/MHT/Case Management/Adjunct)  Date:  01/23/2015  Time:  5:06 AM  Type of Therapy:  Group Therapy  Participation Level:  Did Not Attend    Summary of Progress/Problems:  Veva Holesshley Imani Ellissa Ayo 01/23/2015, 5:06 AM

## 2015-01-24 NOTE — Plan of Care (Signed)
Problem: Ineffective individual coping Goal: STG: Patient will remain free from self harm Outcome: Progressing Medications administered as ordered by the physician, medications Therapeutic Effects, SEs and Adverse effects discussed, questions encouraged; no PRN given, 15 minute checks maintained for safety, clinical and moral support provided, patient encouraged to continue to express feelings and demonstrate safe care. Patient remain free from harm, will continue to monitor.         

## 2015-01-24 NOTE — Progress Notes (Signed)
D: Patient remains to wear his clothing's,unsteady gait  Non participatory with unit programing . Noted guarded Affect angry and agitated on approach Continue to yell aloud . Continue to voice of wanting to kill  His doctor . Patient call  His doctor a bitch Patient became unruly in group refusing to leave  Sat in floor in hallway yelling aloud  A: Encourage patient to come to staff for any concerns or issues needing to be addressed . Instructions given on medication , verbalize understanding. Writer addressing issues R: Voice no other concerns , staff continue tor monitor .

## 2015-01-24 NOTE — Tx Team (Signed)
Interdisciplinary Treatment Plan Update (Adult)  Date:  01/24/2015 Time Reviewed:  3:56 PM  Progress in Treatment: Attending groups: No. Participating in groups:  No. Taking medication as prescribed:  Yes. Tolerating medication:  Yes. Family/Significant othe contact made:  Yes, individual(s) contacted:  ACT team  Patient understands diagnosis:  Yes. Discussing patient identified problems/goals with staff:  Yes. Medical problems stabilized or resolved:  Yes. Denies suicidal/homicidal ideation: No. and As evidenced by:  Threats made against staff and self Issues/concerns per patient self-inventory:  Yes. Other:  New problem(s) identified: No, Describe:  NA  Discharge Plan or Barriers: Pt wants to be discharged so he can commit a crime to go to jail.   Reason for Continuation of Hospitalization: Aggression Homicidal ideation Mania Medication stabilization Suicidal ideation  Comments:[name] still wants to kill me but today he also wants to kill himself by sticking his head in the toilet. He demands to be discharged. He does not understand that this is impossible since he was not given a [name]. He provided me with 4 different telephone numbers to different group homes and a boarding house that he found in the [place]. He refused medications yesterday took them this morning. He still believes that lithium goes to his head, some abdominal pain. He does not appear to be in pain. He is visible in the community and is active in groups.   Estimated length of stay: 7 days   New goal(s):  Review of initial/current patient goals per problem list:   1.  Goal(s): Patient will participate in aftercare plan * Met:  * Target date: at discharge * As evidenced by: Patient will participate within aftercare plan AEB aftercare provider and housing plan at discharge being identified.   2.  Goal (s): Patient will exhibit decreased depressive symptoms and suicidal ideations. * Met:  *   Target date: at discharge * As evidenced by: Patient will utilize self rating of depression at 3 or below and demonstrate decreased signs of depression or be deemed stable for discharge by MD.  3.  Goal (s): Patient will demonstrate decreased symptoms of mania. * Met: No  *  Target date: at discharge * As evidenced by: Patient will not endorse signs of mania or be deemed stable for discharge by MD.   Attendees: Patient:  [name] 11/3/20163:56 PM  Family:   11/3/20163:56 PM  Physician:   Dr. Bary Leriche  11/3/20163:56 PM  Nursing:   Meredith Mody, RN  11/3/20163:56 PM  Case Manager:   11/3/20163:56 PM  Counselor:   11/3/20163:56 PM  Other:  Wray Kearns, [place] 11/3/20163:56 PM  Other:  Everitt Amber, [place]  11/3/20163:56 PM  Other:   11/3/20163:56 PM  Other:  11/3/20163:56 PM  Other:  11/3/20163:56 PM  Other:  11/3/20163:56 PM  Other:  11/3/20163:56 PM  Other:  11/3/20163:56 PM  Other:  11/3/20163:56 PM  Other:   11/3/20163:56 PM   Scribe for Treatment Team:   Lourena Simmonds, LCSWA  01/24/2015, 3:56 PM

## 2015-01-24 NOTE — Plan of Care (Signed)
Problem: Alteration in mood Goal: LTG-Patient reports reduction in suicidal thoughts (Patient reports reduction in suicidal thoughts and is able to verbalize a safety plan for whenever patient is feeling suicidal)  Outcome: Not Progressing Ambivalent, unable to say

## 2015-01-24 NOTE — [facility] Group Notes (Signed)
[facility] Group Notes:  (Nursing/MHT/Case Management/Adjunct)  Date:  01/24/2015  Time:  11:31 PM  Type of Therapy:  Psychoeducational Skills  Participation Level:  Did Not Attend  Summary of Progress/Problems:  Foy GuadalajaraJasmine R Chariti Havel 01/24/2015, 11:31 PM

## 2015-01-24 NOTE — Progress Notes (Signed)
[facility] MD Progress Note  01/24/2015 2:59 PM [name]  MRN:  [id number]  Subjective:  [name] still wants to kill me but today he also wants to kill himself by sticking his head in the toilet. He demands to be discharged. He does not understand that this is impossible since he was not given a [name]. He provided me with 4 different telephone numbers to different group homes and a boarding house that he found in the phonebook. He refused medications yesterday took them this morning. He still believes that lithium goes to his head, some abdominal pain. He does not appear to be in pain. He is visible in the community and is active in groups.  Principal Problem: Schizoaffective disorder, bipolar type (HCC) Diagnosis:   Patient Active Problem List   Diagnosis Date Noted  . Scrotal pain [N50.82] 01/11/2015  . Tobacco use disorder [F17.200] 12/27/2014  . HIV positive (HCC) [Z21] 12/26/2014  . Hypertension [I10] 12/26/2014  . Hypothyroidism [E03.9] 12/26/2014  . Dyslipidemia [E78.5] 12/26/2014  . Schizoaffective disorder, bipolar type (HCC) [F25.0] 10/25/2014  . Alcohol abuse [F10.10] 10/25/2014   Total Time spent with patient: 15 minutes  Past Psychiatric History: Schizoaffective disorder bipolar type.  Past Medical History:  Past Medical History  Diagnosis Date  . HIV (human immunodeficiency virus infection) (HCC)   . BPH (benign prostatic hypertrophy)   . Dyslipidemia   . Hypertension   . Hypothyroidism   . Schizoaffective disorder (HCC)    History reviewed. No pertinent past surgical history. Family History:  Family History  Problem Relation Age of Onset  . Hypertension Other    Family Psychiatric  History: Aunt with schizophrenia. Social History:  History  Alcohol Use  . 1.2 - 1.8 oz/week  . 2-3 Cans of beer per week     History  Drug Use No    Social History   Social History  . Marital Status: Single    Spouse Name: N/A  . Number of Children: N/A  . Years  of Education: N/A   Social History Main Topics  . Smoking status: Current Every Day Smoker -- 2.00 packs/day    Types: Cigarettes  . Smokeless tobacco: None  . Alcohol Use: 1.2 - 1.8 oz/week    2-3 Cans of beer per week  . Drug Use: No  . Sexual Activity: Not Asked   Other Topics Concern  . None   Social History Narrative   Additional Social History:                         Sleep: Fair  Appetite:  Good  Current Medications: Current Facility-Administered Medications  Medication Dose Route Frequency Provider Last Rate Last Dose  . acetaminophen (TYLENOL) tablet 650 mg  650 mg Oral Q6H PRN Audery Amel, MD   650 mg at 01/16/15 0548  . alum & mag hydroxide-simeth (MAALOX/MYLANTA) 200-200-20 MG/5ML suspension 30 mL  30 mL Oral Q4H PRN Audery Amel, MD   30 mL at 01/15/15 1837  . aspirin EC tablet 81 mg  81 mg Oral QAC breakfast Shari Prows, MD   81 mg at 01/24/15 0818  . benztropine (COGENTIN) tablet 2 mg  2 mg Oral QAC breakfast Shari Prows, MD   2 mg at 01/24/15 0818  . carvedilol (COREG) tablet 3.125 mg  3.125 mg Oral BID WC Audery Amel, MD   3.125 mg at 01/24/15 0817  . cholecalciferol (VITAMIN D) tablet  2,000 Units  2,000 Units Oral QAC breakfast Shari ProwsJolanta B Kia Stavros, MD   2,000 Units at 01/24/15 0817  . desmopressin (DDAVP) tablet 0.2 mg  0.2 mg Oral QHS Jenya Putz B Miller Edgington, MD   0.2 mg at 01/23/15 2129  . divalproex (DEPAKOTE ER) 24 hr tablet 500 mg  500 mg Oral QHS Shari ProwsJolanta B Victorine Mcnee, MD   500 mg at 01/23/15 2129  . efavirenz-emtricitabine-tenofovir (ATRIPLA) 600-200-300 MG per tablet 1 tablet  1 tablet Oral QHS Audery AmelJohn T Clapacs, MD   1 tablet at 01/23/15 2129  . fluPHENAZine decanoate (PROLIXIN) injection 50 mg  50 mg Intramuscular Q14 Days Mikaia Janvier B Nala Kachel, MD   50 mg at 01/15/15 0907  . hydrOXYzine (ATARAX/VISTARIL) tablet 50 mg  50 mg Oral QHS Shari ProwsJolanta B Magda Muise, MD   50 mg at 01/23/15 2135  . ibuprofen (ADVIL,MOTRIN) tablet 600 mg   600 mg Oral Q6H PRN Zell Doucette B Damara Klunder, MD   600 mg at 01/16/15 1356  . iohexol (OMNIPAQUE) 240 MG/ML injection 50 mL  50 mL Oral Once PRN Medication Radiologist, MD      . levothyroxine (SYNTHROID, LEVOTHROID) tablet 25 mcg  25 mcg Oral QAC breakfast Audery AmelJohn T Clapacs, MD   25 mcg at 01/24/15 [id number]  . lithium carbonate capsule 300 mg  300 mg Oral TID WC Wilena Tyndall B Riyah Bardon, MD   300 mg at 01/24/15 1234  . LORazepam (ATIVAN) tablet 2 mg  2 mg Oral QHS Shari ProwsJolanta B Teneil Shiller, MD   2 mg at 01/23/15 2129  . magnesium citrate solution 1 Bottle  1 Bottle Oral Once Shari ProwsJolanta B Calla Wedekind, MD   1 Bottle at 01/10/15 1432  . magnesium hydroxide (MILK OF MAGNESIA) suspension 30 mL  30 mL Oral Daily PRN Audery AmelJohn T Clapacs, MD   30 mL at 01/21/15 1743  . paliperidone (INVEGA SUSTENNA) injection 234 mg  234 mg Intramuscular Q28 days Shari ProwsJolanta B Ame Heagle, MD   234 mg at 01/01/15 0943  . promethazine (PHENERGAN) tablet 25 mg  25 mg Oral Q6H PRN Shari ProwsJolanta B Newman Waren, MD   25 mg at 01/15/15 1837  . simvastatin (ZOCOR) tablet 20 mg  20 mg Oral q1800 Audery AmelJohn T Clapacs, MD   20 mg at 01/22/15 1723  . tamsulosin (FLOMAX) capsule 0.4 mg  0.4 mg Oral QPC supper Audery AmelJohn T Clapacs, MD   0.4 mg at 01/22/15 1723  . terazosin (HYTRIN) capsule 1 mg  1 mg Oral QHS Audery AmelJohn T Clapacs, MD   1 mg at 01/23/15 2128  . traZODone (DESYREL) tablet 300 mg  300 mg Oral QHS Shari ProwsJolanta B Ketzia Guzek, MD   300 mg at 01/23/15 2128    Lab Results: No results found for this or any previous visit (from the past 48 hour(s)).  Physical Findings: AIMS:  , ,  ,  ,    CIWA:    COWS:     Musculoskeletal: Strength & Muscle Tone: within normal limits Gait & Station: normal Patient leans: N/A  Psychiatric Specialty Exam: Review of Systems  Gastrointestinal: Positive for abdominal pain.  All other systems reviewed and are negative.   Blood pressure 109/72, pulse 70, temperature 98.8 F (37.1 C), temperature source Oral, resp. rate 20, height 6' (1.829 m),  weight 79.379 kg (175 lb), SpO2 99 %.Body mass index is 23.73 kg/(m^2).  General Appearance: Casual  Eye Contact::  Absent  Speech:  Pressured  Volume:  Increased  Mood:  Angry, Dysphoric and Irritable  Affect:  Inappropriate  Thought Process:  Disorganized  Orientation:  Full (Time, Place, and Person)  Thought Content:  Delusions and Paranoid Ideation  Suicidal Thoughts:  No  Homicidal Thoughts:  No  Memory:  Immediate;   Fair Recent;   Fair Remote;   Fair  Judgement:  Poor  Insight:  Lacking  Psychomotor Activity:  Increased  Concentration:  Fair  Recall:  [employer] of Knowledge:Fair  Language: Fair  Akathisia:  No  Handed:  Right  AIMS (if indicated):     Assets:  Communication Skills Desire for Improvement Financial Resources/Insurance Resilience  ADL's:  Intact  Cognition: WNL  Sleep:  Number of Hours: 7   Treatment Plan Summary: Daily contact with patient to assess and evaluate symptoms and progress in treatment and Medication management   [name] is a 54 year old male with long history of schizoaffective disorder admitted for agitated and threatening behavior at the group home in the context of partial medication compliance.  1. Agitation. [name] is agitated and threatening his physician.    2. Mood and psychosis. He is maintained on injections of Prolixin and Invega sustenna for psychosis. We tapered off oral antipsychotics. We continue Depakote at 1000 mg at night and lithium 300 mg tid for mood stabilization. He has a history of high ammonia on Depakote. He is still delusional, paranoid and disorganized in addition to agitation.   3. Hypothyroidism. Continue Synthroid. TSH is normal.  4. HIV. Continue Atripla.  5. Hypertension. Continue antihypertensives. VS well controlled  6. Dyslipidemia. Continue Zocor. Lipid panel is not elevated.  7. BPH. We will continue Flomax and Terazosin.  8. Social. The patient is not allowed to return to his group  home. Currently homeless. Unable to care for self without supervision.  9. Insomnia. Sleep continues to be poor spite of treatment with Restoril, Ambien and Trazodone. Will offer Ativan tonight.   10. Constipation. He is on bowel regimen.   11. Inguinal hernia. Medicine input is greatly appreciated. He complains of pain not eased with Tylenol. We started Motrin.   12. Vomiting. Resolved.   13. Urinary incontinence. We started DDAVP.   14. Disposition. To be established. He can not be placed as he was refused PASSR. He is on wait list for [facility].   No medication adjustments were offered today. Patient is worse today. 01/24/2015  [name] 01/24/2015, 2:59 PM

## 2015-01-24 NOTE — [facility] Group Notes (Signed)
[facility] Group Notes:  (Nursing/MHT/Case Management/Adjunct)  Date:  01/24/2015  Time:  2:24 PM  Type of Therapy:  Group Therapy  Participation Level:  Minimal  Participation Quality:  Intrusive and Sharing  Affect:  Angry  Cognitive:  Alert, Appropriate and Oriented  Insight:  Limited  Engagement in Group:  Limited  Modes of Intervention:  Discussion  Summary of Progress/Problems:  [name] [name] 01/24/2015, 2:24 PM

## 2015-01-24 NOTE — Progress Notes (Signed)
Recreation Therapy Notes  Date: 11.03.16 Time: 3:00 pm Location: Craft Room  Group Topic: Leisure Education  Goal Area(s) Addresses:  Patient will identify things they are grateful for. Patient will identify how being grateful can influence decision making.  Behavioral Response: Attentive, Disruptive  Intervention: Grateful Wheel  Activity: Patients were given an I am Grateful For worksheet and instructed to think of 2-3 things they were grateful for under each category.  Education: LRT educated patients on why it is important to be grateful.  Education Outcome: In group clarification offered   Clinical Observations/Feedback: Patient colored worksheet during group. Patient did not contribute to group discussion. When group was over, patient had not finished coloring his worksheet. LRT asked patient for the marker. Patient refused. LRT continued to ask patient for the marker stating she had work to finish. Patient asked if he could take the marker and return it. LRT explained it was a rule that the markers had to be left in the craft room. Patient continued to refuse to leave stating LRT would have to get security. LRT got security. Patient went out in the hall and sat down. Security tried to get the marker. Patient broke the marker. Security picked it up and handed it to LRT. Patient started yelling and eventually calmed down and went back to his room.  Jacquelynn CreeGreene,Tashima Scarpulla M, LRT/CTRS 01/24/2015 4:19 PM

## 2015-01-24 NOTE — [facility] Group Notes (Signed)
[facility] Group Notes:  (Nursing/MHT/Case Management/Adjunct)  Date:  01/24/2015  Time:  6:29 AM  Type of Therapy:  Group Therapy  Participation Level:  Minimal  Participation Quality:  Intrusive  Affect:  Defensive and Irritable  Cognitive:  Alert  Insight:  Limited  Engagement in Group:  Distracting, Monopolizing and Poor  Modes of Intervention:  Limit-setting  Summary of Progress/Problems: Pt came into group late. He said the only reason he came to group was to complain about another pt's. Pt begin using terms like "nigga" and "cracker" staff asked Pt to leave. He left stating he didn't want to be here anyway.   Fanny Skatesshley Imani Jujuan Dugo 01/24/2015, 6:29 AM

## 2015-01-25 MED ORDER — DIVALPROEX SODIUM ER 500 MG PO TB24
1000.0000 mg | ORAL_TABLET | Freq: Every day | ORAL | Status: DC
  Administered 2015-01-25 – 2015-01-26 (×2): 1000 mg via ORAL
  Administered 2015-01-27: 500 mg via ORAL
  Administered 2015-01-28 – 2015-01-30 (×3): 1000 mg via ORAL
  Filled 2015-01-25 (×6): qty 2

## 2015-01-25 NOTE — [facility] Group Notes (Signed)
[facility] Group Notes:  (Nursing/MHT/Case Management/Adjunct)  Date:  01/25/2015  Time:  10:17 PM  Type of Therapy:  Group Therapy  Participation Level:  Active  Participation Quality:  Intrusive and Monopolizing  Affect:  Angry  Cognitive:  Appropriate  Insight:  Appropriate  Engagement in Group:  Off Topic  Modes of Intervention:  Support  Summary of Progress/Problems:  Daniel ReddenOlivia Briana Benitez 01/25/2015, 10:17 PM

## 2015-01-25 NOTE — Progress Notes (Signed)
Recreation Therapy Notes  Date: 11.04.16 Time: 3:00 pm Location: Craft Room  Group Topic: Coping Skills  Goal Area(s) Addresses:  Patient will participate in coping skill. Patient will verbalize benefit of using art as a coping skill.  Behavioral Response: Did not attend  Intervention: Coloring  Activity: Patients were given coloring sheets and instructed to color and focus on what they were feeling while they were coloring.  Education: LRT educated patients on healthy coping skills.  Education Outcome: Patient did not attend group.   Clinical Observations/Feedback: Patient did not attend group.   Jacquelynn CreeGreene,Nichole Neyer M, LRT/CTRS 01/25/2015 3:52 PM

## 2015-01-25 NOTE — [facility] Group Notes (Signed)
[facility] Group Notes:  (Nursing/MHT/Case Management/Adjunct)  Date:  01/25/2015  Time:  11:50 AM  Type of Therapy:  Group Therapy  Participation Level:  Did Not Attend  Tuleen Mandelbaum De'Chelle Penney Domanski 01/25/2015, 11:50 AM

## 2015-01-25 NOTE — [facility] Group Notes (Signed)
[facility] LCSW Group Therapy  01/25/2015 2:09 PM  Type of Therapy:  Group Therapy  Participation Level:  Did Not Attend  Modes of Intervention:  Discussion, Education, Socialization and Support  Summary of Progress/Problems: Feelings around Relapse. Group members discussed the meaning of relapse and shared personal stories of relapse, how it affected them and others, and how they perceived themselves during this time. Group members were encouraged to identify triggers, warning signs and coping skills used when facing the possibility of relapse. Social supports were discussed and explored in detail.   Thornton Dohrmann L Abrahan Fulmore MSW, LCSWA  01/25/2015, 2:09 PM 

## 2015-01-25 NOTE — Plan of Care (Signed)
Problem: Ineffective individual coping Goal: LTG: Patient will report a decrease in negative feelings Outcome: Not Progressing Patient continues to be angry and resentful, and notes he will only gets worse the longer he has to stay hospitalized.  Goal: STG: Pt will be able to identify effective and ineffective STG: Pt will be able to identify effective and ineffective coping patterns  Outcome: Not Progressing Patient notes he would like to sue the hospital for being kept against his will, noting all he has done is yell and he should not be able to be contained. He is med compliant this evening, but was not earlier in the day. He notes he will continue to be angry until he leaves. He states no motivation for learning healthy coping skills as he does not believe [facility] needs [facility].  Goal: STG: Patient will remain free from self harm Outcome: Progressing No self harm.

## 2015-01-25 NOTE — Progress Notes (Signed)
He is still yelling & loud on approach.Cursing his doctor for giving him medications.Took all meds except lithium this morning.Did not attend groups.Stayed in bed most of the time.Took lithium in the afternoon.

## 2015-01-25 NOTE — Progress Notes (Addendum)
[facility]  01/25/2015 11:57 AM Daniel MoundJaime Benitez  MRN:  [id number]  Subjective:  [name] is a 54 year old male with a history of severe schizoaffective disorder bipolar type admitting than manic, dysphoric episode. He has been in the hospital for 30 days but there is very little improvement. He is on a combination of injectable [place]Invega Sustenna and Prolixin for psychosis. He is on a combination of lithium and low dose Depakote for mood stabilization. He refuses lithium at times believing that it goes to his hernia and causes pain. [facility] as we were refused the [name] necessary for new placement as the patient is believed to be unstable for discharge. In the past few days he has been threatening to kill me and he is himself in order to be transferred to jail and prison. He writes copious notes every day.   Today [name] is very irritable, loud, disruptive, and difficult to redirect. He left me another letter asking to transfer him to jail. He refused his morning lithium. He is very disruptive in the milieu and in groups. There is a history of high ammonia on Depakote but I'm going to increase Depakote dose, which she accepts, to 1000 mg. We will monitor ammonia.    Principal Problem: Schizoaffective disorder, bipolar type (HCC) Diagnosis:   Patient Active Problem List   Diagnosis Date Noted  . Scrotal pain [N50.82] 01/11/2015  . Tobacco use disorder [F17.200] 12/27/2014  . HIV positive (HCC) [Z21] 12/26/2014  . Hypertension [I10] 12/26/2014  . Hypothyroidism [E03.9] 12/26/2014  . Dyslipidemia [E78.5] 12/26/2014  . Schizoaffective disorder, bipolar type (HCC) [F25.0] 10/25/2014  . Alcohol abuse [F10.10] 10/25/2014   Total Time spent with patient: 20 minutes  Past Psychiatric History: Schizoaffective disorder bipolar type.  Past Medical History:  Past Medical History  Diagnosis Date  . HIV (human immunodeficiency virus infection)  (HCC)   . BPH (benign prostatic hypertrophy)   . Dyslipidemia   . Hypertension   . Hypothyroidism   . Schizoaffective disorder (HCC)    History reviewed. No pertinent past surgical history. Family History:  Family History  Problem Relation Age of Onset  . Hypertension Other    Family Psychiatric  History: Aunt with schizophrenia.  Social History:  History  Alcohol Use  . 1.2 - 1.8 oz/week  . 2-3 Cans of beer per week     History  Drug Use No    Social History   Social History  . Marital Status: Single    Spouse Name: N/A  . Number of Children: N/A  . Years of Education: N/A   Social History Main Topics  . Smoking status: Current Every Day Smoker -- 2.00 packs/day    Types: Cigarettes  . Smokeless tobacco: None  . Alcohol Use: 1.2 - 1.8 oz/week    2-3 Cans of beer per week  . Drug Use: No  . Sexual Activity: Not Asked   Other Topics Concern  . None   Social History Narrative   Additional Social History:                         Sleep: Fair  Appetite:  Good  Current Medications: Current Facility-Administered Medications  Medication Dose Route Frequency Provider Last Rate Last Dose  . acetaminophen (TYLENOL) tablet 650 mg  650 mg Oral Q6H PRN Audery AmelJohn T Clapacs, MD   650 mg at 01/16/15 0548  . alum & mag  hydroxide-simeth (MAALOX/MYLANTA) 200-200-20 MG/5ML suspension 30 mL  30 mL Oral Q4H PRN Audery Amel, MD   30 mL at 01/15/15 1837  . aspirin EC tablet 81 mg  81 mg Oral QAC breakfast Shari Prows, MD   81 mg at 01/25/15 0816  . benztropine (COGENTIN) tablet 2 mg  2 mg Oral QAC breakfast Shari Prows, MD   2 mg at 01/25/15 0816  . carvedilol (COREG) tablet 3.125 mg  3.125 mg Oral BID WC Audery Amel, MD   3.125 mg at 01/25/15 0816  . cholecalciferol (VITAMIN D) tablet 2,000 Units  2,000 Units Oral QAC breakfast Shari Prows, MD   2,000 Units at 01/25/15 0816  . desmopressin (DDAVP) tablet 0.2 mg  0.2 mg Oral QHS Shalla Bulluck B  Gresham Caetano, MD   0.2 mg at 01/24/15 2253  . divalproex (DEPAKOTE ER) 24 hr tablet 500 mg  500 mg Oral QHS Zyion Doxtater B Aniella Wandrey, MD   500 mg at 01/24/15 2252  . efavirenz-emtricitabine-tenofovir (ATRIPLA) 600-200-300 MG per tablet 1 tablet  1 tablet Oral QHS Audery Amel, MD   1 tablet at 01/24/15 2252  . fluPHENAZine decanoate (PROLIXIN) injection 50 mg  50 mg Intramuscular Q14 Days Dina Mobley B Aftyn Nott, MD   50 mg at 01/15/15 0907  . hydrOXYzine (ATARAX/VISTARIL) tablet 50 mg  50 mg Oral QHS Humna Moorehouse B Ader Fritze, MD   50 mg at 01/24/15 2252  . ibuprofen (ADVIL,MOTRIN) tablet 600 mg  600 mg Oral Q6H PRN Leeya Rusconi B Luisangel Wainright, MD   600 mg at 01/16/15 1356  . iohexol (OMNIPAQUE) 240 MG/ML injection 50 mL  50 mL Oral Once PRN Medication Radiologist, MD      . levothyroxine (SYNTHROID, LEVOTHROID) tablet 25 mcg  25 mcg Oral QAC breakfast Audery Amel, MD   25 mcg at 01/25/15 0816  . lithium carbonate capsule 300 mg  300 mg Oral TID WC Efstathios Sawin B Jalin Erpelding, MD   300 mg at 01/24/15 1234  . LORazepam (ATIVAN) tablet 2 mg  2 mg Oral QHS Shari Prows, MD   2 mg at 01/24/15 2253  . magnesium citrate solution 1 Bottle  1 Bottle Oral Once Shari Prows, MD   1 Bottle at 01/10/15 1432  . magnesium hydroxide (MILK OF MAGNESIA) suspension 30 mL  30 mL Oral Daily PRN Audery Amel, MD   30 mL at 01/21/15 1743  . paliperidone (INVEGA SUSTENNA) injection 234 mg  234 mg Intramuscular Q28 days Shari Prows, MD   234 mg at 01/01/15 0943  . promethazine (PHENERGAN) tablet 25 mg  25 mg Oral Q6H PRN Shari Prows, MD   25 mg at 01/15/15 1837  . simvastatin (ZOCOR) tablet 20 mg  20 mg Oral q1800 Audery Amel, MD   20 mg at 01/22/15 1723  . tamsulosin (FLOMAX) capsule 0.4 mg  0.4 mg Oral QPC supper Audery Amel, MD   0.4 mg at 01/22/15 1723  . terazosin (HYTRIN) capsule 1 mg  1 mg Oral QHS Audery Amel, MD   1 mg at 01/24/15 2253  . traZODone (DESYREL) tablet 300 mg  300 mg Oral QHS  Aseel Uhde B Michaline Kindig, MD   300 mg at 01/24/15 2252    Lab Results: No results found for this or any previous visit (from the past 48 hour(s)).  Physical Findings: AIMS:  , ,  ,  ,    CIWA:    COWS:     Musculoskeletal: Strength &  Muscle Tone: within normal limits Gait & Station: normal Patient leans: N/A  Psychiatric Specialty Exam: Review of Systems  Gastrointestinal: Positive for abdominal pain.  All other systems reviewed and are negative.   Blood pressure 95/65, pulse 87, temperature 97.7 F (36.5 C), temperature source Oral, resp. rate 20, height 6' (1.829 m), weight 79.379 kg (175 lb), SpO2 99 %.Body mass index is 23.73 kg/(m^2).  General Appearance: Disheveled  Eye Contact::  Poor  Speech:  Pressured  Volume:  Increased  Mood:  Angry, Dysphoric and Irritable  Affect:  Inappropriate and Labile  Thought Process:  Disorganized  Orientation:  Full (Time, Place, and Person)  Thought Content:  Delusions and Paranoid Ideation  Suicidal Thoughts:  Yes.  with intent/plan  Homicidal Thoughts:  Yes.  with intent/plan  Memory:  Immediate;   Fair Recent;   Fair Remote;   Fair  Judgement:  Poor  Insight:  Lacking  Psychomotor Activity:  EPS and Increased  Concentration:  Fair  Recall:  [employer] of Knowledge:Fair  Language: Fair  Akathisia:  No  Handed:  Right  AIMS (if indicated):     Assets:  Communication Skills Desire for Improvement Financial Resources/Insurance Physical Health Resilience  ADL's:  Intact  Cognition: WNL  Sleep:  Number of Hours: 8.5   Treatment Plan Summary: Daily contact with patient to assess and evaluate symptoms and progress in treatment and Medication management   [name] is a 54 year old male with long history of schizoaffective disorder admitted for agitated and threatening behavior at the group home in the context of partial medication compliance.  1. Agitation. [name] is agitated and threatening his physician.    2. Mood  and psychosis. He is maintained on injections of Prolixin and Invega sustenna for psychosis. We tapered off oral antipsychotics. He has been taking Depakote 500 mg at bedtime. I will increase it to thousand milligrams at bedtime. Will continue lithium 300 mg tid for mood stabilization. He has a history of high ammonia on Depakote. He is still delusional, paranoid and disorganized in addition to agitation.   3. Hypothyroidism. Continue Synthroid. TSH is normal.  4. HIV. Continue Atripla.  5. Hypertension. Continue antihypertensives. VS well controlled  6. Dyslipidemia. Continue Zocor. Lipid panel is not elevated.  7. BPH. We will continue Flomax and Terazosin.  8. Social. The patient is not allowed to return to his group home. Currently homeless. Unable to care for self without supervision.  9. Insomnia. Sleep continues to be poor spite of treatment with Restoril, Ambien and Trazodone. Will offer Ativan tonight.   10. Constipation. He is on bowel regimen.   11. Inguinal hernia. Medicine input is greatly appreciated. He complains of pain not eased with Tylenol. We started Motrin.   12. Vomiting. Resolved.   13. Urinary incontinence. We started DDAVP.   14. Disposition. To be established. He can not be placed as he was refused PASSR. He is on wait list for [facility].   I certify that the services received since the previous certification/recertification were and continue to be medically necessary as the treatment provided can be reasonably expected to improve the patient's condition; the medical record documents that the services furnished were intensive treatment services or their equivalent services, and this patient continues to need, on a daily basis, active treatment furnished directly by or requiring the supervision of inpatient psychiatric personnel.    Daric Koren 01/25/2015, 11:57 AM

## 2015-01-25 NOTE — Plan of Care (Signed)
Problem: Alteration in mood Goal: STG-Patient reports thoughts of self-harm to staff Outcome: Progressing Denies suicidal & homicidal ideation.

## 2015-01-26 NOTE — Plan of Care (Signed)
Problem: Ineffective individual coping Goal: LTG: Patient will report a decrease in negative feelings Outcome: Not Progressing Pt continues to be angry and today was threatening to kill someone before he leaves.  Goal: STG: Pt will be able to identify effective and ineffective STG: Pt will be able to identify effective and ineffective coping patterns  Outcome: Not Progressing Patient noted he wanted to attend group, but when he arrived he began to yell and talk about how he was going to kill someone on the unit. He was redirected and left the group.  Goal: STG: Patient will remain free from self harm Outcome: Progressing No self harm.  Goal: STG: Patient will participate in after care plan Outcome: Progressing Patient continues to state he needs to be released to the street.  Goal: STG-Increase in ability to manage activities of daily living Outcome: Progressing Patient is showering and maintaining his room independently.

## 2015-01-26 NOTE — Progress Notes (Signed)
[facility] MD Progress Note  01/26/2015 5:01 PM Dee Maday  MRN:  [id number]  Subjective: Patient seen for follow-up. He has been in the hospital for almost 30 days and has been taking his medication as prescribed.  He refuses lithium at times believing that it goes to his hernia and causes pain. He is awaiting transfer to [facility] as we were refused the [name] necessary for new placement as the patient is believed to be unstable for discharge.     Principal Problem: Schizoaffective disorder, bipolar type (HCC) Diagnosis:   Patient Active Problem List   Diagnosis Date Noted  . Scrotal pain [N50.82] 01/11/2015  . Tobacco use disorder [F17.200] 12/27/2014  . HIV positive (HCC) [Z21] 12/26/2014  . Hypertension [I10] 12/26/2014  . Hypothyroidism [E03.9] 12/26/2014  . Dyslipidemia [E78.5] 12/26/2014  . Schizoaffective disorder, bipolar type (HCC) [F25.0] 10/25/2014  . Alcohol abuse [F10.10] 10/25/2014   Total Time spent with patient: 20 minutes  Past Psychiatric History: Schizoaffective disorder bipolar type.  Past Medical History:  Past Medical History  Diagnosis Date  . HIV (human immunodeficiency virus infection) (HCC)   . BPH (benign prostatic hypertrophy)   . Dyslipidemia   . Hypertension   . Hypothyroidism   . Schizoaffective disorder (HCC)    History reviewed. No pertinent past surgical history. Family History:  Family History  Problem Relation Age of Onset  . Hypertension Other    Family Psychiatric  History: Aunt with schizophrenia.  Social History:  History  Alcohol Use  . 1.2 - 1.8 oz/week  . 2-3 Cans of beer per week     History  Drug Use No    Social History   Social History  . Marital Status: Single    Spouse Name: N/A  . Number of Children: N/A  . Years of Education: N/A   Social History Main Topics  . Smoking status: Current Every Day Smoker -- 2.00 packs/day    Types: Cigarettes  . Smokeless tobacco: None  . Alcohol Use:  1.2 - 1.8 oz/week    2-3 Cans of beer per week  . Drug Use: No  . Sexual Activity: Not Asked   Other Topics Concern  . None   Social History Narrative   Additional Social History:                         Sleep: Fair  Appetite:  Good  Current Medications: Current Facility-Administered Medications  Medication Dose Route Frequency Provider Last Rate Last Dose  . acetaminophen (TYLENOL) tablet 650 mg  650 mg Oral Q6H PRN Audery Amel, MD   650 mg at 01/16/15 0548  . alum & mag hydroxide-simeth (MAALOX/MYLANTA) 200-200-20 MG/5ML suspension 30 mL  30 mL Oral Q4H PRN Audery Amel, MD   30 mL at 01/15/15 1837  . aspirin EC tablet 81 mg  81 mg Oral QAC breakfast Jolanta B Pucilowska, MD   81 mg at 01/26/15 0831  . benztropine (COGENTIN) tablet 2 mg  2 mg Oral QAC breakfast Shari Prows, MD   2 mg at 01/26/15 0833  . carvedilol (COREG) tablet 3.125 mg  3.125 mg Oral BID WC Audery Amel, MD   3.125 mg at 01/26/15 4098  . cholecalciferol (VITAMIN D) tablet 2,000 Units  2,000 Units Oral QAC breakfast Shari Prows, MD   2,000 Units at 01/26/15 [phone]  . desmopressin (DDAVP) tablet 0.2 mg  0.2 mg Oral QHS Jolanta B Pucilowska,  MD   0.2 mg at 01/25/15 2118  . divalproex (DEPAKOTE ER) 24 hr tablet 1,000 mg  1,000 mg Oral QHS Jolanta B Pucilowska, MD   1,000 mg at 01/25/15 2118  . efavirenz-emtricitabine-tenofovir (ATRIPLA) 600-200-300 MG per tablet 1 tablet  1 tablet Oral QHS Audery Amel, MD   1 tablet at 01/25/15 2118  . fluPHENAZine decanoate (PROLIXIN) injection 50 mg  50 mg Intramuscular Q14 Days Jolanta B Pucilowska, MD   50 mg at 01/15/15 0907  . hydrOXYzine (ATARAX/VISTARIL) tablet 50 mg  50 mg Oral QHS Shari Prows, MD   50 mg at 01/25/15 2118  . ibuprofen (ADVIL,MOTRIN) tablet 600 mg  600 mg Oral Q6H PRN Jolanta B Pucilowska, MD   600 mg at 01/16/15 1356  . iohexol (OMNIPAQUE) 240 MG/ML injection 50 mL  50 mL Oral Once PRN Medication Radiologist, MD       . levothyroxine (SYNTHROID, LEVOTHROID) tablet 25 mcg  25 mcg Oral QAC breakfast Audery Amel, MD   25 mcg at 01/26/15 0831  . lithium carbonate capsule 300 mg  300 mg Oral TID WC Jolanta B Pucilowska, MD   300 mg at 01/26/15 1244  . LORazepam (ATIVAN) tablet 2 mg  2 mg Oral QHS Shari Prows, MD   2 mg at 01/25/15 2118  . magnesium citrate solution 1 Bottle  1 Bottle Oral Once Shari Prows, MD   1 Bottle at 01/10/15 1432  . magnesium hydroxide (MILK OF MAGNESIA) suspension 30 mL  30 mL Oral Daily PRN Audery Amel, MD   30 mL at 01/21/15 1743  . paliperidone (INVEGA SUSTENNA) injection 234 mg  234 mg Intramuscular Q28 days Shari Prows, MD   234 mg at 01/01/15 0943  . promethazine (PHENERGAN) tablet 25 mg  25 mg Oral Q6H PRN Shari Prows, MD   25 mg at 01/15/15 1837  . simvastatin (ZOCOR) tablet 20 mg  20 mg Oral q1800 Audery Amel, MD   20 mg at 01/25/15 1740  . tamsulosin (FLOMAX) capsule 0.4 mg  0.4 mg Oral QPC supper Audery Amel, MD   0.4 mg at 01/25/15 1740  . terazosin (HYTRIN) capsule 1 mg  1 mg Oral QHS Audery Amel, MD   1 mg at 01/25/15 2118  . traZODone (DESYREL) tablet 300 mg  300 mg Oral QHS Shari Prows, MD   300 mg at 01/25/15 2118    Lab Results: No results found for this or any previous visit (from the past 48 hour(s)).  Physical Findings: AIMS:  , ,  ,  ,    CIWA:    COWS:     Musculoskeletal: Strength & Muscle Tone: within normal limits Gait & Station: normal Patient leans: N/A  Psychiatric Specialty Exam: Review of Systems  Gastrointestinal: Positive for abdominal pain.  All other systems reviewed and are negative.   Blood pressure 95/65, pulse 87, temperature 97.7 F (36.5 C), temperature source Oral, resp. rate 20, height 6' (1.829 m), weight 175 lb (79.379 kg), SpO2 99 %.Body mass index is 23.73 kg/(m^2).  General Appearance: Disheveled  Eye Contact::  Poor  Speech:  Pressured  Volume:  Increased  Mood:   Angry, Dysphoric and Irritable  Affect:  Inappropriate and Labile  Thought Process:  Disorganized  Orientation:  Full (Time, Place, and Person)  Thought Content:  Delusions and Paranoid Ideation  Suicidal Thoughts:  Yes.  with intent/plan  Homicidal Thoughts:  Yes.  with intent/plan  Memory:  Immediate;   Fair Recent;   Fair Remote;   Fair  Judgement:  Poor  Insight:  Lacking  Psychomotor Activity:  EPS and Increased  Concentration:  Fair  Recall:  [employer]  Fund of Knowledge:Fair  Language: Fair  Akathisia:  No  Handed:  Right  AIMS (if indicated):     Assets:  Communication Skills Desire for Improvement Financial Resources/Insurance Physical Health Resilience  ADL's:  Intact  Cognition: WNL  Sleep:  Number of Hours: 5.75   Treatment Plan Summary: Daily contact with patient to assess and evaluate symptoms and progress in treatment and Medication management   [name] is a 54 year old male with long history of schizoaffective disorder admitted for agitated and threatening behavior at the group home in the context of partial medication compliance.  1. Agitation. [name] is agitated and threatening his physician.    2. Mood and psychosis. He is maintained on injections of Prolixin and Invega sustenna for psychosis. We tapered off oral antipsychotics. He has been taking Depakote 500 mg at bedtime. I will increase it to thousand milligrams at bedtime. Will continue lithium 300 mg tid for mood stabilization. He has a history of high ammonia on Depakote. He is still delusional, paranoid and disorganized in addition to agitation.   3. Hypothyroidism. Continue Synthroid. TSH is normal.  4. HIV. Continue Atripla.  5. Hypertension. Continue antihypertensives. VS well controlled  6. Dyslipidemia. Continue Zocor. Lipid panel is not elevated.  7. BPH. We will continue Flomax and Terazosin.  8. Social. The patient is not allowed to return to his group home. Currently homeless.  Unable to care for self without supervision.  9. Insomnia. Sleep continues to be poor spite of treatment with Restoril, Ambien and Trazodone. Will offer Ativan tonight.   10. Constipation. He is on bowel regimen.   11. Inguinal hernia. Medicine input is greatly appreciated. He complains of pain not eased with Tylenol. We started Motrin.   12. Vomiting. Resolved.   13. Urinary incontinence. We started DDAVP.   14. Disposition. To be established. He can not be placed as he was refused PASSR. He is on wait list for [facility].   I certify that the services received since the previous certification/recertification were and continue to be medically necessary as the treatment provided can be reasonably expected to improve the patient's condition; the medical record documents that the services furnished were intensive treatment services or their equivalent services, and this patient continues to need, on a daily basis, active treatment furnished directly by or requiring the supervision of inpatient psychiatric personnel.    Brandy HaleUzma Tavi Hoogendoorn 01/26/2015, 5:01 PM

## 2015-01-26 NOTE — Progress Notes (Signed)
Pt has been seclusive to his room most of the day. Pt has been compliant with taking po meds with some encouragement. Pt denies SI and A/V hallucinations.

## 2015-01-26 NOTE — [facility] Group Notes (Signed)
[facility] LCSW Group Therapy  01/26/2015 2:18 PM  Type of Therapy: Group Therapy  Participation Level: Did Not Attend  Modes of Intervention: Discussion, Education, Socialization and Support  Summary of Progress/Problems: Todays topic: Grudges Patients will be encouraged to discuss their thoughts, feelings, and behaviors as to why one holds on to grudges and reasons why people have grudges. Patients will process the impact of grudges on their daily lives and identify thoughts and feelings related to holding grudges. Patients will identify feelings and thoughts related to what life would look like without grudges.   Dolton Shaker L Jhase Creppel MSW, LCSWA  01/26/2015, 2:18 PM 

## 2015-01-26 NOTE — [facility] Group Notes (Signed)
[facility] Group Notes:  (Nursing/MHT/Case Management/Adjunct)  Date:  01/26/2015  Time:  9:47 PM  Type of Therapy:  Evening Wrap-up Group  Participation Level:  Did Not Attend  Participation Quality:  N/A  Affect:  N/A  Cognitive:  N/A  Insight:  None  Engagement in Group:  N/A  Modes of Intervention:  Discussion  Summary of Progress/Problems:  Tomasita MorrowChelsea Nanta Sherryann Frese 01/26/2015, 9:47 PM

## 2015-01-27 NOTE — Plan of Care (Signed)
Problem: Spiritual Needs Goal: Ability to function at adequate level Outcome: Progressing Patient is able to do any activity he chooses. Any self care not completed is noted to be in protest of "being held here in prison".   Problem: Ineffective individual coping Goal: LTG: Patient will report a decrease in negative feelings Outcome: Not Progressing Patient continues to state anger and frustration. Tonight he became tearful, which is not typical.  Goal: STG: Pt will be able to identify effective and ineffective STG: Pt will be able to identify effective and ineffective coping patterns  Outcome: Not Progressing Patient not able to effectively cope and is unable to identify any alternative behaviors, thought processes, or patterns.

## 2015-01-27 NOTE — [facility] Group Notes (Signed)
[facility] LCSW Group Therapy  01/27/2015 2:00 PM  Type of Therapy:  Group Therapy  Participation Level:  Did Not Attend  Modes of Intervention:  Discussion, Education, Socialization and Support  Summary of Progress/Problems: Balance in life: Patients will discuss the concept of balance and how it looks and feels to be unbalanced. Pt will identify areas in their life that is unbalanced and ways to become more balanced.    Karlye Ihrig L Mavis Fichera MSW, LCSWA  01/27/2015, 2:00 PM 

## 2015-01-27 NOTE — Plan of Care (Signed)
Problem: Spiritual Needs Goal: Ability to function at adequate level Outcome: Progressing Patient is completing self care independently. Tonight he was less willing to take all of his medications, but he was more willing to voice his thoughts and concerns in an appropriate way.   Problem: Ineffective individual coping Goal: LTG: Patient will report a decrease in negative feelings Outcome: Not Progressing Patient continues to remain angry and frustrated and is not pleased with his treatment plan.

## 2015-01-27 NOTE — Progress Notes (Signed)
[facility] MD Progress Note  01/27/2015 3:47 PM [name]  MRN:  [id number]  Subjective: Patient is a 54 year old male with history of schizoaffective disorder seen for follow-up. He has been in the hospital for almost 30 days and has been taking his medication as prescribed.  He was seen in his room and he was noted to be cutting NP stating pictures. He forgot that he has seen me yesterday and was asking my name over and over again. He was  asking if I would also help him with his discharge planning. He reported that he has been here for almost 30 days. Patient appears somewhat agitated during the interview but has been compliant with his medications. He refuses lithium at times believing that it goes to his hernia and causes pain. He is awaiting transfer to [facility]     Principal Problem: Schizoaffective disorder, bipolar type [facility]) Diagnosis:   Patient Active Problem List   Diagnosis Date Noted  . Scrotal pain [N50.82] 01/11/2015  . Tobacco use disorder [F17.200] 12/27/2014  . HIV positive (HCC) [Z21] 12/26/2014  . Hypertension [I10] 12/26/2014  . Hypothyroidism [E03.9] 12/26/2014  . Dyslipidemia [E78.5] 12/26/2014  . Schizoaffective disorder, bipolar type (HCC) [F25.0] 10/25/2014  . Alcohol abuse [F10.10] 10/25/2014   Total Time spent with patient: 20 minutes  Past Psychiatric History: Schizoaffective disorder bipolar type.  Past Medical History:  Past Medical History  Diagnosis Date  . HIV (human immunodeficiency virus infection) (HCC)   . BPH (benign prostatic hypertrophy)   . Dyslipidemia   . Hypertension   . Hypothyroidism   . Schizoaffective disorder (HCC)    History reviewed. No pertinent past surgical history. Family History:  Family History  Problem Relation Age of Onset  . Hypertension Other    Family Psychiatric  History: Aunt with schizophrenia.  Social History:  History  Alcohol Use  . 1.2 - 1.8 oz/week  . 2-3 Cans of beer per week      History  Drug Use No    Social History   Social History  . Marital Status: Single    Spouse Name: N/A  . Number of Children: N/A  . Years of Education: N/A   Social History Main Topics  . Smoking status: Current Every Day Smoker -- 2.00 packs/day    Types: Cigarettes  . Smokeless tobacco: None  . Alcohol Use: 1.2 - 1.8 oz/week    2-3 Cans of beer per week  . Drug Use: No  . Sexual Activity: Not Asked   Other Topics Concern  . None   Social History Narrative   Additional Social History:                         Sleep: Fair  Appetite:  Good  Current Medications: Current Facility-Administered Medications  Medication Dose Route Frequency Provider Last Rate Last Dose  . acetaminophen (TYLENOL) tablet 650 mg  650 mg Oral Q6H PRN Audery Amel, MD   650 mg at 01/16/15 0548  . alum & mag hydroxide-simeth (MAALOX/MYLANTA) 200-200-20 MG/5ML suspension 30 mL  30 mL Oral Q4H PRN Audery Amel, MD   30 mL at 01/15/15 1837  . aspirin EC tablet 81 mg  81 mg Oral QAC breakfast Jolanta B Pucilowska, MD   81 mg at 01/27/15 0930  . benztropine (COGENTIN) tablet 2 mg  2 mg Oral QAC breakfast Shari Prows, MD   2 mg at 01/27/15 0929  . carvedilol (COREG)  tablet 3.125 mg  3.125 mg Oral BID WC Audery Amel, MD   3.125 mg at 01/27/15 0932  . cholecalciferol (VITAMIN D) tablet 2,000 Units  2,000 Units Oral QAC breakfast Shari Prows, MD   2,000 Units at 01/27/15 0930  . desmopressin (DDAVP) tablet 0.2 mg  0.2 mg Oral QHS Jolanta B Pucilowska, MD   0.2 mg at 01/26/15 2135  . divalproex (DEPAKOTE ER) 24 hr tablet 1,000 mg  1,000 mg Oral QHS Shari Prows, MD   1,000 mg at 01/26/15 2136  . efavirenz-emtricitabine-tenofovir (ATRIPLA) 600-200-300 MG per tablet 1 tablet  1 tablet Oral QHS Audery Amel, MD   1 tablet at 01/26/15 2137  . fluPHENAZine decanoate (PROLIXIN) injection 50 mg  50 mg Intramuscular Q14 Days Jolanta B Pucilowska, MD   50 mg at 01/15/15 0907   . hydrOXYzine (ATARAX/VISTARIL) tablet 50 mg  50 mg Oral QHS Shari Prows, MD   50 mg at 01/26/15 2135  . ibuprofen (ADVIL,MOTRIN) tablet 600 mg  600 mg Oral Q6H PRN Jolanta B Pucilowska, MD   600 mg at 01/16/15 1356  . iohexol (OMNIPAQUE) 240 MG/ML injection 50 mL  50 mL Oral Once PRN Medication Radiologist, MD      . levothyroxine (SYNTHROID, LEVOTHROID) tablet 25 mcg  25 mcg Oral QAC breakfast Audery Amel, MD   25 mcg at 01/27/15 0931  . lithium carbonate capsule 300 mg  300 mg Oral TID WC Jolanta B Pucilowska, MD   300 mg at 01/27/15 0931  . LORazepam (ATIVAN) tablet 2 mg  2 mg Oral QHS Shari Prows, MD   2 mg at 01/26/15 2135  . magnesium citrate solution 1 Bottle  1 Bottle Oral Once Shari Prows, MD   1 Bottle at 01/10/15 1432  . magnesium hydroxide (MILK OF MAGNESIA) suspension 30 mL  30 mL Oral Daily PRN Audery Amel, MD   30 mL at 01/21/15 1743  . paliperidone (INVEGA SUSTENNA) injection 234 mg  234 mg Intramuscular Q28 days Shari Prows, MD   234 mg at 01/01/15 0943  . promethazine (PHENERGAN) tablet 25 mg  25 mg Oral Q6H PRN Shari Prows, MD   25 mg at 01/15/15 1837  . simvastatin (ZOCOR) tablet 20 mg  20 mg Oral q1800 Audery Amel, MD   20 mg at 01/25/15 1740  . tamsulosin (FLOMAX) capsule 0.4 mg  0.4 mg Oral QPC supper Audery Amel, MD   0.4 mg at 01/25/15 1740  . terazosin (HYTRIN) capsule 1 mg  1 mg Oral QHS Audery Amel, MD   1 mg at 01/26/15 2135  . traZODone (DESYREL) tablet 300 mg  300 mg Oral QHS Shari Prows, MD   100 mg at 01/26/15 2135    Lab Results: No results found for this or any previous visit (from the past 48 hour(s)).  Physical Findings: AIMS:  , ,  ,  ,    CIWA:    COWS:     Musculoskeletal: Strength & Muscle Tone: within normal limits Gait & Station: normal Patient leans: N/A  Psychiatric Specialty Exam: Review of Systems  Gastrointestinal: Positive for abdominal pain.  Psychiatric/Behavioral:  Positive for depression. The patient has insomnia.   All other systems reviewed and are negative.   Blood pressure 95/65, pulse 87, temperature 97.7 F (36.5 C), temperature source Oral, resp. rate 20, height 6' (1.829 m), weight 175 lb (79.379 kg), SpO2 99 %.Body mass index  is 23.73 kg/(m^2).  General Appearance: Disheveled  Eye Contact::  Poor  Speech:  Pressured  Volume:  Increased  Mood:  Angry, Dysphoric and Irritable  Affect:  Inappropriate and Labile  Thought Process:  Disorganized  Orientation:  Full (Time, Place, and Person)  Thought Content:  Delusions and Paranoid Ideation  Suicidal Thoughts:  Yes.  with intent/plan  Homicidal Thoughts:  Yes.  with intent/plan  Memory:  Immediate;   Fair Recent;   Fair Remote;   Fair  Judgement:  Poor  Insight:  Lacking  Psychomotor Activity:  EPS and Increased  Concentration:  Fair  Recall:  [employer]Fair  Fund of Knowledge:Fair  Language: Fair  Akathisia:  No  Handed:  Right  AIMS (if indicated):     Assets:  Communication Skills Desire for Improvement Financial Resources/Insurance Physical Health Resilience  ADL's:  Intact  Cognition: WNL  Sleep:  Number of Hours: 2.5   Treatment Plan Summary: Daily contact with patient to assess and evaluate symptoms and progress in treatment and Medication management   [name] is a 54 year old male with long history of schizoaffective disorder admitted for agitated and threatening behavior at the group home in the context of partial medication compliance.  1. Agitation. [name] is agitated  at times.  2. Mood and psychosis. He is maintained on injections of Prolixin and Invega sustenna for psychosis.  He has been taking Depakote 500 mg at bedtime.  Will continue lithium 300 mg tid for mood stabilization. He has a history of high ammonia on Depakote.  3. Hypothyroidism. Continue Synthroid. TSH is normal.  4. HIV. Continue Atripla.  5. Hypertension. Continue antihypertensives. VS well  controlled  6. Dyslipidemia. Continue Zocor. Lipid panel is not elevated.  7. BPH. We will continue Flomax and Terazosin.  8. Social. The patient is not allowed to return to his group home. Currently homeless. Unable to care for self without supervision.  9. Insomnia. Sleep continues to be poor spite of treatment with Restoril, Ambien and Trazodone. Will offer Ativan tonight.   10. Constipation. He is on bowel regimen.   11. Inguinal hernia. Medicine input is greatly appreciated. He complains of pain not eased with Tylenol. We started Motrin.   12. Vomiting. Resolved.   13. Urinary incontinence. We started DDAVP.   14. Disposition. To be established. He can not be placed as he was refused PASSR. He is on wait list for [facility].       Brandy HaleUzma Tajon Moring 01/27/2015, 3:47 PM

## 2015-01-27 NOTE — Progress Notes (Signed)
Pt continues to need frequent redirecting. Pt refused his 1200hr dose of lithium but has been compliant with all other meds. Pt has not attended any unit activities . Pt's mood and affect remains angry and some what hostile.

## 2015-01-28 LAB — AMMONIA: Ammonia: 33 umol/L (ref 9–35)

## 2015-01-28 MED ORDER — TAMSULOSIN HCL 0.4 MG PO CAPS
0.4000 mg | ORAL_CAPSULE | Freq: Every day | ORAL | Status: DC
  Administered 2015-01-28 – 2015-01-30 (×3): 0.4 mg via ORAL
  Filled 2015-01-28 (×2): qty 1

## 2015-01-28 MED ORDER — QUETIAPINE FUMARATE 100 MG PO TABS
100.0000 mg | ORAL_TABLET | Freq: Every day | ORAL | Status: DC
  Administered 2015-01-28 – 2015-01-30 (×3): 100 mg via ORAL
  Filled 2015-01-28 (×3): qty 1

## 2015-01-28 MED ORDER — LITHIUM CARBONATE 300 MG PO CAPS: 300.0000 mg | ORAL_CAPSULE | Freq: Two times a day (BID) | ORAL | Status: DC

## 2015-01-28 MED ORDER — LITHIUM CARBONATE 300 MG PO CAPS
300.0000 mg | ORAL_CAPSULE | Freq: Two times a day (BID) | ORAL | Status: DC
  Administered 2015-01-28 – 2015-01-29 (×2): 300 mg via ORAL
  Filled 2015-01-28 (×2): qty 1

## 2015-01-28 MED ORDER — SIMVASTATIN 20 MG PO TABS
20.0000 mg | ORAL_TABLET | Freq: Every day | ORAL | Status: DC
  Administered 2015-01-28 – 2015-01-30 (×3): 20 mg via ORAL
  Filled 2015-01-28 (×3): qty 1

## 2015-01-28 NOTE — Progress Notes (Signed)
Recreation Therapy Notes  Date: 11.07.16 Time: 3:00 pm Location: Craft Room  Group Topic: Self-expression  Goal Area(s) Addresses:  Patient will identify one color per emotion listed on wheel. Patient will verbalize benefit of using art as a means of self-expression. Patient will verbalize one emotion experienced during session. Patient will be educated on other forms of self-expression.  Behavioral Response: Did not attend  Intervention: Emotion Wheel  Activity: Patients were given an emotion wheel worksheet and instructed to pick a color for each emotion on the worksheet.    Education: LRT educated patients on different forms of self-expression.  Education Outcome: Patient did not attend group.   Clinical Observations/Feedback: Patient did not attend group.  Jacquelynn CreeGreene,Jessee Mezera M, LRT/CTRS 01/28/2015 3:56 PM

## 2015-01-28 NOTE — Progress Notes (Signed)
He is still loud & intrusive.Denies suicidal ideation & hallucination.Compliant with medications except the evening coreg.Stated that Dr.P change all his meds & he will take only 9am & 9pm.Did not attend groups.

## 2015-01-28 NOTE — [facility] Group Notes (Signed)
[facility] Group Notes:  (Nursing/MHT/Case Management/Adjunct)  Date:  01/28/2015  Time:  12:31 PM  Type of Therapy:  Psychoeducational Skills  Participation Level:  None  Participation Quality:  Inattentive  Affect:  Blunted  Cognitive:  Appropriate  Insight:  Improving  Engagement in Group:  Off Topic  Modes of Intervention:  Clarification, Discussion and Education  Summary of Progress/Problems:  [name] 01/28/2015, 12:31 PM

## 2015-01-28 NOTE — Progress Notes (Signed)
Patient was pleasant and cooperative, but continues to speak loudly and present with an angry affect. During med pass he did take some of the pills out of the cup as well as refusing a portion of his meds. [facility]. He continues to state he wants to leave to the street and is unhappy here. He did have a moment of insight, understanding his behavior has not shown his ability to be healthily independent. He complained of hernia pain this evening, which he has not done for the past 4 nights. He declined further treatment of assessment. No current SI, HI, or AVH.

## 2015-01-28 NOTE — Progress Notes (Addendum)
[facility] MD Progress Note  01/28/2015 2:07 PM [name]  MRN:  [id number]  Subjective:  Mr. Grover remains loud, agitated, intrusive, disruptive, and difficult to redirect. Again he left me copies to inform me that he wants to kill someone in order to be transferred to jail and prison. He insists to be discharged immediately. He is waiting list for [facility] sleep are unable to stabilize this unfortunate patient. He denies any symptoms of depression anxiety or psychosis. He puts forward somatic problems. Over the weekend he had a chest x-ray that was negative. This morning he accepted all medications including lithium but had vomited everything.  Principal Problem: Schizoaffective disorder, bipolar type (HCC) Diagnosis:   Patient Active Problem List   Diagnosis Date Noted  . Scrotal pain [N50.82] 01/11/2015  . Tobacco use disorder [F17.200] 12/27/2014  . HIV positive (HCC) [Z21] 12/26/2014  . Hypertension [I10] 12/26/2014  . Hypothyroidism [E03.9] 12/26/2014  . Dyslipidemia [E78.5] 12/26/2014  . Schizoaffective disorder, bipolar type (HCC) [F25.0] 10/25/2014  . Alcohol abuse [F10.10] 10/25/2014   Total Time spent with patient: 20 minutes  Past Psychiatric History: schizoaffective disorder bipolar type.  Past Medical History:  Past Medical History  Diagnosis Date  . HIV (human immunodeficiency virus infection) (HCC)   . BPH (benign prostatic hypertrophy)   . Dyslipidemia   . Hypertension   . Hypothyroidism   . Schizoaffective disorder (HCC)    History reviewed. No pertinent past surgical history. Family History:  Family History  Problem Relation Age of Onset  . Hypertension Other    Family Psychiatric  History: with schizophrenia. Social History:  History  Alcohol Use  . 1.2 - 1.8 oz/week  . 2-3 Cans of beer per week     History  Drug Use No    Social History   Social History  . Marital Status: Single    Spouse Name: N/A  . Number of Children: N/A  .  Years of Education: N/A   Social History Main Topics  . Smoking status: Current Every Day Smoker -- 2.00 packs/day    Types: Cigarettes  . Smokeless tobacco: None  . Alcohol Use: 1.2 - 1.8 oz/week    2-3 Cans of beer per week  . Drug Use: No  . Sexual Activity: Not Asked   Other Topics Concern  . None   Social History Narrative   Additional Social History:                         Sleep: Fair  Appetite:  Poor  Current Medications: Current Facility-Administered Medications  Medication Dose Route Frequency Provider Last Rate Last Dose  . acetaminophen (TYLENOL) tablet 650 mg  650 mg Oral Q6H PRN Audery Amel, MD   650 mg at 01/16/15 0548  . alum & mag hydroxide-simeth (MAALOX/MYLANTA) 200-200-20 MG/5ML suspension 30 mL  30 mL Oral Q4H PRN Audery Amel, MD   30 mL at 01/15/15 1837  . aspirin EC tablet 81 mg  81 mg Oral QAC breakfast Shari Prows, MD   81 mg at 01/28/15 0856  . benztropine (COGENTIN) tablet 2 mg  2 mg Oral QAC breakfast Shari Prows, MD   2 mg at 01/28/15 0856  . carvedilol (COREG) tablet 3.125 mg  3.125 mg Oral BID WC Audery Amel, MD   3.125 mg at 01/28/15 0857  . cholecalciferol (VITAMIN D) tablet 2,000 Units  2,000 Units Oral QAC breakfast Shari Prows, MD  2,000 Units at 01/28/15 0856  . desmopressin (DDAVP) tablet 0.2 mg  0.2 mg Oral QHS Sotero Brinkmeyer B Aizza Santiago, MD   0.2 mg at 01/27/15 2221  . divalproex (DEPAKOTE ER) 24 hr tablet 1,000 mg  1,000 mg Oral QHS Shari Prows, MD   500 mg at 01/27/15 2221  . efavirenz-emtricitabine-tenofovir (ATRIPLA) 600-200-300 MG per tablet 1 tablet  1 tablet Oral QHS Audery Amel, MD   1 tablet at 01/27/15 2221  . fluPHENAZine decanoate (PROLIXIN) injection 50 mg  50 mg Intramuscular Q14 Days Lennon Richins B Ayad Nieman, MD   50 mg at 01/15/15 0907  . hydrOXYzine (ATARAX/VISTARIL) tablet 50 mg  50 mg Oral QHS Shari Prows, MD   50 mg at 01/26/15 2135  . ibuprofen (ADVIL,MOTRIN)  tablet 600 mg  600 mg Oral Q6H PRN Shyana Kulakowski B Harshal Sirmon, MD   600 mg at 01/16/15 1356  . iohexol (OMNIPAQUE) 240 MG/ML injection 50 mL  50 mL Oral Once PRN Medication Radiologist, MD      . levothyroxine (SYNTHROID, LEVOTHROID) tablet 25 mcg  25 mcg Oral QAC breakfast Audery Amel, MD   25 mcg at 01/28/15 0857  . lithium carbonate capsule 300 mg  300 mg Oral TID WC Vernestine Brodhead B Pam Vanalstine, MD   300 mg at 01/28/15 1251  . LORazepam (ATIVAN) tablet 2 mg  2 mg Oral QHS Shari Prows, MD   2 mg at 01/27/15 2221  . magnesium citrate solution 1 Bottle  1 Bottle Oral Once Shari Prows, MD   1 Bottle at 01/10/15 1432  . magnesium hydroxide (MILK OF MAGNESIA) suspension 30 mL  30 mL Oral Daily PRN Audery Amel, MD   30 mL at 01/21/15 1743  . paliperidone (INVEGA SUSTENNA) injection 234 mg  234 mg Intramuscular Q28 days Shari Prows, MD   234 mg at 01/01/15 0943  . promethazine (PHENERGAN) tablet 25 mg  25 mg Oral Q6H PRN Shari Prows, MD   25 mg at 01/15/15 1837  . simvastatin (ZOCOR) tablet 20 mg  20 mg Oral q1800 Audery Amel, MD   20 mg at 01/27/15 1749  . tamsulosin (FLOMAX) capsule 0.4 mg  0.4 mg Oral QPC supper Audery Amel, MD   0.4 mg at 01/27/15 1749  . terazosin (HYTRIN) capsule 1 mg  1 mg Oral QHS Audery Amel, MD   1 mg at 01/26/15 2135  . traZODone (DESYREL) tablet 300 mg  300 mg Oral QHS Shari Prows, MD   100 mg at 01/27/15 2221    Lab Results: No results found for this or any previous visit (from the past 48 hour(s)).  Physical Findings: AIMS:  , ,  ,  ,    CIWA:    COWS:     Musculoskeletal: Strength & Muscle Tone: within normal limits Gait & Station: normal Patient leans: N/A  Psychiatric Specialty Exam: Review of Systems  Gastrointestinal: Positive for vomiting.  All other systems reviewed and are negative.   Blood pressure 138/82, pulse 84, temperature 97.6 F (36.4 C), temperature source Oral, resp. rate 20, height 6' (1.829  m), weight 79.379 kg (175 lb), SpO2 99 %.Body mass index is 23.73 kg/(m^2).  General Appearance: Disheveled  Eye Solicitor::  Fair  Speech:  Pressured  Volume:  Increased  Mood:  Angry, Dysphoric and Irritable  Affect:  Congruent  Thought Process:  Disorganized  Orientation:  Full (Time, Place, and Person)  Thought Content:  Delusions and Paranoid  Ideation  Suicidal Thoughts:  Yes.  with intent/plan  Homicidal Thoughts:  Yes.  with intent/plan  Memory:  Immediate;   Fair Recent;   Fair Remote;   Fair  Judgement:  Poor  Insight:  Lacking  Psychomotor Activity:  Increased  Concentration:  Fair  Recall:  [employer]Fair  Fund of Knowledge:Fair  Language: Fair  Akathisia:  No  Handed:  Right  AIMS (if indicated):     Assets:  Communication Skills Desire for Improvement Financial Resources/Insurance Physical Health Resilience  ADL's:  Intact  Cognition: WNL  Sleep:  Number of Hours: 4.5   Treatment Plan Summary: Daily contact with patient to assess and evaluate symptoms and progress in treatment and Medication management   [name] is a 54 year old male with long history of schizoaffective disorder admitted for agitated and threatening behavior at the group home in the context of partial medication compliance.  1. Agitation. [name] is agitated at times.  2. Mood and psychosis. He is maintained on injections of Prolixin and Invega sustenna for psychosis. He has been taking Depakote 1000 mg at bedtime. Will continue lithium 300 mg tid for mood stabilization. He has a history of high ammonia on Depakote. Will check Lithium, VPA, and ammonia level. [name] agreed to  TAKE LITHIUM UNDER THE CONDITION THAT IT WILL BE GIVEN TWICE DAILY. I made the change. We'll negotiate felt better after levels are available.  3. Hypothyroidism. Continue Synthroid. TSH is normal.  4. HIV. Continue Atripla.  5. Hypertension. Continue antihypertensives. VS well controlled  6. Dyslipidemia. Continue  Zocor. Lipid panel is not elevated.  7. BPH. We will continue Flomax and Terazosin.  8. Social. The patient is not allowed to return to his group home. Currently homeless. Unable to care for self without supervision.  9. Insomnia. Sleep continues to be poor in spite of treatment with Restoril, Ambien, Trazodone and Ativan.  I will add Seroquel. I am aware that his is the third antipsychotic.   10. Constipation. He is on bowel regimen.   11. Inguinal hernia. Medicine input is greatly appreciated. He complains of pain not eased with Tylenol. We started Motrin.   12. Vomiting. We start Protonix and check labs.    13. Urinary incontinence. We started DDAVP.   14. Disposition. To be established. He can not be placed as he was refused PASSR. He is on wait list for [facility].    Breyden Jeudy 01/28/2015, 2:07 PM

## 2015-01-28 NOTE — [facility] Group Notes (Signed)
[facility] LCSW Group Therapy  01/28/2015 4:30 PM  Type of Therapy:  Group Therapy  Participation Level:  Minimal  Participation Quality:  Appropriate and Attentive  Affect:  Appropriate  Cognitive:  Disorganized  Insight:  Limited  Engagement in Therapy:  Limited  Modes of Intervention:  Socialization and Support  Summary of Progress/Problems: Patient attended group and participated minimally in group discussion. Patient arrived late and did not have the opportunity to share his self care activity but did introduce himself. Patient participated minimally in group discussion but was attentive throughout group session AEB his body language and eye contact. After group, patient shared that he would like to send a close friend a birthday card and would like some help in being able to do this by Friday 02/01/15.   Lulu RidingIngle, Anijah Spohr T, MSW, LCSWA 01/28/2015, 4:30 PM

## 2015-01-29 LAB — CBC
HEMATOCRIT: 39.1 % — AB (ref 40.0–52.0)
HEMOGLOBIN: 12.9 g/dL — AB (ref 13.0–18.0)
MCH: 31.9 pg (ref 26.0–34.0)
MCHC: 32.9 g/dL (ref 32.0–36.0)
MCV: 96.9 fL (ref 80.0–100.0)
PLATELETS: 223 10*3/uL (ref 150–440)
RBC: 4.04 MIL/uL — AB (ref 4.40–5.90)
RDW: 14.2 % (ref 11.5–14.5)
WBC: 4.6 10*3/uL (ref 3.8–10.6)

## 2015-01-29 LAB — COMPREHENSIVE METABOLIC PANEL
ALBUMIN: 3.4 g/dL — AB (ref 3.5–5.0)
ALK PHOS: 81 U/L (ref 38–126)
ALT: 7 U/L — ABNORMAL LOW (ref 17–63)
ANION GAP: 4 — AB (ref 5–15)
AST: 13 U/L — AB (ref 15–41)
BILIRUBIN TOTAL: 0.4 mg/dL (ref 0.3–1.2)
BUN: 11 mg/dL (ref 6–20)
CALCIUM: 8.5 mg/dL — AB (ref 8.9–10.3)
CO2: 27 mmol/L (ref 22–32)
Chloride: 104 mmol/L (ref 101–111)
Creatinine, Ser: 0.94 mg/dL (ref 0.61–1.24)
GFR calc Af Amer: >60 mL/min (ref >60)
GFR calc non Af Amer: >60 mL/min (ref >60)
GLUCOSE: 75 mg/dL (ref 65–99)
POTASSIUM: 4.2 mmol/L (ref 3.5–5.1)
SODIUM: 135 mmol/L (ref 135–145)
TOTAL PROTEIN: 6.2 g/dL — AB (ref 6.5–8.1)

## 2015-01-29 LAB — LITHIUM LEVEL: Lithium Lvl: 0.36 mmol/L — ABNORMAL LOW (ref 0.60–1.20)

## 2015-01-29 LAB — LIPASE, BLOOD: Lipase: 23 U/L (ref 11–51)

## 2015-01-29 LAB — VALPROIC ACID LEVEL: Valproic Acid Lvl: 44 ug/mL — ABNORMAL LOW (ref 50.0–100.0)

## 2015-01-29 MED ORDER — LITHIUM CARBONATE 300 MG PO CAPS: 450.0000 mg | ORAL_CAPSULE | Freq: Two times a day (BID) | ORAL | Status: DC

## 2015-01-29 MED ORDER — CARVEDILOL 6.25 MG PO TABS
3.1250 mg | ORAL_TABLET | Freq: Two times a day (BID) | ORAL | Status: DC
  Administered 2015-01-29 – 2015-01-31 (×3): 3.125 mg via ORAL
  Filled 2015-01-29: qty 1
  Filled 2015-01-29: qty 2
  Filled 2015-01-29 (×2): qty 1

## 2015-01-29 MED ORDER — LITHIUM CARBONATE ER 450 MG PO TBCR
450.0000 mg | EXTENDED_RELEASE_TABLET | Freq: Two times a day (BID) | ORAL | Status: DC
  Administered 2015-01-29 – 2015-01-31 (×4): 450 mg via ORAL
  Filled 2015-01-29 (×5): qty 1

## 2015-01-29 MED ORDER — LITHIUM CARBONATE 300 MG PO CAPS
450.0000 mg | ORAL_CAPSULE | Freq: Two times a day (BID) | ORAL | Status: DC
  Filled 2015-01-29 (×2): qty 1

## 2015-01-29 MED ORDER — DIVALPROEX SODIUM 500 MG PO DR TAB
500.0000 mg | DELAYED_RELEASE_TABLET | Freq: Every day | ORAL | Status: DC
  Administered 2015-01-30 – 2015-01-31 (×2): 500 mg via ORAL
  Filled 2015-01-29 (×2): qty 1

## 2015-01-29 NOTE — Progress Notes (Signed)
[facility] MD Progress Note  01/29/2015 2:21 PM [name]  MRN:  [id number]  Subjective:  [name] is still loud, irritable, intrusive, agitated easily. He again delivered some letters to me with [place]. This include suggestion that his medications be given twice a day only or as she would refuse. He still demands to be discharged immediately preferably to prison. He took his wristband of today and threatens to leave the hospital immediately. [facility].  Principal Problem: Schizoaffective disorder, bipolar type ([place]) Diagnosis:   Patient Active Problem List   Diagnosis Date Noted  . Scrotal pain [N50.82] 01/11/2015  . Tobacco use disorder [F17.200] 12/27/2014  . HIV positive ([place]) [Z21] 12/26/2014  . Hypertension [I10] 12/26/2014  . Hypothyroidism [E03.9] 12/26/2014  . Dyslipidemia [E78.5] 12/26/2014  . Schizoaffective disorder, bipolar type ([place]) [F25.0] 10/25/2014  . Alcohol abuse [F10.10] 10/25/2014   Total Time spent with patient: 20 minutes  Past Psychiatric History: Schizoaffective disorder bipolar type.  Past Medical History:  Past Medical History  Diagnosis Date  . HIV (human immunodeficiency virus infection) ([name])   . BPH (benign prostatic hypertrophy)   . Dyslipidemia   . Hypertension   . Hypothyroidism   . Schizoaffective disorder ([place])    History reviewed. No pertinent past surgical history. Family History:  Family History  Problem Relation Age of Onset  . Hypertension Other    Family Psychiatric  History: Aunt with schizophrenia. Social History:  History  Alcohol Use  . 1.2 - 1.8 oz/week  . 2-3 Cans of beer per week     History  Drug Use No    Social History   Social History  . Marital Status: Single    Spouse Name: N/A  . Number of Children: N/A  . Years of Education: N/A   Social History Main Topics  . Smoking status: Current Every Day Smoker -- 2.00 packs/day    Types: Cigarettes  . Smokeless  tobacco: None  . Alcohol Use: 1.2 - 1.8 oz/week    2-3 Cans of beer per week  . Drug Use: No  . Sexual Activity: Not Asked   Other Topics Concern  . None   Social History Narrative   Additional Social History:                         Sleep: Fair  Appetite:  Fair  Current Medications: Current Facility-Administered Medications  Medication Dose Route Frequency Provider Last Rate Last Dose  . acetaminophen (TYLENOL) tablet 650 mg  650 mg Oral Q6H PRN Gonzella Lex, MD   650 mg at 01/16/15 0548  . alum & mag hydroxide-simeth (MAALOX/MYLANTA) 200-200-20 MG/5ML suspension 30 mL  30 mL Oral Q4H PRN Gonzella Lex, MD   30 mL at 01/15/15 1837  . aspirin EC tablet 81 mg  81 mg Oral QAC breakfast Clovis Fredrickson, MD   81 mg at 01/29/15 0807  . benztropine (COGENTIN) tablet 2 mg  2 mg Oral QAC breakfast Clovis Fredrickson, MD   2 mg at 01/29/15 0807  . carvedilol (COREG) tablet 3.125 mg  3.125 mg Oral BID AC & HS Jolanta B Pucilowska, MD      . cholecalciferol (VITAMIN D) tablet 2,000 Units  2,000 Units Oral QAC breakfast Clovis Fredrickson, MD   2,000 Units at 01/29/15 0807  . desmopressin (DDAVP) tablet 0.2 mg  0.2 mg Oral QHS Clovis Fredrickson, MD  0.2 mg at 01/28/15 2041  . divalproex (DEPAKOTE ER) 24 hr tablet 1,000 mg  1,000 mg Oral QHS Jolanta B Pucilowska, MD   1,000 mg at 01/28/15 2031  . efavirenz-emtricitabine-tenofovir (ATRIPLA) 600-200-300 MG per tablet 1 tablet  1 tablet Oral QHS Gonzella Lex, MD   1 tablet at 01/28/15 2032  . fluPHENAZine decanoate (PROLIXIN) injection 50 mg  50 mg Intramuscular Q14 Days Jolanta B Pucilowska, MD   50 mg at 01/29/15 0814  . hydrOXYzine (ATARAX/VISTARIL) tablet 50 mg  50 mg Oral QHS Clovis Fredrickson, MD   50 mg at 01/28/15 2033  . ibuprofen (ADVIL,MOTRIN) tablet 600 mg  600 mg Oral Q6H PRN Jolanta B Pucilowska, MD   600 mg at 01/16/15 1356  . iohexol (OMNIPAQUE) 240 MG/ML injection 50 mL  50 mL Oral Once PRN Medication  Radiologist, MD      . levothyroxine (SYNTHROID, LEVOTHROID) tablet 25 mcg  25 mcg Oral QAC breakfast Gonzella Lex, MD   25 mcg at 01/29/15 0810  . lithium carbonate capsule 300 mg  300 mg Oral BID AC & HS Jolanta B Pucilowska, MD   300 mg at 01/29/15 0807  . LORazepam (ATIVAN) tablet 2 mg  2 mg Oral QHS Clovis Fredrickson, MD   2 mg at 01/28/15 2044  . magnesium citrate solution 1 Bottle  1 Bottle Oral Once Clovis Fredrickson, MD   1 Bottle at 01/10/15 1432  . magnesium hydroxide (MILK OF MAGNESIA) suspension 30 mL  30 mL Oral Daily PRN Gonzella Lex, MD   30 mL at 01/28/15 2050  . paliperidone (INVEGA SUSTENNA) injection 234 mg  234 mg Intramuscular Q28 days Clovis Fredrickson, MD   234 mg at 01/29/15 0811  . promethazine (PHENERGAN) tablet 25 mg  25 mg Oral Q6H PRN Clovis Fredrickson, MD   25 mg at 01/15/15 1837  . QUEtiapine (SEROQUEL) tablet 100 mg  100 mg Oral QHS Clovis Fredrickson, MD   100 mg at 01/28/15 2035  . simvastatin (ZOCOR) tablet 20 mg  20 mg Oral QHS Clovis Fredrickson, MD   20 mg at 01/28/15 2042  . tamsulosin (FLOMAX) capsule 0.4 mg  0.4 mg Oral QHS Jolanta B Pucilowska, MD   0.4 mg at 01/28/15 2042  . terazosin (HYTRIN) capsule 1 mg  1 mg Oral QHS Gonzella Lex, MD   1 mg at 01/28/15 2035  . traZODone (DESYREL) tablet 300 mg  300 mg Oral QHS Clovis Fredrickson, MD   300 mg at 01/28/15 2031    Lab Results:  Results for orders placed or performed during the hospital encounter of 12/26/14 (from the past 48 hour(s))  Ammonia     Status: None   Collection Time: 01/28/15  2:44 PM  Result Value Ref Range   Ammonia 33 9 - 35 umol/L  CBC     Status: Abnormal   Collection Time: 01/29/15  7:59 AM  Result Value Ref Range   WBC 4.6 3.8 - 10.6 K/uL   RBC 4.04 (L) 4.40 - 5.90 MIL/uL   Hemoglobin 12.9 (L) 13.0 - 18.0 g/dL   HCT 39.1 (L) 40.0 - 52.0 %   MCV 96.9 80.0 - 100.0 fL   MCH 31.9 26.0 - 34.0 pg   MCHC 32.9 32.0 - 36.0 g/dL   RDW 14.2 11.5 - 14.5 %    Platelets 223 150 - 440 K/uL  Valproic acid level     Status: Abnormal  Collection Time: 01/29/15  7:59 AM  Result Value Ref Range   Valproic Acid Lvl 44 (L) 50.0 - 100.0 ug/mL  Comprehensive metabolic panel     Status: Abnormal   Collection Time: 01/29/15  7:59 AM  Result Value Ref Range   Sodium 135 135 - 145 mmol/L   Potassium 4.2 3.5 - 5.1 mmol/L   Chloride 104 101 - 111 mmol/L   CO2 27 22 - 32 mmol/L   Glucose, Bld 75 65 - 99 mg/dL   BUN 11 6 - 20 mg/dL   Creatinine, Ser 0.94 0.61 - 1.24 mg/dL   Calcium 8.5 (L) 8.9 - 10.3 mg/dL   Total Protein 6.2 (L) 6.5 - 8.1 g/dL   Albumin 3.4 (L) 3.5 - 5.0 g/dL   AST 13 (L) 15 - 41 U/L   ALT 7 (L) 17 - 63 U/L   Alkaline Phosphatase 81 38 - 126 U/L   Total Bilirubin 0.4 0.3 - 1.2 mg/dL   GFR calc non Af Amer >60 >60 mL/min   GFR calc Af Amer >60 >60 mL/min    Comment: (NOTE) The eGFR has been calculated using the CKD EPI equation. This calculation has not been validated in all clinical situations. eGFR's persistently <60 mL/min signify possible Chronic Kidney Disease.    Anion gap 4 (L) 5 - 15  Lipase, blood     Status: None   Collection Time: 01/29/15  7:59 AM  Result Value Ref Range   Lipase 23 11 - 51 U/L  Lithium level     Status: Abnormal   Collection Time: 01/29/15  8:04 AM  Result Value Ref Range   Lithium Lvl 0.36 (L) 0.60 - 1.20 mmol/L    Physical Findings: AIMS:  , ,  ,  ,    CIWA:    COWS:     Musculoskeletal: Strength & Muscle Tone: within normal limits Gait & Station: normal Patient leans: N/A  Psychiatric Specialty Exam: Review of Systems  Gastrointestinal: Positive for abdominal pain.  All other systems reviewed and are negative.   Blood pressure 106/70, pulse 69, temperature 98.4 F (36.9 C), temperature source Oral, resp. rate 18, height 6' (1.829 m), weight 79.379 kg (175 lb), SpO2 99 %.Body mass index is 23.73 kg/(m^2).  General Appearance: Disheveled  Eye Sport and exercise psychologist::  Fair  Speech:  Pressured   Volume:  Increased  Mood:  Angry, Dysphoric and Irritable  Affect:  Inappropriate and Labile  Thought Process:  Disorganized  Orientation:  Full (Time, Place, and Person)  Thought Content:  Delusions and Paranoid Ideation  Suicidal Thoughts:  Yes.  with intent/plan  Homicidal Thoughts:  Yes.  with intent/plan  Memory:  Immediate;   Fair Recent;   Fair Remote;   Fair  Judgement:  Poor  Insight:  Lacking  Psychomotor Activity:  Increased  Concentration:  Fair  Recall:  [employer] of Knowledge:Fair  Language: Fair  Akathisia:  No  Handed:  Right  AIMS (if indicated):     Assets:  Communication Skills Desire for Improvement Financial Resources/Insurance Resilience  ADL's:  Intact  Cognition: WNL  Sleep:  Number of Hours: 8.25   Treatment Plan Summary: Daily contact with patient to assess and evaluate symptoms and progress in treatment and Medication management   Mr. Low is a 54 year old male with long history of schizoaffective disorder admitted for agitated and threatening behavior at the group home in the context of partial medication compliance.  1. Agitation. Mr. Celmer is agitated at times.  2. Mood and psychosis. He is maintained on injections of Prolixin and Invega sustenna for psychosis. He has been taking Depakote 1000 mg at bedtime. Will continue lithium 300 mg tid for mood stabilization. He has a history of high ammonia on Depakote. Will check Lithium, VPA, and ammonia level. Mr. Belote agreed to take lithium under the condition that it will be given twice daily. Both lithium and VPA levels are subtherapeutic. Ammonia level is normal. Will increase Litium and Depakote.   3. Hypothyroidism. Continue Synthroid. TSH is normal.  4. HIV. Continue Atripla.  5. Hypertension. Continue antihypertensives. VS well controlled  6. Dyslipidemia. Continue Zocor. Lipid panel is not elevated.  7. BPH. We will continue Flomax and Terazosin.  8. Social. The patient is not  allowed to return to his group home. Currently homeless. Unable to care for self without supervision.  9. Insomnia. Sleep continues to be poor in spite of treatment with Restoril, Ambien, Trazodone and Ativan. I will add Seroquel. I am aware that his is the third antipsychotic.   10. Constipation. He is on bowel regimen.   11. Inguinal hernia. Medicine input is greatly appreciated. He complains of pain not eased with Tylenol. We started Motrin.   12. Vomiting. We start Protonix. Chemistries are unremarkable.   13. Urinary incontinence. We started DDAVP.   14. Disposition. To be established. He can not be placed as he was refused PASSR. He is on wait list for [place].     Jolanta Pucilowska 01/29/2015, 2:21 PM

## 2015-01-29 NOTE — Progress Notes (Signed)
Recreation Therapy Notes  Date: 11.08.16 Time: 3:00 pm Location: Craft Room  Group Topic: Goal Setting  Goal Area(s) Addresses:  Patient will write down at least one goal. Patient will write down at least one obstacle.  Behavioral Response: Did not attend  Intervention: Recovery Goal Chart  Activity: Patients were instructed to make a Recovery Goal Chart with goals, obstacles, the date they started working on their goals, and the date they achieved their goals.   Education: LRT educated patients on healthy ways they can celebrate reaching their goals.  Education Outcome: Patient did not attend group.  Clinical Observations/Feedback: Patient did not attend group.  Jacquelynn CreeGreene,Debbrah Sampedro M, LRT/CTRS 01/29/2015 4:02 PM

## 2015-01-29 NOTE — [facility] Group Notes (Signed)
[facility] Group Notes:  (Nursing/MHT/Case Management/Adjunct)  Date:  01/29/2015  Time:  2:00 PM  Type of Therapy:  Psychoeducational Skills  Participation Level:  Did Not Attend  [place] Oldmanda Lea Campbell 01/29/2015, 2:00 PM

## 2015-01-29 NOTE — Progress Notes (Signed)
Pt remains loud. Med compliant. Denies SI/HI/AV/H. No behavior problems noted. No c/o pain/discomfort noted.

## 2015-01-29 NOTE — Plan of Care (Signed)
Problem: Ineffective individual coping Goal: LTG: Patient will report a decrease in negative feelings Outcome: Not Progressing Limited insight into behavior

## 2015-01-29 NOTE — [facility] Group Notes (Signed)
[facility] LCSW Group Therapy  01/29/2015 1:47 PM  Type of Therapy:  Group Therapy  Participation Level:  Did Not Attend   Nyasiah Moffet T, MSW, LCSWA 01/29/2015, 1:47 PM  

## 2015-01-29 NOTE — Progress Notes (Signed)
D: Patient stated slept good last night .Stated appetite is good and energy level  Is normal. Stated concentration is good . Stated on Depression scale 0, hopeless 0 and anxiety 0. ( low 0-10 high) Denies suicidal  homicidal ideations  .  No auditory hallucinations  No pain concerns . Appropriate ADL'S. Interacting with peers and staff. Patient remains close to bed  Out for meals . Continue to talk loudly , agitated , labile . Patient met with ACT Team this shift.  A: Encourage patient participation with unit programming . Instruction  Given on  Medication , verbalize understanding. R: Voice no other concerns. Staff continue to monitor

## 2015-01-29 NOTE — Tx Team (Signed)
Interdisciplinary Treatment Plan Update (Adult)  Date:  01/29/2015 Time Reviewed:  1:43 PM  Progress in Treatment: Attending groups: No. Participating in groups:  No. Taking medication as prescribed:  Yes. Tolerating medication:  Yes. Family/Significant othe contact made:  Yes, individual(s) contacted:  ACT team  Patient understands diagnosis:  Yes. Discussing patient identified problems/goals with staff:  Yes. Medical problems stabilized or resolved:  Yes. Denies suicidal/homicidal ideation: No. and As evidenced by:  Threats made against staff and self Issues/concerns per patient self-inventory:  Yes. Other:  New problem(s) identified: No, Describe:  NA  Discharge Plan or Barriers: Pt wants to be discharged so he can commit a crime to go to jail. Patient is awainting a [place] bed. If stabilized, patient could be revisited by [facility]  and placed with followup [facility] ACT.  Reason for Continuation of Hospitalization: Aggression Homicidal ideation Mania Medication stabilization Suicidal ideation  Comments:[name] is no the waitlist for [facility] and is attending group but was angry and threatening this morning when he was woke up to meet his [facility] ACT team.    Estimated length of stay: 7 days expected discharge Tuesday 02/05/15  New goal(s):  Review of initial/current patient goals per problem list:   1.  Goal(s): Patient will participate in aftercare plan * Met:  * Target date: at discharge * As evidenced by: Patient will participate within aftercare plan AEB aftercare provider and housing plan at discharge being identified.   2.  Goal (s): Patient will exhibit decreased depressive symptoms and suicidal ideations. * Met:  *  Target date: at discharge * As evidenced by: Patient will utilize self rating of depression at 3 or below and demonstrate decreased signs of depression or be deemed stable for discharge by MD.  3.  Goal (s): Patient will demonstrate decreased symptoms of  mania. * Met: No  *  Target date: at discharge * As evidenced by: Patient will not endorse signs of mania or be deemed stable for discharge by MD.   Attendees: Physician:   Orson Slick, MD  11/8/20161:43 PM  Nursing:   Tyler Pita, RN  11/8/20161:43 PM  Other:  Keene Breath, [place] 11/8/20161:43 PM  Other:  Everitt Amber, [place]  11/8/20161:43 PM  Other:   11/8/20161:43 PM  Other:  11/8/20161:43 PM  Other:  11/8/20161:43 PM  Other:  11/8/20161:43 PM  Other:  11/8/20161:43 PM  Other:  11/8/20161:43 PM  Other:  11/8/20161:43 PM  Other:   11/8/20161:43 PM   Scribe for Treatment Team:   Carmell Austria T,MSW, [place]  01/29/2015, 1:43 PM

## 2015-01-30 NOTE — Progress Notes (Signed)
Pt remains agitated. Med compliant. Did not attend group. Denies SI/HI/HI. No c/o pain/discomfort noted.

## 2015-01-30 NOTE — Progress Notes (Signed)
D: Patient has been in his room sleeping most of the day except to go to meals. Mood has been labile at times when he complains about being in the hospital and wants to leave. Easily redirected. Denies SI/HI/AVH. A: On q 15 minute checks. Given meds. R: Loud at times. Easily redirected.

## 2015-01-30 NOTE — Plan of Care (Signed)
Problem: Ineffective individual coping Goal: STG: Patient will remain free from self harm Outcome: Progressing Medications administered as ordered by the physician, medications Therapeutic Effects, SEs and Adverse effects discussed, questions encouraged; no PRN given, 15 minute checks maintained for safety, clinical and moral support provided, patient encouraged to continue to express feelings and demonstrate safe care. Patient remain free from harm, will continue to monitor.         

## 2015-01-30 NOTE — Plan of Care (Signed)
Problem: Ineffective individual coping Goal: STG: Pt will be able to identify effective and ineffective STG: Pt will be able to identify effective and ineffective coping patterns  Outcome: Progressing Patient taking meds most of the time and attending some groups.

## 2015-01-30 NOTE — Plan of Care (Signed)
Problem: Ineffective individual coping Goal: STG: Patient will remain free from self harm Outcome: Not Applicable Date Met:  88/64/84 Pt remains agitated and angry. Med compliant. No voiced thoughts of hurting herself. No injuries noted.

## 2015-01-30 NOTE — Progress Notes (Signed)
[facility] MD Progress Note  01/30/2015 1:53 PM [name]  MRN:  [id number]  Subjective:  [name] continues to be loud, agitated at times and hard to redirect. He tells me that he will no longer take lithium because it causes all his other problems including hernia and abdominal pain. He demands to be discharged to prison immediately or else he will kill someone to get to prison. There is no improvement. I spoke to him at lunch. He seems to be very particular about his food. He separates me from potatoes and salad.   Principal Problem: Schizoaffective disorder, bipolar type ([place]) Diagnosis:   Patient Active Problem List   Diagnosis Date Noted  . Scrotal pain [N50.82] 01/11/2015  . Tobacco use disorder [F17.200] 12/27/2014  . HIV positive ([place]) [Z21] 12/26/2014  . Hypertension [I10] 12/26/2014  . Hypothyroidism [E03.9] 12/26/2014  . Dyslipidemia [E78.5] 12/26/2014  . Schizoaffective disorder, bipolar type ([place]) [F25.0] 10/25/2014  . Alcohol abuse [F10.10] 10/25/2014   Total Time spent with patient: 20 minutes  Past Psychiatric History: Schizoaffective disorder.  Past Medical History:  Past Medical History  Diagnosis Date  . HIV (human immunodeficiency virus infection) ([place])   . BPH (benign prostatic hypertrophy)   . Dyslipidemia   . Hypertension   . Hypothyroidism   . Schizoaffective disorder ([place])    History reviewed. No pertinent past surgical history. Family History:  Family History  Problem Relation Age of Onset  . Hypertension Other    Family Psychiatric  History: Aunt with schizophrenia. Social History:  History  Alcohol Use  . 1.2 - 1.8 oz/week  . 2-3 Cans of beer per week     History  Drug Use No    Social History   Social History  . Marital Status: Single    Spouse Name: N/A  . Number of Children: N/A  . Years of Education: N/A   Social History Main Topics  . Smoking status: Current Every Day Smoker -- 2.00 packs/day    Types: Cigarettes  . Smokeless  tobacco: None  . Alcohol Use: 1.2 - 1.8 oz/week    2-3 Cans of beer per week  . Drug Use: No  . Sexual Activity: Not Asked   Other Topics Concern  . None   Social History Narrative   Additional Social History:                         Sleep: Fair  Appetite:  Fair  Current Medications: Current Facility-Administered Medications  Medication Dose Route Frequency Provider Last Rate Last Dose  . acetaminophen (TYLENOL) tablet 650 mg  650 mg Oral Q6H PRN Gonzella Lex, MD   650 mg at 01/16/15 0548  . alum & mag hydroxide-simeth (MAALOX/MYLANTA) 200-200-20 MG/5ML suspension 30 mL  30 mL Oral Q4H PRN Gonzella Lex, MD   30 mL at 01/15/15 1837  . aspirin EC tablet 81 mg  81 mg Oral QAC breakfast Clovis Fredrickson, MD   81 mg at 01/30/15 0824  . benztropine (COGENTIN) tablet 2 mg  2 mg Oral QAC breakfast Clovis Fredrickson, MD   2 mg at 01/30/15 0824  . carvedilol (COREG) tablet 3.125 mg  3.125 mg Oral BID AC & HS Chenoah Mcnally B Tradarius Reinwald, MD   3.125 mg at 01/30/15 0824  . cholecalciferol (VITAMIN D) tablet 2,000 Units  2,000 Units Oral QAC breakfast Clovis Fredrickson, MD   2,000 Units at 01/30/15 0824  . desmopressin (DDAVP) tablet  0.2 mg  0.2 mg Oral QHS Clovis Fredrickson, MD   0.2 mg at 01/29/15 2205  . divalproex (DEPAKOTE ER) 24 hr tablet 1,000 mg  1,000 mg Oral QHS Clovis Fredrickson, MD   1,000 mg at 01/29/15 2205  . divalproex (DEPAKOTE) DR tablet 500 mg  500 mg Oral Q breakfast Clovis Fredrickson, MD   500 mg at 01/30/15 0824  . efavirenz-emtricitabine-tenofovir (ATRIPLA) 600-200-300 MG per tablet 1 tablet  1 tablet Oral QHS Gonzella Lex, MD   1 tablet at 01/29/15 2205  . fluPHENAZine decanoate (PROLIXIN) injection 50 mg  50 mg Intramuscular Q14 Days Kaneshia Cater B Brei Pociask, MD   50 mg at 01/29/15 0814  . hydrOXYzine (ATARAX/VISTARIL) tablet 50 mg  50 mg Oral QHS Clovis Fredrickson, MD   50 mg at 01/29/15 2205  . ibuprofen (ADVIL,MOTRIN) tablet 600 mg  600 mg  Oral Q6H PRN Shalondra Wunschel B Meron Bocchino, MD   600 mg at 01/16/15 1356  . iohexol (OMNIPAQUE) 240 MG/ML injection 50 mL  50 mL Oral Once PRN Medication Radiologist, MD      . levothyroxine (SYNTHROID, LEVOTHROID) tablet 25 mcg  25 mcg Oral QAC breakfast Gonzella Lex, MD   25 mcg at 01/30/15 0826  . lithium carbonate (ESKALITH) CR tablet 450 mg  450 mg Oral BID AC & HS Jaquesha Boroff B Lenin Kuhnle, MD   450 mg at 01/30/15 0824  . LORazepam (ATIVAN) tablet 2 mg  2 mg Oral QHS Clovis Fredrickson, MD   2 mg at 01/29/15 2211  . magnesium citrate solution 1 Bottle  1 Bottle Oral Once Clovis Fredrickson, MD   1 Bottle at 01/10/15 1432  . magnesium hydroxide (MILK OF MAGNESIA) suspension 30 mL  30 mL Oral Daily PRN Gonzella Lex, MD   30 mL at 01/28/15 2050  . paliperidone (INVEGA SUSTENNA) injection 234 mg  234 mg Intramuscular Q28 days Clovis Fredrickson, MD   234 mg at 01/29/15 0811  . promethazine (PHENERGAN) tablet 25 mg  25 mg Oral Q6H PRN Clovis Fredrickson, MD   25 mg at 01/15/15 1837  . QUEtiapine (SEROQUEL) tablet 100 mg  100 mg Oral QHS Clovis Fredrickson, MD   100 mg at 01/29/15 2206  . simvastatin (ZOCOR) tablet 20 mg  20 mg Oral QHS Clovis Fredrickson, MD   20 mg at 01/29/15 2205  . tamsulosin (FLOMAX) capsule 0.4 mg  0.4 mg Oral QHS Armistead Sult B Montoya Watkin, MD   0.4 mg at 01/29/15 2206  . terazosin (HYTRIN) capsule 1 mg  1 mg Oral QHS Gonzella Lex, MD   1 mg at 01/29/15 2206  . traZODone (DESYREL) tablet 300 mg  300 mg Oral QHS Clovis Fredrickson, MD   300 mg at 01/29/15 2205    Lab Results:  Results for orders placed or performed during the hospital encounter of 12/26/14 (from the past 48 hour(s))  Ammonia     Status: None   Collection Time: 01/28/15  2:44 PM  Result Value Ref Range   Ammonia 33 9 - 35 umol/L  CBC     Status: Abnormal   Collection Time: 01/29/15  7:59 AM  Result Value Ref Range   WBC 4.6 3.8 - 10.6 K/uL   RBC 4.04 (L) 4.40 - 5.90 MIL/uL   Hemoglobin 12.9 (L) 13.0  - 18.0 g/dL   HCT 39.1 (L) 40.0 - 52.0 %   MCV 96.9 80.0 - 100.0 fL   MCH  31.9 26.0 - 34.0 pg   MCHC 32.9 32.0 - 36.0 g/dL   RDW 14.2 11.5 - 14.5 %   Platelets 223 150 - 440 K/uL  Valproic acid level     Status: Abnormal   Collection Time: 01/29/15  7:59 AM  Result Value Ref Range   Valproic Acid Lvl 44 (L) 50.0 - 100.0 ug/mL  Comprehensive metabolic panel     Status: Abnormal   Collection Time: 01/29/15  7:59 AM  Result Value Ref Range   Sodium 135 135 - 145 mmol/L   Potassium 4.2 3.5 - 5.1 mmol/L   Chloride 104 101 - 111 mmol/L   CO2 27 22 - 32 mmol/L   Glucose, Bld 75 65 - 99 mg/dL   BUN 11 6 - 20 mg/dL   Creatinine, Ser 0.94 0.61 - 1.24 mg/dL   Calcium 8.5 (L) 8.9 - 10.3 mg/dL   Total Protein 6.2 (L) 6.5 - 8.1 g/dL   Albumin 3.4 (L) 3.5 - 5.0 g/dL   AST 13 (L) 15 - 41 U/L   ALT 7 (L) 17 - 63 U/L   Alkaline Phosphatase 81 38 - 126 U/L   Total Bilirubin 0.4 0.3 - 1.2 mg/dL   GFR calc non Af Amer >60 >60 mL/min   GFR calc Af Amer >60 >60 mL/min    Comment: (NOTE) The eGFR has been calculated using the CKD EPI equation. This calculation has not been validated in all clinical situations. eGFR's persistently <60 mL/min signify possible Chronic Kidney Disease.    Anion gap 4 (L) 5 - 15  Lipase, blood     Status: None   Collection Time: 01/29/15  7:59 AM  Result Value Ref Range   Lipase 23 11 - 51 U/L  Lithium level     Status: Abnormal   Collection Time: 01/29/15  8:04 AM  Result Value Ref Range   Lithium Lvl 0.36 (L) 0.60 - 1.20 mmol/L    Physical Findings: AIMS:  , ,  ,  ,    CIWA:    COWS:     Musculoskeletal: Strength & Muscle Tone: within normal limits Gait & Station: normal Patient leans: N/A  Psychiatric Specialty Exam: Review of Systems  Gastrointestinal: Positive for abdominal pain.  Neurological: Positive for tremors.  All other systems reviewed and are negative.   Blood pressure 110/81, pulse 80, temperature 97.5 F (36.4 C), temperature source  Oral, resp. rate 18, height 6' (1.829 m), weight 79.379 kg (175 lb), SpO2 99 %.Body mass index is 23.73 kg/(m^2).  General Appearance: Disheveled  Eye Contact::  Minimal  Speech:  Pressured  Volume:  Increased  Mood:  Angry, Dysphoric and Irritable  Affect:  Congruent  Thought Process:  Disorganized  Orientation:  Full (Time, Place, and Person)  Thought Content:  Delusions and Paranoid Ideation  Suicidal Thoughts:  Yes.  with intent/plan  Homicidal Thoughts:  Yes.  with intent/plan  Memory:  Immediate;   Fair Recent;   Fair Remote;   Fair  Judgement:  Poor  Insight:  Lacking  Psychomotor Activity:  Increased  Concentration:  Fair  Recall:  [employer] of Knowledge:Fair  Language: Fair  Akathisia:  No  Handed:  Right  AIMS (if indicated):     Assets:  Communication Skills Desire for Improvement Financial Resources/Insurance Resilience  ADL's:  Intact  Cognition: WNL  Sleep:  Number of Hours: 5   Treatment Plan Summary: Daily contact with patient to assess and evaluate symptoms and progress in  treatment and Medication management   [name] is a 54 year old male with long history of schizoaffective disorder admitted for agitated and threatening behavior at the group home in the context of partial medication compliance.  1. Agitation. [name] is agitated at times.  2. Mood and psychosis. He is maintained on injections of Prolixin and Invega sustenna for psychosis. He has been prescribe Lithium and Depakote for mood stabilization. Both lithium and VPA levels were subtherapeutic on 01/29/2015. Ammonia level was normal.  We increased lithium to 450 mg bid and depakote tp 1500 mg per day for mood stabilization. He has a history of high ammonia on Depakote. [name] initially agreed to take lithium under the condition that it will be given twice daily but now opposes it.   3. Hypothyroidism. Continue Synthroid. TSH is normal.  4. HIV. Continue Atripla.  5. Hypertension.  Continue antihypertensives. VS well controlled  6. Dyslipidemia. Continue Zocor. Lipid panel is not elevated.  7. BPH. We will continue Flomax and Terazosin.  8. Social. The patient is not allowed to return to his group home. Currently homeless. Unable to care for self without supervision.  9. Insomnia. Sleep continues to be poor in spite of treatment with Restoril, Ambien, Trazodone and Ativan. I will add Seroquel. I am aware that his is the third antipsychotic.   10. Constipation. He is on bowel regimen.   11. Inguinal hernia. Medicine input is greatly appreciated. He complains of pain not eased with Tylenol. We started Motrin.   12. Vomiting. We start Protonix. Chemistries are unremarkable.   13. Urinary incontinence. We started DDAVP.   14. Disposition. To be established. He can not be placed as he was refused [facility]. He is on wait list for [place].     Evangelyne Loja 01/30/2015, 1:53 PM

## 2015-01-30 NOTE — [facility] Group Notes (Signed)
[facility] LCSW Aftercare Discharge Planning Group Note   01/30/2015 5:02 PM  Participation Quality:  Invited. Did not attend   [name] L Laina Guerrieri MSW, LCSWA  

## 2015-01-30 NOTE — Progress Notes (Signed)
Recreation Therapy Notes  Date: 11.09.16 Time: 3:00 pm Location: Craft Room  Group Topic: Self-esteem  Goal Area(s) Addresses:  Patients will write at least one positive trait about self.  Behavioral Response: Did not attend  Intervention: I Am  Activity: Patients were given a worksheet with the letter I on it and instructed to list as many positive traits about themselves as they could.  Education: LRT educated patients on ways they can increase their self-esteem.  Education Outcome: Patient did not attend group.  Clinical Observations/Feedback: Patient did not attend group.  Jacquelynn CreeGreene,Gigi Onstad M, LRT/CTRS 01/30/2015 4:16 PM

## 2015-01-30 NOTE — [facility] Group Notes (Signed)
[facility] Group Notes:  (Nursing/MHT/Case Management/Adjunct)  Date:  01/30/2015  Time:  1:17 PM  Type of Therapy:  Psychoeducational Skills  Participation Level:  Did Not Attend   Darrow BussingMarly S Gabrella Stroh 01/30/2015, 1:17 PM

## 2015-01-30 NOTE — Progress Notes (Signed)
Intentionally loud, dominating conversation, was allowed to vent, refused to wear a wrist band, "I am not wearing it, you know me and everyone of you knows me; [facility] and [facility] will not [facility] ..." Ranting, thoughts disconnected.

## 2015-01-30 NOTE — [facility] Group Notes (Signed)
[facility] LCSW Group Therapy  01/30/2015 5:00 PM  Type of Therapy:  Group Therapy  Participation Level:  Did Not Attend   Modes of Intervention:  Discussion, Education, Socialization and Support  Summary of Progress/Problems: Emotional Regulation: Patients will identify both negative and positive emotions. They will discuss emotions they have difficulty regulating and how they impact their lives. Patients will be asked to identify healthy coping skills to combat unhealthy reactions to negative emotions.     [name] MSW, LCSWA  01/30/2015, 5:00 PM   

## 2015-01-31 MED ORDER — LITHIUM CARBONATE ER 450 MG PO TBCR: 450.0000 mg | EXTENDED_RELEASE_TABLET | Freq: Two times a day (BID) | ORAL | Status: AC

## 2015-01-31 MED ORDER — DIVALPROEX SODIUM ER 500 MG PO TB24: 1000.0000 mg | ORAL_TABLET | Freq: Every day | ORAL | Status: AC

## 2015-01-31 MED ORDER — EFAVIRENZ-EMTRICITAB-TENOFOVIR 600-200-300 MG PO TABS: 1.0000 | ORAL_TABLET | Freq: Every day | ORAL | Status: AC

## 2015-01-31 MED ORDER — PALIPERIDONE PALMITATE 234 MG/1.5ML IM SUSP: 234.0000 mg | INTRAMUSCULAR | Status: AC

## 2015-01-31 MED ORDER — DESMOPRESSIN ACETATE 0.2 MG PO TABS: 0.2000 mg | ORAL_TABLET | Freq: Every day | ORAL | Status: AC

## 2015-01-31 MED ORDER — QUETIAPINE FUMARATE 100 MG PO TABS: 100.0000 mg | ORAL_TABLET | Freq: Every day | ORAL | Status: AC

## 2015-01-31 MED ORDER — TRAZODONE HCL 300 MG PO TABS: 300.0000 mg | ORAL_TABLET | Freq: Every day | ORAL | Status: AC

## 2015-01-31 MED ORDER — BENZTROPINE MESYLATE 1 MG PO TABS: 2.0000 mg | ORAL_TABLET | Freq: Every day | ORAL | Status: AC

## 2015-01-31 MED ORDER — FLUPHENAZINE DECANOATE 25 MG/ML IJ SOLN: 50.0000 mg | INTRAMUSCULAR | Status: AC

## 2015-01-31 MED ORDER — LORAZEPAM 2 MG PO TABS: 2.0000 mg | ORAL_TABLET | Freq: Every day | ORAL | Status: AC

## 2015-01-31 MED ORDER — DIVALPROEX SODIUM 500 MG PO DR TAB: 500.0000 mg | DELAYED_RELEASE_TABLET | Freq: Every day | ORAL | Status: AC

## 2015-01-31 NOTE — [facility] Suicide Risk Assessment (Signed)
[facility]   Demographic Factors:  Male  Total Time spent with patient: 30 minutes  Musculoskeletal: Strength & Muscle Tone: within normal limits Gait & Station: normal Patient leans: N/A  Psychiatric Specialty Exam: Physical Exam  Nursing note and vitals reviewed.   Review of Systems  Gastrointestinal: Positive for abdominal pain.  All other systems reviewed and are negative.   Blood pressure 114/80, pulse 91, temperature 98.2 F (36.8 C), temperature source Oral, resp. rate 18, height 6' (1.829 m), weight 79.379 kg (175 lb), SpO2 96 %.Body mass index is 23.73 kg/(m^2).  General Appearance: Disheveled  Eye Solicitor::  Fair  Speech:  Pressured409  Volume:  Increased  Mood:  Angry, Dysphoric and Irritable  Affect:  Inappropriate and Labile  Thought Process:  Disorganized  Orientation:  Full (Time, Place, and Person)  Thought Content:  Delusions and Paranoid Ideation  Suicidal Thoughts:  Yes.  without intent/plan  Homicidal Thoughts:  Yes.  without intent/plan  Memory:  Immediate;   Fair Recent;   Fair Remote;   Fair  Judgement:  Poor  Insight:  Lacking  Psychomotor Activity:  EPS and Increased  Concentration:  Fair  Recall:  [employer] of Knowledge:Fair  Language: Fair  Akathisia:  No  Handed:  Right  AIMS (if indicated):     Assets:  Communication Skills Desire for Improvement Financial Resources/Insurance Resilience  Sleep:  Number of Hours: 6.3  Cognition: WNL  ADL's:  Intact   Have you used any form of tobacco in the last 30 days? (Cigarettes, Smokeless Tobacco, Cigars, and/or Pipes): Yes  Has this patient used any form of tobacco in the last 30 days? (Cigarettes, Smokeless Tobacco, Cigars, and/or Pipes) Yes, A prescription for an FDA-approved tobacco cessation medication was offered at discharge and the patient refused  Mental Status Per Nursing Assessment::   On Admission:     Current Mental Status by Physician: Suicide  ideation indicated by: Patient and Thoughts of violence towards others  Loss Factors: NA  Historical Factors: Impulsivity  Risk Reduction Factors:   Sense of responsibility to family  Continued Clinical Symptoms:  Schizophrenia:   Paranoid or undifferentiated type  Cognitive Features That Contribute To Risk:  Closed-mindedness    Suicide Risk:  Mild:  Suicidal ideation of limited frequency, intensity, duration, and specificity.  There are no identifiable plans, no associated intent, mild dysphoria and related symptoms, good self-control (both objective and subjective assessment), few other risk factors, and identifiable protective factors, including available and accessible social support.  Principal Problem: Schizoaffective disorder, bipolar type [facility]) Discharge Diagnoses:  Patient Active Problem List   Diagnosis Date Noted  . Scrotal pain [N50.82] 01/11/2015  . Tobacco use disorder [F17.200] 12/27/2014  . HIV positive (HCC) [Z21] 12/26/2014  . Hypertension [I10] 12/26/2014  . Hypothyroidism [E03.9] 12/26/2014  . Dyslipidemia [E78.5] 12/26/2014  . Schizoaffective disorder, bipolar type (HCC) [F25.0] 10/25/2014  . Alcohol abuse [F10.10] 10/25/2014      Plan Of Care/Follow-up recommendations:  Activity:  As tolerated. Diet:  Low sodium heart healthy. Other:  Keep follow-up appointments.  Is patient on multiple antipsychotic therapies at discharge:  Yes,   Do you recommend tapering to monotherapy for antipsychotics?  No   Has Patient had three or more failed trials of antipsychotic monotherapy by history:  Yes,   Antipsychotic medications that previously failed include:   1.  Haldol ., 2.  Prolixin. and 3.  Invega.  Recommended Plan for Multiple Antipsychotic Therapies: Additional reason(s) for  multiple antispychotic treatment:  Poor response to a single antipsychotic.    [name] 01/31/2015, 1:56 PM

## 2015-01-31 NOTE — [facility] Suicide Risk Assessment (Signed)
[facility] INPATIENT:  Family/Significant Other Suicide Prevention Education  Suicide Prevention Education:  Patient Refusal for Family/Significant Other Suicide Prevention Education: The patient [name] has refused to provide written consent for family/significant other to be provided Family/Significant Other Suicide Prevention Education during admission and/or prior to discharge.  Physician notified.  Lulu RidingIngle, Mamoru Takeshita T, MSW, LCSWA 01/31/2015, 4:04 PM
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
## 2015-01-31 NOTE — Plan of Care (Signed)
Problem: Ineffective individual coping Goal: LTG: Patient will report a decrease in negative feelings Outcome: Not Progressing Continue to want things his way , refusal of medications , limited insight

## 2015-01-31 NOTE — [facility] Group Notes (Signed)
[facility] Group Notes:  (Nursing/MHT/Case Management/Adjunct)  Date:  01/31/2015  Time:  1:46 PM  Type of Therapy:  Psychoeducational Skills  Participation Level:  Did Not Attend  [name] 01/31/2015, 1:46 PM
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
## 2015-01-31 NOTE — Progress Notes (Signed)
Recreation Therapy Notes  Date: 11.10.16 Time: 3:00 pm Location: Craft Room  Group Topic: Self-expression, Coping Skills  Goal Area(s) Addresses:  Patient will effectively use art as a means of self-expression. Patient will recognize positive benefit of self-expression. Patient will be able to identify one emotion experienced during group discussion. Patient will identify use of art as a coping skill.  Behavioral Response: Did not attend   Intervention: Two Faces of Me  Activity: Patients were given a blank face worksheet and instructed to draw a line down the middle of the worksheet. On one side, patients were instructed to draw how they felt when they were admitted to the hospital and on the other side they were instructed to draw how they want to feel when they are d/c from the hospital.  Education: LRT educated patients on how art is a good Associate Professorcoping skill.  Education Outcome: Patient did not attend group.  Clinical Observations/Feedback: Patient did not attend group.  Jacquelynn CreeGreene,Andreus Cure M, LRT/CTRS 01/31/2015 3:56 PM

## 2017-05-20 IMAGING — CT CT ABD-PELV W/ CM
2 of 4 series · 12 of 32 positions shown, 18 images · IV contrast (omnipaque)
Comparison: None.

CLINICAL DATA: Right inguinal/scrotal pain.  HIV.

EXAM:
CT ABDOMEN AND PELVIS WITH CONTRAST
TECHNIQUE: Multidetector CT imaging of the abdomen and pelvis was performed
using the standard protocol following bolus administration of
intravenous contrast.
CONTRAST:  100mL OMNIPAQUE IOHEXOL 300 MG/ML  SOLN

[Series 2: routine abd pel with · axial · 0.80mm/px · z∈[-469,-74]mm · 10 of 97 slices shown, 16 images (1 of 2)]
[im 9/97  soft-tissue]
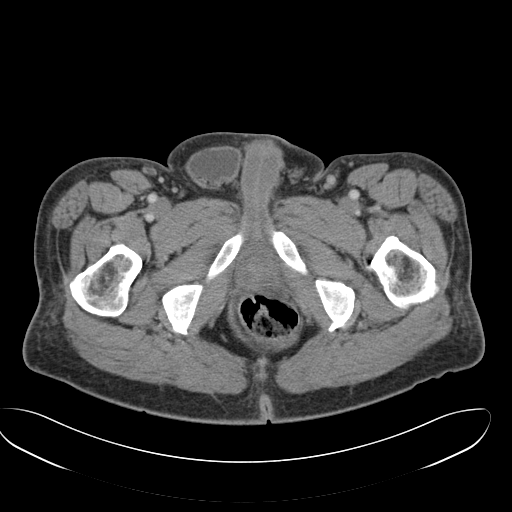
[im 9/97  bone]
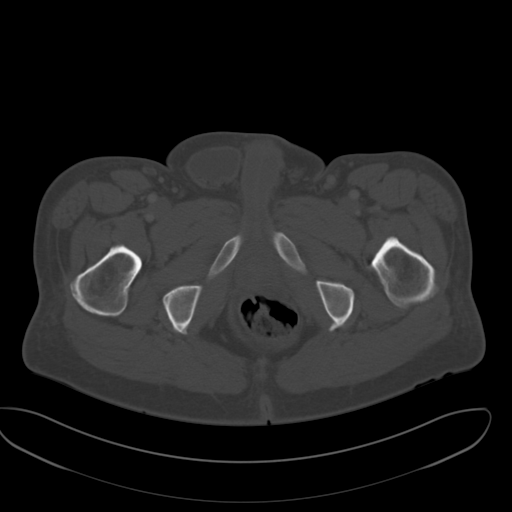
[im 18/97  soft-tissue]
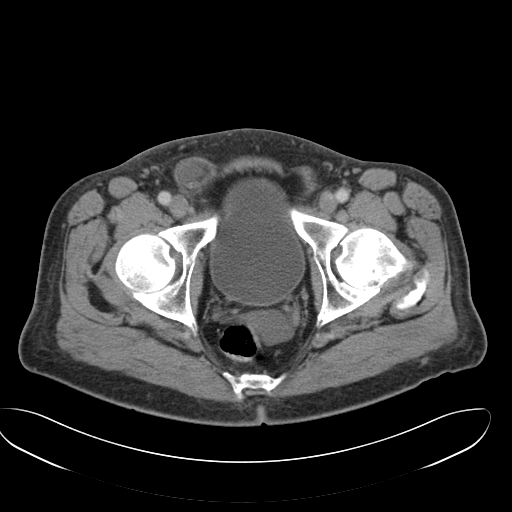
[im 27/97  soft-tissue]
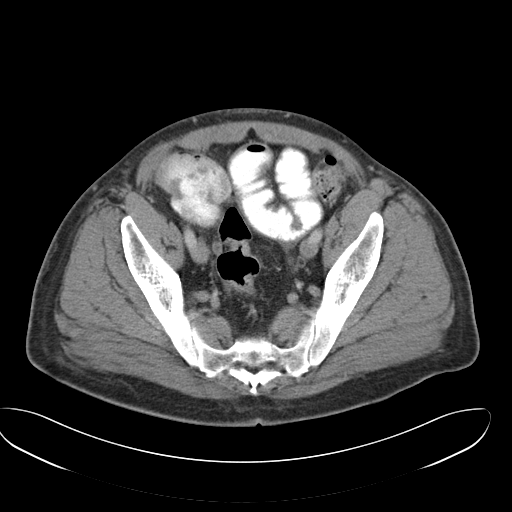
[im 35/97  soft-tissue]
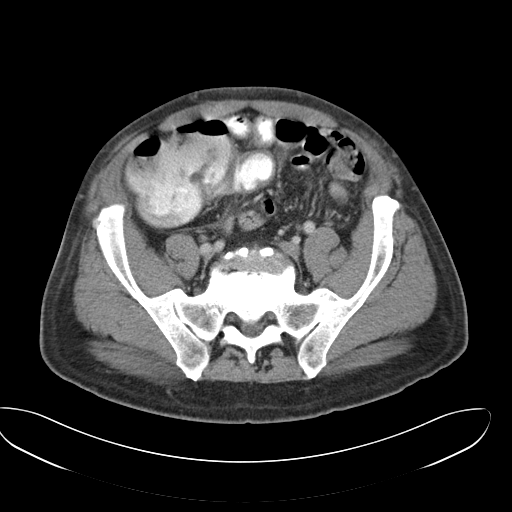
[im 44/97  soft-tissue]
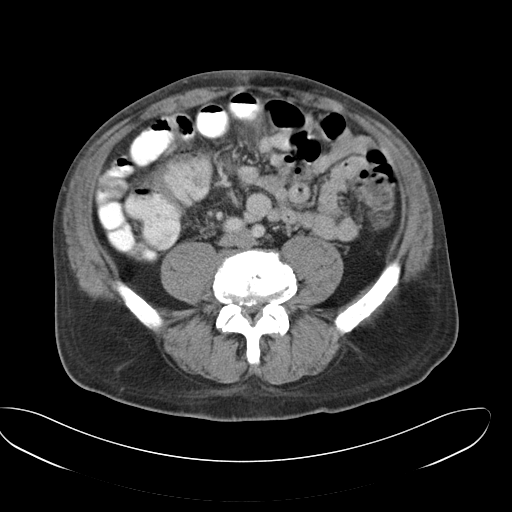
[im 53/97  soft-tissue]
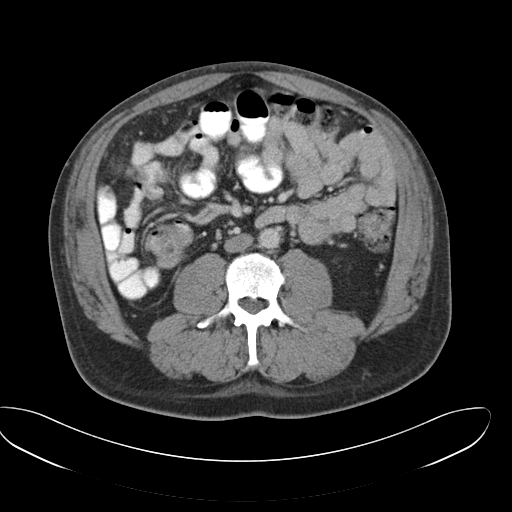
[im 62/97  soft-tissue]
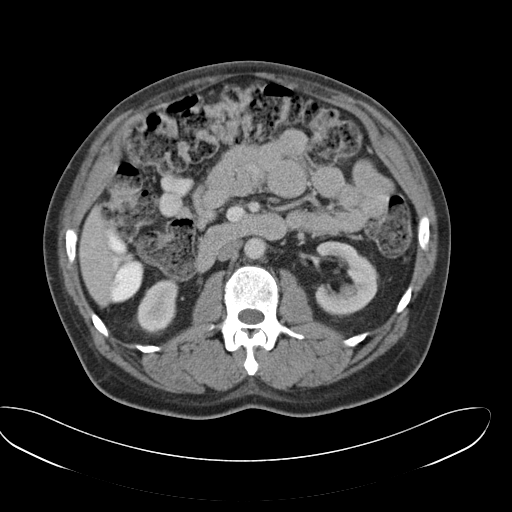
[im 62/97  lung]
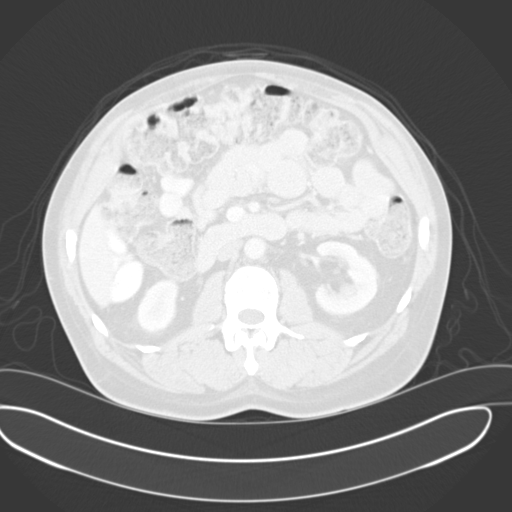
[im 70/97  soft-tissue]
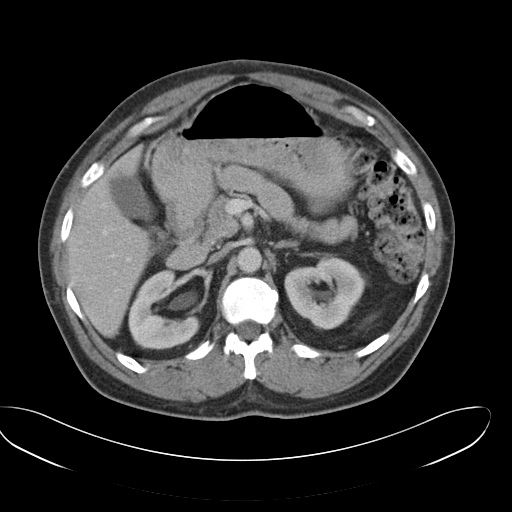
[im 70/97  lung]
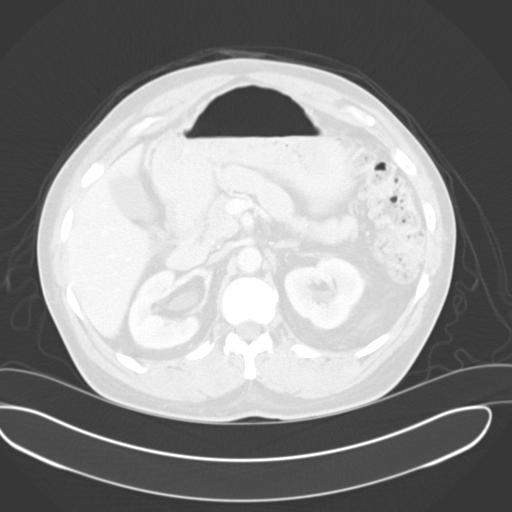
[im 79/97  soft-tissue]
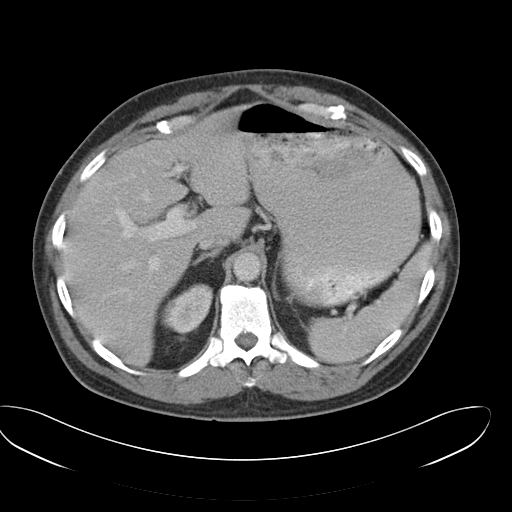
[im 79/97  lung]
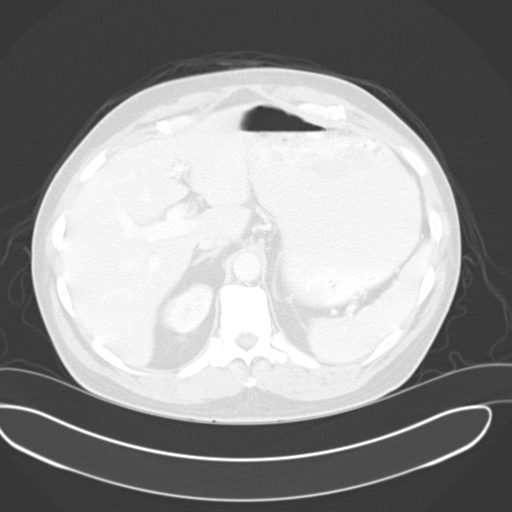
[im 79/97  bone]
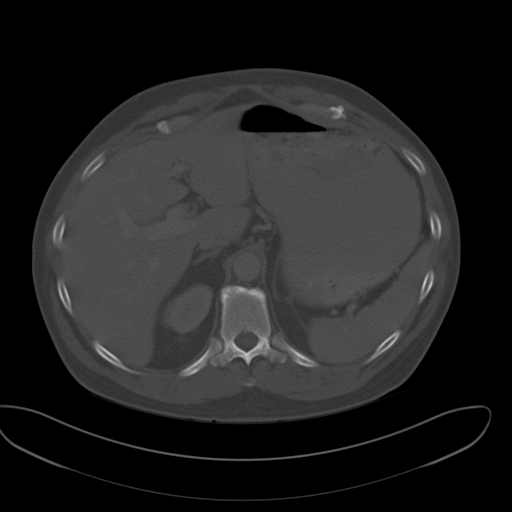
[im 88/97  soft-tissue]
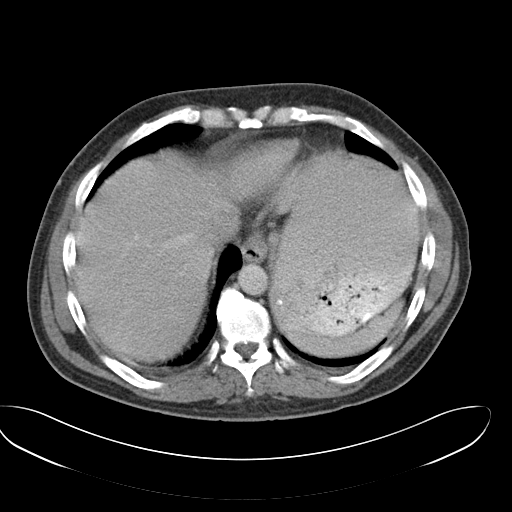
[im 88/97  lung]
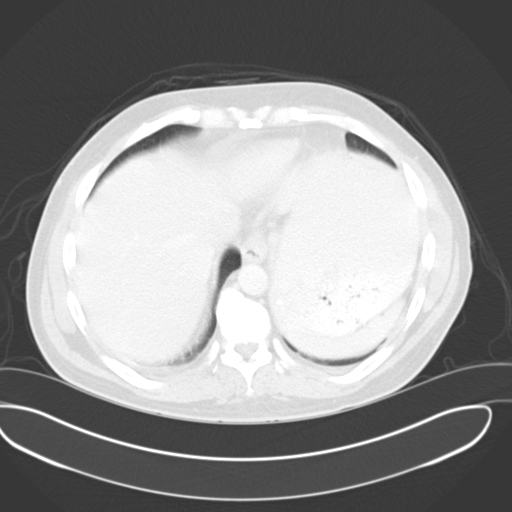

[Series 5: routine abd pel with · axial · 0.80mm/px · z∈[-554,-508]mm · 2 of 99 slices shown (2 of 2)]
[im 9/99  soft-tissue]
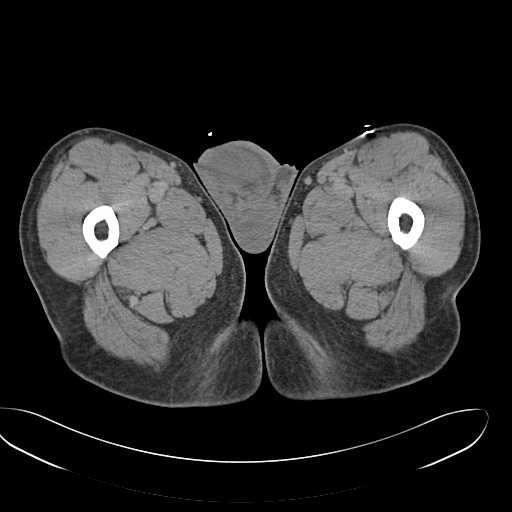
[im 18/99  soft-tissue]
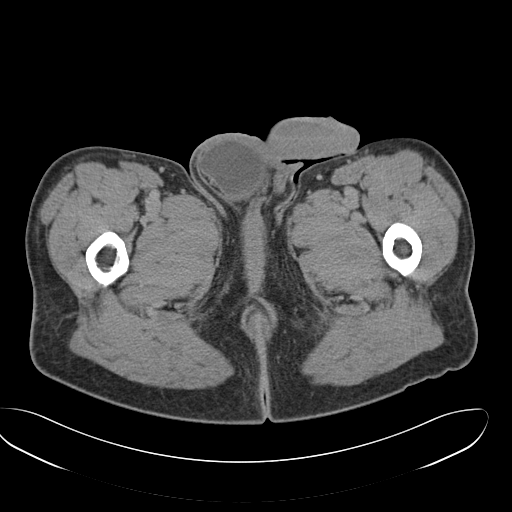

[12 of 32 positions shown; findings below may reference images not displayed]

FINDINGS: Lower chest: Trace layering bilateral pleural effusions.

Hepatobiliary: Normal liver with no liver mass. Normal gallbladder
with no radiopaque cholelithiasis. No biliary ductal dilatation.

Pancreas: Normal, with no mass or duct dilation.

Spleen: Normal size. No mass.

Adrenals/Urinary Tract: Normal adrenals. Mild fullness of the right
renal collecting system without overt right hydronephrosis. No left
hydronephrosis. Suggestion of a nonobstructing 2 mm lower left renal
stone. Exophytic simple 1.6 cm renal cyst in the upper left kidney.
Subcentimeter hypodense anterior upper right renal lesion, too small
to characterize. Normal bladder.

Stomach/Bowel: Grossly normal fluid and debris filled stomach.
Normal caliber small bowel with no small bowel wall thickening.
Normal appendix. There is focal colonic narrowing and apparent wall
thickening at the junction of the splenic flexure and transverse
colon (series 2/ image 42), which could be artifactual due to
underdistention/peristalsis, however a colonic mass cannot be
excluded. Otherwise normal large bowel.

Vascular/Lymphatic: Normal caliber abdominal aorta. Patent portal,
splenic, hepatic and renal veins. No pathologically enlarged lymph
nodes in the abdomen or pelvis.

Reproductive:  Top normal size prostate.

Other: No pneumoperitoneum, ascites or focal fluid collection.

Musculoskeletal: No aggressive appearing focal osseous lesions.
Severe degenerative disc disease is present at L5-S1. Mild to
moderate degenerative disc disease is present in the remaining
lumbar spine. There is a moderate right inguinal hernia containing
simple density fluid.
IMPRESSION: 1. Moderate right inguinal hernia containing simple density fluid.
2. No evidence of bowel obstruction or acute bowel inflammation.
Normal appendix.
3. Focal colonic narrowing and apparent wall thickening at the
junction of the splenic flexure and transverse colon, cannot exclude
a colonic mass. Recommend correlation with colonoscopy.
4. Trace layering bilateral pleural effusions.
5. Possible nonobstructing 2 mm lower left renal stone. No
hydronephrosis.
